# Patient Record
Sex: Female | Born: 1955 | Race: Black or African American | Hispanic: No | Marital: Single | Smoking: Never smoker
Health system: Southern US, Community
[De-identification: ages and names within clinical notes are randomized; demographics above are authoritative.]

## PROBLEM LIST (undated history)

## (undated) DIAGNOSIS — D219 Benign neoplasm of connective and other soft tissue, unspecified: Secondary | ICD-10-CM

## (undated) DIAGNOSIS — R7303 Prediabetes: Secondary | ICD-10-CM

## (undated) DIAGNOSIS — E785 Hyperlipidemia, unspecified: Secondary | ICD-10-CM

## (undated) DIAGNOSIS — B009 Herpesviral infection, unspecified: Secondary | ICD-10-CM

## (undated) DIAGNOSIS — R32 Unspecified urinary incontinence: Secondary | ICD-10-CM

## (undated) DIAGNOSIS — R42 Dizziness and giddiness: Secondary | ICD-10-CM

## (undated) DIAGNOSIS — N813 Complete uterovaginal prolapse: Secondary | ICD-10-CM

## (undated) DIAGNOSIS — R350 Frequency of micturition: Secondary | ICD-10-CM

## (undated) DIAGNOSIS — Z86010 Personal history of colonic polyps: Secondary | ICD-10-CM

## (undated) DIAGNOSIS — E119 Type 2 diabetes mellitus without complications: Secondary | ICD-10-CM

## (undated) DIAGNOSIS — N898 Other specified noninflammatory disorders of vagina: Secondary | ICD-10-CM

## (undated) HISTORY — DX: Benign neoplasm of connective and other soft tissue, unspecified: D21.9

## (undated) HISTORY — DX: Other specified noninflammatory disorders of vagina: N89.8

## (undated) HISTORY — PX: BREAST BIOPSY: SHX20

## (undated) HISTORY — DX: Prediabetes: R73.03

## (undated) HISTORY — DX: Hyperlipidemia, unspecified: E78.5

## (undated) HISTORY — PX: TUBAL LIGATION: SHX77

## (undated) HISTORY — DX: Dizziness and giddiness: R42

## (undated) HISTORY — DX: Complete uterovaginal prolapse: N81.3

## (undated) HISTORY — DX: Frequency of micturition: R35.0

## (undated) HISTORY — DX: Personal history of colonic polyps: Z86.010

## (undated) HISTORY — PX: WISDOM TOOTH EXTRACTION: SHX21

## (undated) HISTORY — DX: Herpesviral infection, unspecified: B00.9

## (undated) HISTORY — DX: Unspecified urinary incontinence: R32

---

## 2001-09-16 ENCOUNTER — Other Ambulatory Visit: Admission: RE | Admit: 2001-09-16 | Discharge: 2001-09-16 | Payer: Self-pay | Admitting: Obstetrics and Gynecology

## 2008-09-09 ENCOUNTER — Ambulatory Visit (HOSPITAL_COMMUNITY): Admission: RE | Admit: 2008-09-09 | Discharge: 2008-09-09 | Payer: Self-pay | Admitting: Family Medicine

## 2009-10-20 ENCOUNTER — Encounter: Payer: Self-pay | Admitting: Gastroenterology

## 2010-03-16 ENCOUNTER — Encounter: Payer: Self-pay | Admitting: Cardiology

## 2010-05-04 ENCOUNTER — Other Ambulatory Visit: Admission: RE | Admit: 2010-05-04 | Discharge: 2010-05-04 | Payer: Self-pay | Admitting: Obstetrics and Gynecology

## 2010-05-18 ENCOUNTER — Ambulatory Visit: Payer: Self-pay | Admitting: Cardiology

## 2010-05-18 DIAGNOSIS — R072 Precordial pain: Secondary | ICD-10-CM | POA: Insufficient documentation

## 2010-05-18 DIAGNOSIS — R9431 Abnormal electrocardiogram [ECG] [EKG]: Secondary | ICD-10-CM | POA: Insufficient documentation

## 2010-05-18 DIAGNOSIS — E785 Hyperlipidemia, unspecified: Secondary | ICD-10-CM | POA: Insufficient documentation

## 2010-05-30 ENCOUNTER — Telehealth (INDEPENDENT_AMBULATORY_CARE_PROVIDER_SITE_OTHER): Payer: Self-pay

## 2010-05-31 ENCOUNTER — Encounter (HOSPITAL_COMMUNITY)
Admission: RE | Admit: 2010-05-31 | Discharge: 2010-07-26 | Payer: Self-pay | Source: Home / Self Care | Attending: Cardiology | Admitting: Cardiology

## 2010-05-31 ENCOUNTER — Ambulatory Visit: Payer: Self-pay

## 2010-05-31 ENCOUNTER — Encounter: Payer: Self-pay | Admitting: Cardiology

## 2010-05-31 ENCOUNTER — Encounter: Payer: Self-pay | Admitting: *Deleted

## 2010-07-17 ENCOUNTER — Encounter: Payer: Self-pay | Admitting: Obstetrics and Gynecology

## 2010-07-26 NOTE — Assessment & Plan Note (Signed)
Summary: [facility]   Visit Type:  Initial Consult Primary Provider:  Dr. Tanya Nones  CC:  Abnormal ETT.  History of Present Illness: The patient presents for evaluation of an abnormal exercise treadmill test. She had this in late September to followup on dyspnea and chest discomfort. She has been getting this for about a month. She reports discomfort walking the 26 stairs up to the restroom at work.  She we'll get some mild 3/10 chest heaviness without radiation. She will be short of breath. She recovered totally. She says it has been a stable pattern for one year. She does not get nausea or vomiting or excessive diaphoresis. She does not have symptoms at rest. She denies any palpitations, presyncope or syncope.  Exercise test performed I did review. He has somewhat poor exercise tolerance but she did exercise for  6 minuteis 6 seconds.  She no ST-T during exercise but did develop diffuse T-wave inversions in recovery. There were no reported symptoms.  Current Medications (verified): 1)  Aspirin 81 Mg  Tabs (Aspirin) .Marland Kitchen.. 1 By Mouth Daily  Allergies (verified): No Known Drug Allergies  Past History:  Past Medical History: Hyperlipidemia.  Past Surgical History: Tubal ligation Breast biopsy  Family History: Her mother died at age 58 with complicaitons of HF Her father died of an aneurysm though she knows no details.  Social History: She is single. Works at [employer] Never smoked.  Review of Systems       As stated in the HPI and negative for all other systems.   Vital Signs:  Patient profile:   55 year old female Height:      65 inches Weight:      194 pounds BMI:     32.40 Pulse rate:   55 / minute Resp:     16 per minute BP sitting:   122 / 82  (right arm)  Vitals Entered By: Marrion Coy, CNA (May 18, 2010 9:29 AM)  Physical Exam  General:  Well developed, well nourished, in no acute distress. Head:  normocephalic and atraumatic Eyes:  PERRLA/EOM  intact; conjunctiva and lids normal. Mouth:  Teeth, gums and palate normal. Oral mucosa normal. Neck:  Neck supple, no JVD. No masses, thyromegaly or abnormal cervical nodes. Chest Fisher:  no deformities or breast masses noted Lungs:  Clear bilaterally to auscultation and percussion. Abdomen:  Bowel sounds positive; abdomen soft and non-tender without masses, organomegaly, or hernias noted. No hepatosplenomegaly. Msk:  Back normal, normal gait. Muscle strength and tone normal. Extremities:  No clubbing or cyanosis. Neurologic:  Alert and oriented x 3. Skin:  Intact without lesions or rashes. Cervical Nodes:  no significant adenopathy Axillary Nodes:  no significant adenopathy Inguinal Nodes:  no significant adenopathy Psych:  Normal affect.   Detailed Cardiovascular Exam  Neck    Carotids: Carotids full and equal bilaterally without bruits.      Neck Veins: Normal, no JVD.    Heart    Inspection: no deformities or lifts noted.      Palpation: normal PMI with no thrills palpable.      Auscultation: regular rate and rhythm, S1, S2 without murmurs, rubs, gallops, or clicks.    Vascular    Abdominal Aorta: no palpable masses, pulsations, or audible bruits.      Femoral Pulses: normal femoral pulses bilaterally.      Pedal Pulses: normal pedal pulses bilaterally.      Radial Pulses: normal radial pulses bilaterally.      Peripheral  Circulation: no clubbing, cyanosis, or edema noted with normal capillary refill.     EKG  Procedure date:  03/16/2010  Findings:      Sinus bradycardia, rate 56, axis within normal limits, intervals within normal limits, nonspecific biphasic T waves  Impression & Recommendations:  Problem # 1:  ABNORMAL STRESS ELECTROCARDIOGRAM (ICD-794.31) The patient had an abnormal stress test though I would interpret this as indeterminate with the baseline EKG abnormalities and absence of ST segment depression. It could not exclude obstructive coronary disease.  The pretest probability is moderately high. Therefore, stress perfusion imaging is indicated.  Problem # 2:  DYSLIPIDEMIA (ICD-272.4) I review recent lipid with an LDL of 171 and an HDL of 49. Triglycerides were 159. She has not been taking the prescribed statin though she was told to. I agree with this therapy and episodically and portions of this and diet.  Problem # 3:  OVERWEIGHT (ICD-278.02) We discussed the need for weight loss and I prescribed the [facility] Diet  Other Orders: Nuclear Stress Test (Nuc Stress Test)  Patient Instructions: 1)  Your physician recommends that you schedule a follow-up appointment as needed 2)  Your physician recommends that you continue on your current medications as directed. Please refer to the Current Medication list given to you today. 3)  Your physician has requested that you have an exercise stress myoview.  For further information please visit [email or web address]  Please follow instruction sheet, as given.  You will be called with these results and cardiac clearance will be based on the results.

## 2010-07-26 NOTE — Progress Notes (Signed)
Summary: Nuc. Pre-Procedure  Phone Note Outgoing Call Call back at [facility] Phone [phone]   Call placed by: Irean Hong, RN,  May 30, 2010 4:10 PM Summary of Call: Left message with information on Myoview Information Sheet (see scanned document for details).      Nuclear Med Background Indications for Stress Test: Evaluation for Ischemia   History: GXT  History Comments: 9/11 GXT: Abnormal with diffuse T-wave changes in recovery.  Symptoms: Chest Pain with Exertion, DOE, SOB    Nuclear Pre-Procedure Cardiac Risk Factors: Lipids Height (in): 65

## 2010-07-26 NOTE — Letter (Signed)
Summary: Pre Visit No Show Letter  [facility]  [address] [place], [place] [address]   Phone: [phone]  Fax: [phone]        October 20, 2009 MRN: [id number]    [facility] [address] [place], [place]  [address]    Dear Ms. Skillin,   We have been unable to reach you by phone concerning the pre-procedure visit that you missed on 10/20/09. For this reason,your procedure scheduled on 11/01/09 has been cancelled. Our scheduling staff will gladly assist you with rescheduling your appointments at a more convenient time. Please call our office at [phone] between the hours of 8:00am and 5:00pm, press option #2 to reach an appointment scheduler. Please consider updating your contact numbers at this time so that we can reach you by phone in the future with schedule changes or results.    Thank you,    Wyona Almas RN [facility]

## 2010-07-26 NOTE — Assessment & Plan Note (Signed)
Summary: Cardiology Nuclear Testing  Nuclear Med Background Indications for Stress Test: Evaluation for Ischemia   History: GXT  History Comments: 9/11 GXT: Abnormal with diffuse T-wave changes in recovery.  Symptoms: Chest Pain with Exertion, DOE, Palpitations, SOB    Nuclear Pre-Procedure Cardiac Risk Factors: Lipids Caffeine/Decaff Intake: None NPO After: 10:00 PM Lungs: clear IV 0.9% NS with Angio Cath: 22g     IV Site: R Hand IV Started by: Irean Hong, RN Chest Size (in) 40     Cup Size C     Height (in): 65 Weight (lb): 191 BMI: 31.90  Nuclear Med Study 1 or 2 day study:  1 day     Stress Test Type:  Stress Reading MD:  Cassell Clement, MD     Referring MD:  J.Hochrein Resting Radionuclide:  Technetium 43m Tetrofosmin     Resting Radionuclide Dose:  11 mCi  Stress Radionuclide:  Technetium 54m Tetrofosmin     Stress Radionuclide Dose:  33 mCi   Stress Protocol Exercise Time (min):  8:01 min     Max HR:  144 bpm     Predicted Max HR:  167 bpm  Max Systolic BP: 172 mm Hg     Percent Max HR:  86.23 %     METS: 10.10 Rate Pressure Product:  95284    Stress Test Technologist:  Milana Na, EMT-P     Nuclear Technologist:  Doyne Keel, CNMT  Rest Procedure  Myocardial perfusion imaging was performed at rest 45 minutes following the intravenous administration of Technetium 54m Tetrofosmin.  Stress Procedure  The patient exercised for 8:01. The patient stopped due to fatigue, sob, and denied any chest pain.  There were non specific ST-T wave changes and rare pacs/pat/pvc.  Technetium 61m Tetrofosmin was injected at peak exercise and myocardial perfusion imaging was performed after a brief delay.  QPS Raw Data Images:  Normal; no motion artifact; normal heart/lung ratio. Stress Images:  Normal homogeneous uptake in all areas of the myocardium. Rest Images:  Normal homogeneous uptake in all areas of the myocardium. Subtraction (SDS):  No evidence of ischemia.   Normal apical thinning on stress and rest. Transient Ischemic Dilatation:  1.09  (Normal <1.22)  Lung/Heart Ratio:  .36  (Normal <0.45)  Quantitative Gated Spect Images QGS EDV:  111 ml QGS ESV:  46 ml QGS EF:  58 % QGS cine images:  No Steers motion abnormalities.  Findings Normal nuclear study      Overall Impression  Exercise Capacity: Good exercise capacity. BP Response: Normal blood pressure response. Clinical Symptoms: No chest pain.  Dyspnea. ECG Impression: No significant ST segment change suggestive of ischemia.  One run of PAT  Overall Impression: Normal stress nuclear study.  Appended Document: Cardiology Nuclear Testing No further cardiac work up.  Appended Document: Cardiology Nuclear Testing left messge of normal results and to call back with any questions

## 2011-02-06 ENCOUNTER — Encounter: Payer: Self-pay | Admitting: Cardiology

## 2011-05-22 ENCOUNTER — Telehealth (INDEPENDENT_AMBULATORY_CARE_PROVIDER_SITE_OTHER): Payer: Self-pay | Admitting: *Deleted

## 2011-05-22 NOTE — Telephone Encounter (Signed)
Wrong patient

## 2011-10-19 ENCOUNTER — Other Ambulatory Visit: Payer: Self-pay | Admitting: Family Medicine

## 2011-10-19 DIAGNOSIS — R52 Pain, unspecified: Secondary | ICD-10-CM

## 2011-11-01 ENCOUNTER — Other Ambulatory Visit: Payer: Self-pay | Admitting: Family Medicine

## 2011-11-01 ENCOUNTER — Ambulatory Visit (HOSPITAL_COMMUNITY)
Admission: RE | Admit: 2011-11-01 | Discharge: 2011-11-01 | Disposition: A | Discharge status: Home / Self Care | Payer: BC Managed Care – PPO | Source: Ambulatory Visit | Attending: Family Medicine | Admitting: Family Medicine

## 2011-11-01 DIAGNOSIS — R52 Pain, unspecified: Secondary | ICD-10-CM

## 2011-11-01 DIAGNOSIS — Z1231 Encounter for screening mammogram for malignant neoplasm of breast: Secondary | ICD-10-CM | POA: Insufficient documentation

## 2014-06-22 ENCOUNTER — Ambulatory Visit: Payer: BC Managed Care – PPO | Admitting: Orthopedic Surgery

## 2014-10-20 ENCOUNTER — Other Ambulatory Visit: Payer: Self-pay | Admitting: Adult Health

## 2014-10-21 ENCOUNTER — Encounter: Payer: Self-pay | Admitting: Family

## 2014-10-28 ENCOUNTER — Other Ambulatory Visit (HOSPITAL_COMMUNITY)
Admission: RE | Admit: 2014-10-28 | Discharge: 2014-10-28 | Disposition: A | Discharge status: Home / Self Care | Payer: BLUE CROSS/BLUE SHIELD | Source: Ambulatory Visit | Attending: Adult Health | Admitting: Adult Health

## 2014-10-28 ENCOUNTER — Ambulatory Visit (INDEPENDENT_AMBULATORY_CARE_PROVIDER_SITE_OTHER): Payer: BLUE CROSS/BLUE SHIELD | Admitting: Adult Health

## 2014-10-28 ENCOUNTER — Encounter: Payer: Self-pay | Admitting: Adult Health

## 2014-10-28 VITALS — BP 120/80 | HR 56 | Ht 65.25 in | Wt 195.0 lb

## 2014-10-28 DIAGNOSIS — R350 Frequency of micturition: Secondary | ICD-10-CM

## 2014-10-28 DIAGNOSIS — Z1212 Encounter for screening for malignant neoplasm of rectum: Secondary | ICD-10-CM | POA: Diagnosis not present

## 2014-10-28 DIAGNOSIS — R32 Unspecified urinary incontinence: Secondary | ICD-10-CM

## 2014-10-28 DIAGNOSIS — Z01419 Encounter for gynecological examination (general) (routine) without abnormal findings: Secondary | ICD-10-CM

## 2014-10-28 DIAGNOSIS — L298 Other pruritus: Secondary | ICD-10-CM | POA: Diagnosis not present

## 2014-10-28 DIAGNOSIS — N813 Complete uterovaginal prolapse: Secondary | ICD-10-CM

## 2014-10-28 DIAGNOSIS — N898 Other specified noninflammatory disorders of vagina: Secondary | ICD-10-CM

## 2014-10-28 DIAGNOSIS — R319 Hematuria, unspecified: Secondary | ICD-10-CM

## 2014-10-28 DIAGNOSIS — N3946 Mixed incontinence: Secondary | ICD-10-CM | POA: Diagnosis not present

## 2014-10-28 DIAGNOSIS — Z1151 Encounter for screening for human papillomavirus (HPV): Secondary | ICD-10-CM | POA: Insufficient documentation

## 2014-10-28 HISTORY — DX: Unspecified urinary incontinence: R32

## 2014-10-28 HISTORY — DX: Other specified noninflammatory disorders of vagina: N89.8

## 2014-10-28 HISTORY — DX: Frequency of micturition: R35.0

## 2014-10-28 HISTORY — DX: Complete uterovaginal prolapse: N81.3

## 2014-10-28 LAB — POCT URINALYSIS DIPSTICK
GLUCOSE UA: NEGATIVE
LEUKOCYTES UA: NEGATIVE
NITRITE UA: NEGATIVE
Protein, UA: NEGATIVE

## 2014-10-28 LAB — HEMOCCULT GUIAC POC 1CARD (OFFICE): Fecal Occult Blood, POC: NEGATIVE

## 2014-10-28 MED ORDER — NYSTATIN-TRIAMCINOLONE 100000-0.1 UNIT/GM-% EX OINT: 1.0000 "application " | TOPICAL_OINTMENT | Freq: Two times a day (BID) | CUTANEOUS | Status: DC

## 2014-10-28 NOTE — Progress Notes (Signed)
Patient ID: Heather Leach, female   DOB: 11/26/1955, 59 y.o.   MRN: [id number] History of Present Illness: Heather Leach is a 59 year old black female in for well woman gyn exam with pap and complains of uterus coming out and urinary frequency and urinary incontinence, has pressure.Has vaginal itch near clitoris. PCP is Dr Laurance Flatten in [place].  Current Medications, Allergies, Past Medical History, Past Surgical History, Family History and Social History were reviewed in [employer] record.     Review of Systems: Patient denies any headaches, hearing loss, fatigue, blurred vision, shortness of breath, chest pain, abdominal pain, problems with bowel movements. No joint pain or mood swings.See HPI for positives, she is not had sex since 2008.She went to dentist last week needs root canal.    Physical Exam:BP 120/80 mmHg  Pulse 56  Ht 5' 5.25" (1.657 m)  Wt 195 lb (88.451 kg)  BMI 32.21 kg/m2urine dipstick trace blood  BP was 160/90 on arrival. General:  Well developed, well nourished, no acute distress Skin:  Warm and dry Neck:  Midline trachea, normal thyroid, good ROM, no lymphadenopathy Lungs; Clear to auscultation bilaterally Breast:  No dominant palpable mass, retraction, or nipple discharge Cardiovascular: Regular rate and rhythm Abdomen:  Soft, non tender, no hepatosplenomegaly Pelvic:  External genitalia is normal in appearance, no lesions.  The vagina has decreased color, moisture and rugae and uterus has completely prolapsed. Urethra has no lesions or masses. The cervix is smooth and dry, pap with HPV performed.  Uterus is felt to be normal size, shape, and contour.  No adnexal masses or tenderness noted.Bladder is non tender, no masses felt. Rectal: Good sphincter tone, no polyps, or hemorrhoids felt.  Hemoccult negative. Extremities/musculoskeletal:  No swelling or varicosities noted, no clubbing or cyanosis Psych:  No mood changes, alert and cooperative,seems happy I  dicussed with her I don't think pessary will help will probably need hysterectomy.  Impression: Well woman gyn exam with pap Complete uterine prolapse Urinary frequency Urinary incontinence, mixed Vaginal itch Hematuria    Plan: Rx mycolog ointment use bid prn to area that itches UA C&S sent Return in 1 week to talk surgery with Dr Elonda Husky Physical in 1 year Mammogram yearly Colonoscopy advised Labs with PCP

## 2014-10-28 NOTE — Patient Instructions (Signed)
Physical in 1 year Mammogram yearly Labs with PCP Colonoscopy advised

## 2014-10-29 LAB — URINALYSIS, ROUTINE W REFLEX MICROSCOPIC
Bilirubin, UA: NEGATIVE
Glucose, UA: NEGATIVE
Ketones, UA: NEGATIVE
LEUKOCYTES UA: NEGATIVE
NITRITE UA: NEGATIVE
PH UA: 6 (ref 5.0–7.5)
Protein, UA: NEGATIVE
RBC, UA: NEGATIVE
SPEC GRAV UA: 1.023 (ref 1.005–1.030)
Urobilinogen, Ur: 0.2 mg/dL (ref 0.2–1.0)

## 2014-10-29 LAB — CYTOLOGY - PAP

## 2014-10-30 LAB — URINE CULTURE

## 2014-11-06 ENCOUNTER — Ambulatory Visit: Payer: BLUE CROSS/BLUE SHIELD | Admitting: Obstetrics & Gynecology

## 2014-11-10 ENCOUNTER — Encounter: Payer: Self-pay | Admitting: Family

## 2014-11-12 ENCOUNTER — Encounter: Payer: Self-pay | Admitting: Physician Assistant

## 2014-11-12 ENCOUNTER — Ambulatory Visit (INDEPENDENT_AMBULATORY_CARE_PROVIDER_SITE_OTHER): Payer: BLUE CROSS/BLUE SHIELD | Admitting: Physician Assistant

## 2014-11-12 VITALS — BP 126/76 | HR 52 | Temp 97.6°F | Ht 65.25 in | Wt 196.6 lb

## 2014-11-12 DIAGNOSIS — Z0000 Encounter for general adult medical examination without abnormal findings: Secondary | ICD-10-CM

## 2014-11-12 NOTE — Progress Notes (Signed)
Subjective:     Patient ID: Heather Leach, female   DOB: [birth year], 59 y.o.   MRN: [id number]  HPI Pt here for PE She has regular pap and breast exam through her Gyn and this was done earlier this month Pt denies having any complaints at this time She has intermit problems with knee pain which has been told was arthritis Pt is on no regular meds OTC or RX  Review of Systems  Constitutional: Negative.   HENT: Negative.   Eyes: Negative.   Respiratory: Negative.   Cardiovascular: Negative.   Gastrointestinal: Negative.   Endocrine: Negative.   Genitourinary: Negative.   Neurological: Negative.   Hematological: Negative.   Psychiatric/Behavioral: Negative.        Objective:   Physical Exam  Constitutional: She is oriented to person, place, and time. She appears well-developed and well-nourished.  HENT:  Head: Normocephalic and atraumatic.  Right Ear: External ear normal.  Left Ear: External ear normal.  Mouth/Throat: Oropharynx is clear and moist. No oropharyngeal exudate.  Eyes: Conjunctivae and EOM are normal. Pupils are equal, round, and reactive to light.  Neck: Normal range of motion. Neck supple. No JVD present. No tracheal deviation present. No thyromegaly present.  No bruits  Cardiovascular: Normal rate, regular rhythm, normal heart sounds and intact distal pulses.   No murmur heard. Pulmonary/Chest: Effort normal and breath sounds normal.  Abdominal: Soft. Bowel sounds are normal. She exhibits no distension and no mass. There is no tenderness.  Musculoskeletal: Normal range of motion. She exhibits no edema or tenderness.  Lymphadenopathy:    She has no cervical adenopathy.  Neurological: She is alert and oriented to person, place, and time. She has normal reflexes.  Skin: Skin is warm and dry.  Psychiatric: She has a normal mood and affect. Her behavior is normal. Judgment and thought content normal.  Nursing note and vitals reviewed.      Assessment:      Physical exam - Plan: [id number], Lipid panel      Plan:     Will inform of lab results Discussed diet and increasing her exercise besides just at work Reviewed S/S of loss of BS control given strong FH She is overdue for colonoscopy but wants to wait until after her hysterectomy scheduled for this yr Pt needing eye exam Dental visits are up to date She has regular breast and gyn screening through her gyn

## 2014-11-12 NOTE — Patient Instructions (Signed)
Preventive Care for Adults A healthy lifestyle and preventive care can promote health and wellness. Preventive health guidelines for women include the following key practices.  A routine yearly physical is a good way to check with your health care provider about your health and preventive screening. It is a chance to share any concerns and updates on your health and to receive a thorough exam.  Visit your dentist for a routine exam and preventive care every 6 months. Brush your teeth twice a day and floss once a day. Good oral hygiene prevents tooth decay and gum disease.  The frequency of eye exams is based on your age, health, family medical history, use of contact lenses, and other factors. Follow your health care provider's recommendations for frequency of eye exams.  Eat a healthy diet. Foods like vegetables, fruits, whole grains, low-fat dairy products, and lean protein foods contain the nutrients you need without too many calories. Decrease your intake of foods high in solid fats, added sugars, and salt. Eat the right amount of calories for you.Get information about a proper diet from your health care provider, if necessary.  Regular physical exercise is one of the most important things you can do for your health. Most adults should get at least 150 minutes of moderate-intensity exercise (any activity that increases your heart rate and causes you to sweat) each week. In addition, most adults need muscle-strengthening exercises on 2 or more days a week.  Maintain a healthy weight. The body mass index (BMI) is a screening tool to identify possible weight problems. It provides an estimate of body fat based on height and weight. Your health care provider can find your BMI and can help you achieve or maintain a healthy weight.For adults 20 years and older:  A BMI below 18.5 is considered underweight.  A BMI of 18.5 to 24.9 is normal.  A BMI of 25 to 29.9 is considered overweight.  A BMI of  30 and above is considered obese.  Maintain normal blood lipids and cholesterol levels by exercising and minimizing your intake of saturated fat. Eat a balanced diet with plenty of fruit and vegetables. Blood tests for lipids and cholesterol should begin at age 76 and be repeated every 5 years. If your lipid or cholesterol levels are high, you are over 50, or you are at high risk for heart disease, you may need your cholesterol levels checked more frequently.Ongoing high lipid and cholesterol levels should be treated with medicines if diet and exercise are not working.  If you smoke, find out from your health care provider how to quit. If you do not use tobacco, do not start.  Lung cancer screening is recommended for adults aged 22-80 years who are at high risk for developing lung cancer because of a history of smoking. A yearly low-dose CT scan of the lungs is recommended for people who have at least a 30-pack-year history of smoking and are a current smoker or have quit within the past 15 years. A pack year of smoking is smoking an average of 1 pack of cigarettes a day for 1 year (for example: 1 pack a day for 30 years or 2 packs a day for 15 years). Yearly screening should continue until the smoker has stopped smoking for at least 15 years. Yearly screening should be stopped for people who develop a health problem that would prevent them from having lung cancer treatment.  If you are pregnant, do not drink alcohol. If you are breastfeeding,  be very cautious about drinking alcohol. If you are not pregnant and choose to drink alcohol, do not have more than 1 drink per day. One drink is considered to be 12 ounces (355 mL) of beer, 5 ounces (148 mL) of wine, or 1.5 ounces (44 mL) of liquor.  Avoid use of street drugs. Do not share needles with anyone. Ask for help if you need support or instructions about stopping the use of drugs.  High blood pressure causes heart disease and increases the risk of  stroke. Your blood pressure should be checked at least every 1 to 2 years. Ongoing high blood pressure should be treated with medicines if weight loss and exercise do not work.  If you are 75-52 years old, ask your health care provider if you should take aspirin to prevent strokes.  Diabetes screening involves taking a blood sample to check your fasting blood sugar level. This should be done once every 3 years, after age 15, if you are within normal weight and without risk factors for diabetes. Testing should be considered at a younger age or be carried out more frequently if you are overweight and have at least 1 risk factor for diabetes.  Breast cancer screening is essential preventive care for women. You should practice "breast self-awareness." This means understanding the normal appearance and feel of your breasts and may include breast self-examination. Any changes detected, no matter how small, should be reported to a health care provider. Women in their 58s and 30s should have a clinical breast exam (CBE) by a health care provider as part of a regular health exam every 1 to 3 years. After age 16, women should have a CBE every year. Starting at age 53, women should consider having a mammogram (breast X-ray test) every year. Women who have a family history of breast cancer should talk to their health care provider about genetic screening. Women at a high risk of breast cancer should talk to their health care providers about having an MRI and a mammogram every year.  Breast cancer gene (BRCA)-related cancer risk assessment is recommended for women who have family members with BRCA-related cancers. BRCA-related cancers include breast, ovarian, tubal, and peritoneal cancers. Having family members with these cancers may be associated with an increased risk for harmful changes (mutations) in the breast cancer genes BRCA1 and BRCA2. Results of the assessment will determine the need for genetic counseling and  BRCA1 and BRCA2 testing.  Routine pelvic exams to screen for cancer are no longer recommended for nonpregnant women who are considered low risk for cancer of the pelvic organs (ovaries, uterus, and vagina) and who do not have symptoms. Ask your health care provider if a screening pelvic exam is right for you.  If you have had past treatment for cervical cancer or a condition that could lead to cancer, you need Pap tests and screening for cancer for at least 20 years after your treatment. If Pap tests have been discontinued, your risk factors (such as having a new sexual partner) need to be reassessed to determine if screening should be resumed. Some women have medical problems that increase the chance of getting cervical cancer. In these cases, your health care provider may recommend more frequent screening and Pap tests.  The HPV test is an additional test that may be used for cervical cancer screening. The HPV test looks for the virus that can cause the cell changes on the cervix. The cells collected during the Pap test can be  tested for HPV. The HPV test could be used to screen women aged 30 years and older, and should be used in women of any age who have unclear Pap test results. After the age of 30, women should have HPV testing at the same frequency as a Pap test.  Colorectal cancer can be detected and often prevented. Most routine colorectal cancer screening begins at the age of 50 years and continues through age 75 years. However, your health care provider may recommend screening at an earlier age if you have risk factors for colon cancer. On a yearly basis, your health care provider may provide home test kits to check for hidden blood in the stool. Use of a small camera at the end of a tube, to directly examine the colon (sigmoidoscopy or colonoscopy), can detect the earliest forms of colorectal cancer. Talk to your health care provider about this at age 50, when routine screening begins. Direct  exam of the colon should be repeated every 5-10 years through age 75 years, unless early forms of pre-cancerous polyps or small growths are found.  People who are at an increased risk for hepatitis B should be screened for this virus. You are considered at high risk for hepatitis B if:  You were born in a country where hepatitis B occurs often. Talk with your health care provider about which countries are considered high risk.  Your parents were born in a high-risk country and you have not received a shot to protect against hepatitis B (hepatitis B vaccine).  You have HIV or AIDS.  You use needles to inject street drugs.  You live with, or have sex with, someone who has hepatitis B.  You get hemodialysis treatment.  You take certain medicines for conditions like cancer, organ transplantation, and autoimmune conditions.  Hepatitis C blood testing is recommended for all people born from 1945 through 1965 and any individual with known risks for hepatitis C.  Practice safe sex. Use condoms and avoid high-risk sexual practices to reduce the spread of sexually transmitted infections (STIs). STIs include gonorrhea, chlamydia, syphilis, trichomonas, herpes, HPV, and human immunodeficiency virus (HIV). Herpes, HIV, and HPV are viral illnesses that have no cure. They can result in disability, cancer, and death.  You should be screened for sexually transmitted illnesses (STIs) including gonorrhea and chlamydia if:  You are sexually active and are younger than 24 years.  You are older than 24 years and your health care provider tells you that you are at risk for this type of infection.  Your sexual activity has changed since you were last screened and you are at an increased risk for chlamydia or gonorrhea. Ask your health care provider if you are at risk.  If you are at risk of being infected with HIV, it is recommended that you take a prescription medicine daily to prevent HIV infection. This is  called preexposure prophylaxis (PrEP). You are considered at risk if:  You are a heterosexual woman, are sexually active, and are at increased risk for HIV infection.  You take drugs by injection.  You are sexually active with a partner who has HIV.  Talk with your health care provider about whether you are at high risk of being infected with HIV. If you choose to begin PrEP, you should first be tested for HIV. You should then be tested every 3 months for as long as you are taking PrEP.  Osteoporosis is a disease in which the bones lose minerals and strength   with aging. This can result in serious bone fractures or breaks. The risk of osteoporosis can be identified using a bone density scan. Women ages 65 years and over and women at risk for fractures or osteoporosis should discuss screening with their health care providers. Ask your health care provider whether you should take a calcium supplement or vitamin D to reduce the rate of osteoporosis.  Menopause can be associated with physical symptoms and risks. Hormone replacement therapy is available to decrease symptoms and risks. You should talk to your health care provider about whether hormone replacement therapy is right for you.  Use sunscreen. Apply sunscreen liberally and repeatedly throughout the day. You should seek shade when your shadow is shorter than you. Protect yourself by wearing long sleeves, pants, a wide-brimmed hat, and sunglasses year round, whenever you are outdoors.  Once a month, do a whole body skin exam, using a mirror to look at the skin on your back. Tell your health care provider of new moles, moles that have irregular borders, moles that are larger than a pencil eraser, or moles that have changed in shape or color.  Stay current with required vaccines (immunizations).  Influenza vaccine. All adults should be immunized every year.  Tetanus, diphtheria, and acellular pertussis (Td, Tdap) vaccine. Pregnant women should  receive 1 dose of Tdap vaccine during each pregnancy. The dose should be obtained regardless of the length of time since the last dose. Immunization is preferred during the 27th-36th week of gestation. An adult who has not previously received Tdap or who does not know her vaccine status should receive 1 dose of Tdap. This initial dose should be followed by tetanus and diphtheria toxoids (Td) booster doses every 10 years. Adults with an unknown or incomplete history of completing a 3-dose immunization series with Td-containing vaccines should begin or complete a primary immunization series including a Tdap dose. Adults should receive a Td booster every 10 years.  Varicella vaccine. An adult without evidence of immunity to varicella should receive 2 doses or a second dose if she has previously received 1 dose. Pregnant females who do not have evidence of immunity should receive the first dose after pregnancy. This first dose should be obtained before leaving the health care facility. The second dose should be obtained 4-8 weeks after the first dose.  Human papillomavirus (HPV) vaccine. Females aged 13-26 years who have not received the vaccine previously should obtain the 3-dose series. The vaccine is not recommended for use in pregnant females. However, pregnancy testing is not needed before receiving a dose. If a female is found to be pregnant after receiving a dose, no treatment is needed. In that case, the remaining doses should be delayed until after the pregnancy. Immunization is recommended for any person with an immunocompromised condition through the age of 26 years if she did not get any or all doses earlier. During the 3-dose series, the second dose should be obtained 4-8 weeks after the first dose. The third dose should be obtained 24 weeks after the first dose and 16 weeks after the second dose.  Zoster vaccine. One dose is recommended for adults aged 60 years or older unless certain conditions are  present.  Measles, mumps, and rubella (MMR) vaccine. Adults born before 1957 generally are considered immune to measles and mumps. Adults born in 1957 or later should have 1 or more doses of MMR vaccine unless there is a contraindication to the vaccine or there is laboratory evidence of immunity to   each of the three diseases. A routine second dose of MMR vaccine should be obtained at least 28 days after the first dose for students attending postsecondary schools, health care workers, or international travelers. People who received inactivated measles vaccine or an unknown type of measles vaccine during 1963-1967 should receive 2 doses of MMR vaccine. People who received inactivated mumps vaccine or an unknown type of mumps vaccine before 1979 and are at high risk for mumps infection should consider immunization with 2 doses of MMR vaccine. For females of childbearing age, rubella immunity should be determined. If there is no evidence of immunity, females who are not pregnant should be vaccinated. If there is no evidence of immunity, females who are pregnant should delay immunization until after pregnancy. Unvaccinated health care workers born before 1957 who lack laboratory evidence of measles, mumps, or rubella immunity or laboratory confirmation of disease should consider measles and mumps immunization with 2 doses of MMR vaccine or rubella immunization with 1 dose of MMR vaccine.  Pneumococcal 13-valent conjugate (PCV13) vaccine. When indicated, a person who is uncertain of her immunization history and has no record of immunization should receive the PCV13 vaccine. An adult aged 19 years or older who has certain medical conditions and has not been previously immunized should receive 1 dose of PCV13 vaccine. This PCV13 should be followed with a dose of pneumococcal polysaccharide (PPSV23) vaccine. The PPSV23 vaccine dose should be obtained at least 8 weeks after the dose of PCV13 vaccine. An adult aged 19  years or older who has certain medical conditions and previously received 1 or more doses of PPSV23 vaccine should receive 1 dose of PCV13. The PCV13 vaccine dose should be obtained 1 or more years after the last PPSV23 vaccine dose.  Pneumococcal polysaccharide (PPSV23) vaccine. When PCV13 is also indicated, PCV13 should be obtained first. All adults aged 65 years and older should be immunized. An adult younger than age 65 years who has certain medical conditions should be immunized. Any person who resides in a nursing home or long-term care facility should be immunized. An adult smoker should be immunized. People with an immunocompromised condition and certain other conditions should receive both PCV13 and PPSV23 vaccines. People with human immunodeficiency virus (HIV) infection should be immunized as soon as possible after diagnosis. Immunization during chemotherapy or radiation therapy should be avoided. Routine use of PPSV23 vaccine is not recommended for American Indians, [place] Natives, or people younger than 65 years unless there are medical conditions that require PPSV23 vaccine. When indicated, people who have unknown immunization and have no record of immunization should receive PPSV23 vaccine. One-time revaccination 5 years after the first dose of PPSV23 is recommended for people aged 19-64 years who have chronic kidney failure, nephrotic syndrome, asplenia, or immunocompromised conditions. People who received 1-2 doses of PPSV23 before age 65 years should receive another dose of PPSV23 vaccine at age 65 years or later if at least 5 years have passed since the previous dose. Doses of PPSV23 are not needed for people immunized with PPSV23 at or after age 65 years.  Meningococcal vaccine. Adults with asplenia or persistent complement component deficiencies should receive 2 doses of quadrivalent meningococcal conjugate (MenACWY-D) vaccine. The doses should be obtained at least 2 months apart.  Microbiologists working with certain meningococcal bacteria, military recruits, people at risk during an outbreak, and people who travel to or live in countries with a high rate of meningitis should be immunized. A first-year college student up through age   21 years who is living in a residence hall should receive a dose if she did not receive a dose on or after her 16th birthday. Adults who have certain high-risk conditions should receive one or more doses of vaccine.  Hepatitis A vaccine. Adults who wish to be protected from this disease, have certain high-risk conditions, work with hepatitis A-infected animals, work in hepatitis A research labs, or travel to or work in countries with a high rate of hepatitis A should be immunized. Adults who were previously unvaccinated and who anticipate close contact with an international adoptee during the first 60 days after arrival in the [place] from a country with a high rate of hepatitis A should be immunized.  Hepatitis B vaccine. Adults who wish to be protected from this disease, have certain high-risk conditions, may be exposed to blood or other infectious body fluids, are household contacts or sex partners of hepatitis B positive people, are clients or workers in certain care facilities, or travel to or work in countries with a high rate of hepatitis B should be immunized.  Haemophilus influenzae type b (Hib) vaccine. A previously unvaccinated person with asplenia or sickle cell disease or having a scheduled splenectomy should receive 1 dose of Hib vaccine. Regardless of previous immunization, a recipient of a hematopoietic stem cell transplant should receive a 3-dose series 6-12 months after her successful transplant. Hib vaccine is not recommended for adults with HIV infection. Preventive Services / Frequency Ages 64 to 68 years  Blood pressure check.** / Every 1 to 2 years.  Lipid and cholesterol check.** / Every 5 years beginning at age  22.  Clinical breast exam.** / Every 3 years for women in their 88s and 53s.  BRCA-related cancer risk assessment.** / For women who have family members with a BRCA-related cancer (breast, ovarian, tubal, or peritoneal cancers).  Pap test.** / Every 2 years from ages 90 through 51. Every 3 years starting at age 21 through age 56 or 3 with a history of 3 consecutive normal Pap tests.  HPV screening.** / Every 3 years from ages 24 through ages 1 to 46 with a history of 3 consecutive normal Pap tests.  Hepatitis C blood test.** / For any individual with known risks for hepatitis C.  Skin self-exam. / Monthly.  Influenza vaccine. / Every year.  Tetanus, diphtheria, and acellular pertussis (Tdap, Td) vaccine.** / Consult your health care provider. Pregnant women should receive 1 dose of Tdap vaccine during each pregnancy. 1 dose of Td every 10 years.  Varicella vaccine.** / Consult your health care provider. Pregnant females who do not have evidence of immunity should receive the first dose after pregnancy.  HPV vaccine. / 3 doses over 6 months, if 72 and younger. The vaccine is not recommended for use in pregnant females. However, pregnancy testing is not needed before receiving a dose.  Measles, mumps, rubella (MMR) vaccine.** / You need at least 1 dose of MMR if you were born in 1957 or later. You may also need a 2nd dose. For females of childbearing age, rubella immunity should be determined. If there is no evidence of immunity, females who are not pregnant should be vaccinated. If there is no evidence of immunity, females who are pregnant should delay immunization until after pregnancy.  Pneumococcal 13-valent conjugate (PCV13) vaccine.** / Consult your health care provider.  Pneumococcal polysaccharide (PPSV23) vaccine.** / 1 to 2 doses if you smoke cigarettes or if you have certain conditions.  Meningococcal vaccine.** /  1 dose if you are age 19 to 21 years and a first-year college  student living in a residence hall, or have one of several medical conditions, you need to get vaccinated against meningococcal disease. You may also need additional booster doses.  Hepatitis A vaccine.** / Consult your health care provider.  Hepatitis B vaccine.** / Consult your health care provider.  Haemophilus influenzae type b (Hib) vaccine.** / Consult your health care provider. Ages 40 to 64 years  Blood pressure check.** / Every 1 to 2 years.  Lipid and cholesterol check.** / Every 5 years beginning at age 20 years.  Lung cancer screening. / Every year if you are aged 55-80 years and have a 30-pack-year history of smoking and currently smoke or have quit within the past 15 years. Yearly screening is stopped once you have quit smoking for at least 15 years or develop a health problem that would prevent you from having lung cancer treatment.  Clinical breast exam.** / Every year after age 40 years.  BRCA-related cancer risk assessment.** / For women who have family members with a BRCA-related cancer (breast, ovarian, tubal, or peritoneal cancers).  Mammogram.** / Every year beginning at age 40 years and continuing for as long as you are in good health. Consult with your health care provider.  Pap test.** / Every 3 years starting at age 30 years through age 65 or 70 years with a history of 3 consecutive normal Pap tests.  HPV screening.** / Every 3 years from ages 30 years through ages 65 to 70 years with a history of 3 consecutive normal Pap tests.  Fecal occult blood test (FOBT) of stool. / Every year beginning at age 50 years and continuing until age 75 years. You may not need to do this test if you get a colonoscopy every 10 years.  Flexible sigmoidoscopy or colonoscopy.** / Every 5 years for a flexible sigmoidoscopy or every 10 years for a colonoscopy beginning at age 50 years and continuing until age 75 years.  Hepatitis C blood test.** / For all people born from 1945 through  1965 and any individual with known risks for hepatitis C.  Skin self-exam. / Monthly.  Influenza vaccine. / Every year.  Tetanus, diphtheria, and acellular pertussis (Tdap/Td) vaccine.** / Consult your health care provider. Pregnant women should receive 1 dose of Tdap vaccine during each pregnancy. 1 dose of Td every 10 years.  Varicella vaccine.** / Consult your health care provider. Pregnant females who do not have evidence of immunity should receive the first dose after pregnancy.  Zoster vaccine.** / 1 dose for adults aged 60 years or older.  Measles, mumps, rubella (MMR) vaccine.** / You need at least 1 dose of MMR if you were born in 1957 or later. You may also need a 2nd dose. For females of childbearing age, rubella immunity should be determined. If there is no evidence of immunity, females who are not pregnant should be vaccinated. If there is no evidence of immunity, females who are pregnant should delay immunization until after pregnancy.  Pneumococcal 13-valent conjugate (PCV13) vaccine.** / Consult your health care provider.  Pneumococcal polysaccharide (PPSV23) vaccine.** / 1 to 2 doses if you smoke cigarettes or if you have certain conditions.  Meningococcal vaccine.** / Consult your health care provider.  Hepatitis A vaccine.** / Consult your health care provider.  Hepatitis B vaccine.** / Consult your health care provider.  Haemophilus influenzae type b (Hib) vaccine.** / Consult your health care provider. Ages 65   years and over  Blood pressure check.** / Every 1 to 2 years.  Lipid and cholesterol check.** / Every 5 years beginning at age 22 years.  Lung cancer screening. / Every year if you are aged 73-80 years and have a 30-pack-year history of smoking and currently smoke or have quit within the past 15 years. Yearly screening is stopped once you have quit smoking for at least 15 years or develop a health problem that would prevent you from having lung cancer  treatment.  Clinical breast exam.** / Every year after age 4 years.  BRCA-related cancer risk assessment.** / For women who have family members with a BRCA-related cancer (breast, ovarian, tubal, or peritoneal cancers).  Mammogram.** / Every year beginning at age 40 years and continuing for as long as you are in good health. Consult with your health care provider.  Pap test.** / Every 3 years starting at age 9 years through age 34 or 91 years with 3 consecutive normal Pap tests. Testing can be stopped between 65 and 70 years with 3 consecutive normal Pap tests and no abnormal Pap or HPV tests in the past 10 years.  HPV screening.** / Every 3 years from ages 57 years through ages 64 or 45 years with a history of 3 consecutive normal Pap tests. Testing can be stopped between 65 and 70 years with 3 consecutive normal Pap tests and no abnormal Pap or HPV tests in the past 10 years.  Fecal occult blood test (FOBT) of stool. / Every year beginning at age 15 years and continuing until age 17 years. You may not need to do this test if you get a colonoscopy every 10 years.  Flexible sigmoidoscopy or colonoscopy.** / Every 5 years for a flexible sigmoidoscopy or every 10 years for a colonoscopy beginning at age 86 years and continuing until age 71 years.  Hepatitis C blood test.** / For all people born from [birth year] through 1964 and any individual with known risks for hepatitis C.  Osteoporosis screening.** / A one-time screening for women ages 83 years and over and women at risk for fractures or osteoporosis.  Skin self-exam. / Monthly.  Influenza vaccine. / Every year.  Tetanus, diphtheria, and acellular pertussis (Tdap/Td) vaccine.** / 1 dose of Td every 10 years.  Varicella vaccine.** / Consult your health care provider.  Zoster vaccine.** / 1 dose for adults aged 61 years or older.  Pneumococcal 13-valent conjugate (PCV13) vaccine.** / Consult your health care provider.  Pneumococcal  polysaccharide (PPSV23) vaccine.** / 1 dose for all adults aged 28 years and older.  Meningococcal vaccine.** / Consult your health care provider.  Hepatitis A vaccine.** / Consult your health care provider.  Hepatitis B vaccine.** / Consult your health care provider.  Haemophilus influenzae type b (Hib) vaccine.** / Consult your health care provider. ** Family history and personal history of risk and conditions may change your health care provider's recommendations. Document Released: 08/08/2001 Document Revised: 10/27/2013 Document Reviewed: 11/07/2010 [facility] Patient Information 2015 [place], [place]. This information is not intended to replace advice given to you by your health care provider. Make sure you discuss any questions you have with your health care provider.

## 2014-11-13 LAB — CMP14+EGFR
A/G RATIO: 1.4 (ref 1.1–2.5)
ALBUMIN: 4.2 g/dL (ref 3.5–5.5)
ALT: 8 IU/L (ref 0–32)
AST: 13 IU/L (ref 0–40)
Alkaline Phosphatase: 77 IU/L (ref 39–117)
BUN/Creatinine Ratio: 12 (ref 9–23)
BUN: 14 mg/dL (ref 6–24)
Bilirubin Total: 0.5 mg/dL (ref 0.0–1.2)
CALCIUM: 9.8 mg/dL (ref 8.7–10.2)
CO2: 23 mmol/L (ref 18–29)
CREATININE: 1.14 mg/dL — AB (ref 0.57–1.00)
Chloride: 102 mmol/L (ref 97–108)
GFR calc Af Amer: 62 mL/min/{1.73_m2} (ref >59)
GFR, EST NON AFRICAN AMERICAN: 54 mL/min/{1.73_m2} — AB (ref >59)
GLOBULIN, TOTAL: 2.9 g/dL (ref 1.5–4.5)
Glucose: 89 mg/dL (ref 65–99)
Potassium: 4.8 mmol/L (ref 3.5–5.2)
SODIUM: 141 mmol/L (ref 134–144)
Total Protein: 7.1 g/dL (ref 6.0–8.5)

## 2014-11-13 LAB — LIPID PANEL
CHOL/HDL RATIO: 5.7 ratio — AB (ref 0.0–4.4)
Cholesterol, Total: 241 mg/dL — ABNORMAL HIGH (ref 100–199)
HDL: 42 mg/dL (ref >39)
LDL Calculated: 160 mg/dL — ABNORMAL HIGH (ref 0–99)
Triglycerides: 194 mg/dL — ABNORMAL HIGH (ref 0–149)
VLDL CHOLESTEROL CAL: 39 mg/dL (ref 5–40)

## 2015-06-07 ENCOUNTER — Ambulatory Visit: Payer: BLUE CROSS/BLUE SHIELD | Admitting: Pediatrics

## 2015-06-11 ENCOUNTER — Ambulatory Visit (INDEPENDENT_AMBULATORY_CARE_PROVIDER_SITE_OTHER): Payer: BLUE CROSS/BLUE SHIELD | Admitting: Pediatrics

## 2015-06-11 ENCOUNTER — Encounter: Payer: Self-pay | Admitting: Pediatrics

## 2015-06-11 VITALS — BP 158/78 | HR 47 | Temp 97.0°F | Ht 65.25 in | Wt 190.4 lb

## 2015-06-11 DIAGNOSIS — Z1331 Encounter for screening for depression: Secondary | ICD-10-CM

## 2015-06-11 DIAGNOSIS — R631 Polydipsia: Secondary | ICD-10-CM

## 2015-06-11 DIAGNOSIS — B354 Tinea corporis: Secondary | ICD-10-CM

## 2015-06-11 DIAGNOSIS — R030 Elevated blood-pressure reading, without diagnosis of hypertension: Secondary | ICD-10-CM | POA: Diagnosis not present

## 2015-06-11 DIAGNOSIS — Z6831 Body mass index (BMI) 31.0-31.9, adult: Secondary | ICD-10-CM

## 2015-06-11 DIAGNOSIS — R350 Frequency of micturition: Secondary | ICD-10-CM

## 2015-06-11 DIAGNOSIS — Z1389 Encounter for screening for other disorder: Secondary | ICD-10-CM

## 2015-06-11 DIAGNOSIS — R7303 Prediabetes: Secondary | ICD-10-CM | POA: Diagnosis not present

## 2015-06-11 DIAGNOSIS — E785 Hyperlipidemia, unspecified: Secondary | ICD-10-CM

## 2015-06-11 DIAGNOSIS — IMO0001 Reserved for inherently not codable concepts without codable children: Secondary | ICD-10-CM

## 2015-06-11 LAB — POCT URINALYSIS DIPSTICK
Bilirubin, UA: NEGATIVE
GLUCOSE UA: NEGATIVE
Ketones, UA: NEGATIVE
Leukocytes, UA: NEGATIVE
NITRITE UA: NEGATIVE
PROTEIN UA: NEGATIVE
RBC UA: NEGATIVE
Spec Grav, UA: 1.01
UROBILINOGEN UA: NEGATIVE
pH, UA: 6.5

## 2015-06-11 LAB — POCT UA - MICROSCOPIC ONLY
BACTERIA, U MICROSCOPIC: NEGATIVE
Casts, Ur, LPF, POC: NEGATIVE
Crystals, Ur, HPF, POC: NEGATIVE
Mucus, UA: NEGATIVE
RBC, URINE, MICROSCOPIC: NEGATIVE
WBC, Ur, HPF, POC: NEGATIVE
Yeast, UA: NEGATIVE

## 2015-06-11 LAB — POCT GLYCOSYLATED HEMOGLOBIN (HGB A1C): Hemoglobin A1C: 6.1

## 2015-06-11 MED ORDER — CLOTRIMAZOLE 1 % EX OINT: 1.0000 "application " | TOPICAL_OINTMENT | Freq: Two times a day (BID) | CUTANEOUS | Status: DC

## 2015-06-11 NOTE — Progress Notes (Signed)
Subjective:    Patient ID: Heather Leach, female    DOB: 06/28/1955, 59 y.o.   MRN: [id number]  CC: Rash On neck; Wants Blood Work; Urinary Frequency; and Polydipsia   HPI: Heather Leach is a 59 y.o. female presenting for Rash On neck; Wants Blood Work; Urinary Frequency; and Polydipsia  Skin rash on neck and month has been bothering her, very itchy. Wears safety glasses every day Has had similar rash on her arms in the past Works with chemicals, wears safety goggles Feeling thirsty all the time, drinks 5 giant yeti cups in a day of mostly water, feels thisty all the time  Urinary urgency, polydipsia for about three weeks No fevers No dysuria Drinks  Many people in her family with diabetes  No chest pain or SOB. Was supposed to be working on lifestyle changes, increasing physical activity and eating better for her hyperlipidemia based on a prior visit with a provider. Pt has not been taking cholesterol medicine that was prescribed years ago by her cardiologist. No rashes   Depression screen [facility] 2/9 06/11/2015 11/12/2014  Decreased Interest 0 0  Down, Depressed, Hopeless 0 0  PHQ - 2 Score 0 0     Relevant past medical, surgical, family and social history reviewed and updated as indicated. Interim medical history since our last visit reviewed. Allergies and medications reviewed and updated.    ROS: Per HPI unless specifically indicated above  History  Smoking status  . Never Smoker   Smokeless tobacco  . Never Used    Past Medical History Patient Active Problem List   Diagnosis Date Noted  . BMI 31.0-31.9,adult 06/12/2015  . Elevated blood pressure 06/12/2015  . Tinea corporis 06/12/2015  . 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 Urinary frequency 10/28/2014  . Urinary incontinence 10/28/2014  . Vaginal itching 10/28/2014  . Hyperlipidemia 05/18/2010  . OVERWEIGHT 05/18/2010  . PRECORDIAL PAIN 05/18/2010  . ABNORMAL STRESS ELECTROCARDIOGRAM 05/18/2010     Medications: none currently    Objective:    BP 158/78 mmHg  Pulse 47  Temp(Src) 97 F (36.1 C) (Oral)  Ht 5' 5.25" (1.657 m)  Wt 190 lb 6.4 oz (86.365 kg)  BMI 31.46 kg/m2  Wt Readings from Last 3 Encounters:  06/11/15 190 lb 6.4 oz (86.365 kg)  11/12/14 196 lb 9.6 oz (89.177 kg)  10/28/14 195 lb (88.451 kg)     Gen: NAD, alert, cooperative with exam, NCAT EYES: EOMI, no scleral injection or icterus ENT:  TMs pearly gray b/l, OP without erythema LYMPH: no cervical LAD CV: NRRR, normal S1/S2, no murmur, distal pulses 2+ b/l Resp: CTABL, no wheezes, normal WOB Abd: +BS, soft, NTND. no guarding or organomegaly Ext: No edema, warm Neuro: Alert and oriented, strength equal b/l UE and LE, coordination grossly normal MSK: normal muscle bulk Skin: hyperpigmented plaque behind both ears b/l, small amount of scale, also less well defined plaques present on her neck     Assessment & Plan:    [name] was seen today for rash on neck, f/u multiple med problems, also with new urinary frequency and polydipsia.  Diagnoses and all orders for this visit:  Urinary frequency  Urinalysis normal, no glucose in urine. HgA1c 6.1. Pt drinking large amounts of water. Rec decreasing how much water intake she has, if still thristy will check urine electrolytes. -     BMP8+EGFR -     POCT glycosylated hemoglobin (Hb A1C) -     POCT UA - Microscopic  Only -     POCT urinalysis dipstick  Polydipsia  See above  Tinea corporis Confirmed with skin scraping showing hyphae under microscope. Let me know if not improving with below -     Clotrimazole 1 % OINT; Apply 1 application topically 2 (two) times daily.  Elevated blood pressure Recheck at home, bring numbers back to clinic.   BMI 31.0-31.9,adult Discussed lifestyle changes, decreasing soda intake, increase physical activity  Hyperlipidemia Will check lipid panel today. Pt had been trying to help cholesterol levels with dietary/activity  chnges but says she hasnt made much cahnge. Didn't do well on atorvastatin. Discussed if elevated can try alternate statin  Pre-diabetes Weight loss goal 5 lbs before next office visit with above changes.  Depression screen  negative  Follow up plan: Return in about 8 weeks (around 08/06/2015).  Heather Found, MD [facility] 06/11/2015, 10:12 AM

## 2015-06-11 NOTE — Patient Instructions (Signed)
Cream to rash twice a day to treat fungal infection, also called tinea corporis.  Try to regularly wash safety glasses, ear plugs

## 2015-06-12 DIAGNOSIS — E669 Obesity, unspecified: Secondary | ICD-10-CM | POA: Insufficient documentation

## 2015-06-12 DIAGNOSIS — B354 Tinea corporis: Secondary | ICD-10-CM | POA: Insufficient documentation

## 2015-06-12 LAB — LIPID PANEL
CHOLESTEROL TOTAL: 229 mg/dL — AB (ref 100–199)
Chol/HDL Ratio: 5.2 ratio units — ABNORMAL HIGH (ref 0.0–4.4)
HDL: 44 mg/dL (ref >39)
LDL Calculated: 156 mg/dL — ABNORMAL HIGH (ref 0–99)
Triglycerides: 145 mg/dL (ref 0–149)
VLDL Cholesterol Cal: 29 mg/dL (ref 5–40)

## 2015-06-12 LAB — BMP8+EGFR
BUN/Creatinine Ratio: 11 (ref 9–23)
BUN: 14 mg/dL (ref 6–24)
CALCIUM: 9.8 mg/dL (ref 8.7–10.2)
CO2: 25 mmol/L (ref 18–29)
CREATININE: 1.26 mg/dL — AB (ref 0.57–1.00)
Chloride: 108 mmol/L — ABNORMAL HIGH (ref 96–106)
GFR calc Af Amer: 54 mL/min/{1.73_m2} — ABNORMAL LOW (ref >59)
GFR, EST NON AFRICAN AMERICAN: 47 mL/min/{1.73_m2} — AB (ref >59)
GLUCOSE: 100 mg/dL — AB (ref 65–99)
Potassium: 4.2 mmol/L (ref 3.5–5.2)
Sodium: 148 mmol/L — ABNORMAL HIGH (ref 134–144)

## 2015-07-14 ENCOUNTER — Ambulatory Visit (INDEPENDENT_AMBULATORY_CARE_PROVIDER_SITE_OTHER): Payer: BLUE CROSS/BLUE SHIELD | Admitting: Family

## 2015-07-14 ENCOUNTER — Encounter: Payer: Self-pay | Admitting: Family

## 2015-07-14 VITALS — BP 132/76 | HR 81 | Temp 97.5°F | Ht 65.25 in | Wt 187.2 lb

## 2015-07-14 DIAGNOSIS — R7303 Prediabetes: Secondary | ICD-10-CM

## 2015-07-14 DIAGNOSIS — Z09 Encounter for follow-up examination after completed treatment for conditions other than malignant neoplasm: Secondary | ICD-10-CM | POA: Diagnosis not present

## 2015-07-14 DIAGNOSIS — R42 Dizziness and giddiness: Secondary | ICD-10-CM

## 2015-07-14 DIAGNOSIS — N3000 Acute cystitis without hematuria: Secondary | ICD-10-CM | POA: Diagnosis not present

## 2015-07-14 LAB — POCT UA - MICROSCOPIC ONLY
Bacteria, U Microscopic: NEGATIVE
CRYSTALS, UR, HPF, POC: NEGATIVE
Casts, Ur, LPF, POC: NEGATIVE
EPITHELIAL CELLS, URINE PER MICROSCOPY: NEGATIVE
Mucus, UA: NEGATIVE
YEAST UA: NEGATIVE

## 2015-07-14 LAB — POCT URINALYSIS DIPSTICK
Bilirubin, UA: NEGATIVE
GLUCOSE UA: NEGATIVE
Ketones, UA: NEGATIVE
Nitrite, UA: NEGATIVE
Protein, UA: NEGATIVE
SPEC GRAV UA: 1.01
UROBILINOGEN UA: NEGATIVE
pH, UA: 6.5

## 2015-07-14 MED ORDER — FLUTICASONE PROPIONATE 50 MCG/ACT NA SUSP: 2.0000 | Freq: Every day | NASAL | Status: DC

## 2015-07-14 NOTE — Progress Notes (Signed)
   Subjective:    Patient ID: Heather Leach, female    DOB: 1956/01/15, 60 y.o.   MRN: [id number]  HPI PT 6presents to the office today for hospital follow up for dizziness, UTI, and hyperglycemia on 07/10/15. CT scan was negative. PT was given macrobid 100mg  BID. Pt states she was going to the back every hour, but states she continues to have frequency but not as bad. Pt states she is having lower back pain of a 7 out 10. Pt states she has intermittent dizziness. Pt denies any triggers for the dizziness, but states she is having nausea.  Pt's Hgb was stable. Pt has one more day left of antibiotic. PT had a HgbA1C drawn on 06/11/15 of 6.1.  [facility] ED notes reviewed.   Review of Systems  Constitutional: Negative.   HENT: Negative.   Eyes: Negative.   Respiratory: Negative.  Negative for shortness of breath.   Cardiovascular: Negative.  Negative for palpitations.  Gastrointestinal: Negative.   Endocrine: Negative.   Genitourinary: Negative.   Musculoskeletal: Negative.   Neurological: Negative.  Negative for headaches.  Hematological: Negative.   Psychiatric/Behavioral: Negative.   All other systems reviewed and are negative.      Objective:   Physical Exam  Constitutional: She is oriented to person, place, and time. She appears well-developed and well-nourished. No distress.  HENT:  Head: Normocephalic and atraumatic.  Eyes: Pupils are equal, round, and reactive to light.  Neck: Normal range of motion. Neck supple. No thyromegaly present.  Cardiovascular: Normal rate, regular rhythm, normal heart sounds and intact distal pulses.   No murmur heard. Pulmonary/Chest: Effort normal and breath sounds normal. No respiratory distress. She has no wheezes.  Abdominal: Soft. Bowel sounds are normal. She exhibits no distension. There is no tenderness.  Musculoskeletal: Normal range of motion. She exhibits no edema or tenderness.  Slight left CVA tenderness   Neurological: She is alert and  oriented to person, place, and time. She has normal reflexes. No cranial nerve deficit.  Skin: Skin is warm and dry.  Psychiatric: She has a normal mood and affect. Her behavior is normal. Judgment and thought content normal.  Vitals reviewed.   BP 132/76 mmHg  Pulse 81  Temp(Src) 97.5 F (36.4 C) (Oral)  Ht 5' 5.25" (1.657 m)  Wt 187 lb 3.2 oz (84.913 kg)  BMI 30.93 kg/m2       Assessment & Plan:  1. Acute cystitis without hematuria -Has greatly improved since urine in ED- Pt to continue with macrobid -Urine culture pending -Force fluids - POCT urinalysis dipstick - POCT UA - Microscopic Only - Urine culture  2. Dizziness and giddiness -Falls precaution discussed -Continue antivert- PT to try half of 25 mg because it make her "sleepy" - fluticasone (FLONASE) 50 MCG/ACT nasal spray; Place 2 sprays into both nostrils daily.  Dispense: 16 g; Refill: 6  3. Hospital discharge follow-up  4. Pre-diabetes -Pt to make appt with Tammy for diet and DM educations -Discussed low carb diet and exercise -RTO in 3 months   Evelina Dun, FNP

## 2015-07-14 NOTE — Patient Instructions (Signed)

## 2015-07-15 ENCOUNTER — Ambulatory Visit: Payer: BLUE CROSS/BLUE SHIELD | Admitting: Pediatrics

## 2015-07-16 LAB — URINE CULTURE

## 2015-07-22 ENCOUNTER — Ambulatory Visit (INDEPENDENT_AMBULATORY_CARE_PROVIDER_SITE_OTHER): Payer: BLUE CROSS/BLUE SHIELD | Admitting: Pediatrics

## 2015-07-22 ENCOUNTER — Encounter: Payer: Self-pay | Admitting: Pediatrics

## 2015-07-22 ENCOUNTER — Encounter (INDEPENDENT_AMBULATORY_CARE_PROVIDER_SITE_OTHER): Payer: Self-pay

## 2015-07-22 VITALS — BP 122/77 | HR 70 | Temp 98.3°F | Ht 65.25 in | Wt 187.2 lb

## 2015-07-22 DIAGNOSIS — R42 Dizziness and giddiness: Secondary | ICD-10-CM | POA: Diagnosis not present

## 2015-07-22 MED ORDER — MECLIZINE HCL 12.5 MG PO TABS: 12.5000 mg | ORAL_TABLET | Freq: Three times a day (TID) | ORAL | Status: DC | PRN

## 2015-07-22 NOTE — Patient Instructions (Signed)
Epley Maneuver Self-Care  WHAT IS THE EPLEY MANEUVER?  The Epley maneuver is an exercise you can do to relieve symptoms of benign paroxysmal positional vertigo (BPPV). This condition is often just referred to as vertigo. BPPV is caused by the movement of tiny crystals (canaliths) inside your inner ear. The accumulation and movement of canaliths in your inner ear causes a sudden spinning sensation (vertigo) when you move your head to certain positions. Vertigo usually lasts about 30 seconds. BPPV usually occurs in just one ear. If you get vertigo when you lie on your left side, you probably have BPPV in your left ear. Your health care provider can tell you which ear is involved.   BPPV may be caused by a head injury. Many people older than 50 get BPPV for unknown reasons. If you have been diagnosed with BPPV, your health care provider may teach you how to do this maneuver. BPPV is not life threatening (benign) and usually goes away in time.   WHEN SHOULD I PERFORM THE EPLEY MANEUVER?  You can do this maneuver at home whenever you have symptoms of vertigo. You may do the Epley maneuver up to 3 times a day until your symptoms of vertigo go away.  HOW SHOULD I DO THE EPLEY MANEUVER?  1. Sit on the edge of a bed or table with your back straight. Your legs should be extended or hanging over the edge of the bed or table.    2. Turn your head halfway toward the affected ear.    3. Lie backward quickly with your head turned until you are lying flat on your back. You may want to position a pillow under your shoulders.    4. Hold this position for 30 seconds. You may experience an attack of vertigo. This is normal. Hold this position until the vertigo stops.  5. Then turn your head to the opposite direction until your unaffected ear is facing the floor.    6. Hold this position for 30 seconds. You may experience an attack of vertigo. This is normal. Hold this position until the vertigo stops.  7. Now turn your whole body to  the same side as your head. Hold for another 30 seconds.    8. You can then sit back up.  ARE THERE RISKS TO THIS MANEUVER?  In some cases, you may have other symptoms (such as changes in your vision, weakness, or numbness). If you have these symptoms, stop doing the maneuver and call your health care provider. Even if doing these maneuvers relieves your vertigo, you may still have dizziness. Dizziness is the sensation of light-headedness but without the sensation of movement. Even though the Epley maneuver may relieve your vertigo, it is possible that your symptoms will return within 5 years.  WHAT SHOULD I DO AFTER THIS MANEUVER?  After doing the Epley maneuver, you can return to your normal activities. Ask your doctor if there is anything you should do at home to prevent vertigo. This may include:  · Sleeping with two or more pillows to keep your head elevated.  · Not sleeping on the side of your affected ear.  · Getting up slowly from bed.  · Avoiding sudden movements during the day.  · Avoiding extreme head movement, like looking up or bending over.  · Wearing a cervical collar to prevent sudden head movements.  WHAT SHOULD I DO IF MY SYMPTOMS GET WORSE?  Call your health care provider if your vertigo gets worse. Call your provider right way if   you have other symptoms, including:   · Nausea.  · Vomiting.  · Headache.  · Weakness.  · Numbness.  · Vision changes.     This information is not intended to replace advice given to you by your health care provider. Make sure you discuss any questions you have with your health care provider.     Document Released: 06/17/2013 Document Reviewed: 06/17/2013  Elsevier Interactive Patient Education ©2016 [employer]

## 2015-07-22 NOTE — Progress Notes (Signed)
Subjective:    Patient ID: Heather Leach, female    DOB: 25-Nov-1955, 60 y.o.   MRN: [id number]  CC: Dizziness   HPI: Heather Leach is a 60 y.o. female presenting for Dizziness  Went to [facility] for dizziness/vertigo Blood sugar spiked, also had a UTI Finished antibiotics for UTI Vertigo is constant, present for past 4 weeks CT scan was normal in ED Feels off balance, not able to work at [employer] at work as she usually needs to [facility] pulled to the L side Has tried meclizine at home Nothing has made symptoms better or worse    Depression screen [facility] 2/9 07/22/2015 06/11/2015 11/12/2014  Decreased Interest 0 0 0  Down, Depressed, Hopeless 0 0 0  PHQ - 2 Score 0 0 0     Relevant past medical, surgical, family and social history reviewed and updated as indicated. Interim medical history since our last visit reviewed. Allergies and medications reviewed and updated.    ROS: Per HPI unless specifically indicated above  History  Smoking status  . Never Smoker   Smokeless tobacco  . Never Used    Past Medical History Patient Active Problem List   Diagnosis Date Noted  . BMI 31.0-31.9,adult 06/12/2015  . Elevated blood pressure 06/12/2015  . Tinea corporis 06/12/2015  . Pre-diabetes 06/12/2015  . 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 Urinary frequency 10/28/2014  . Urinary incontinence 10/28/2014  . Vaginal itching 10/28/2014  . Hyperlipidemia 05/18/2010  . OVERWEIGHT 05/18/2010  . PRECORDIAL PAIN 05/18/2010  . ABNORMAL STRESS ELECTROCARDIOGRAM 05/18/2010    Current Outpatient Prescriptions  Medication Sig Dispense Refill  . fluticasone (FLONASE) 50 MCG/ACT nasal spray Place 2 sprays into both nostrils daily. 16 g 6  . meclizine (ANTIVERT) 12.5 MG tablet Take 1 tablet (12.5 mg total) by mouth 3 (three) times daily as needed for dizziness. 60 tablet 0   No current facility-administered medications for this visit.       Objective:    BP 122/77 mmHg   Pulse 70  Temp(Src) 98.3 F (36.8 C) (Oral)  Ht 5' 5.25" (1.657 m)  Wt 187 lb 3.2 oz (84.913 kg)  BMI 30.93 kg/m2  Wt Readings from Last 3 Encounters:  07/22/15 187 lb 3.2 oz (84.913 kg)  07/14/15 187 lb 3.2 oz (84.913 kg)  06/11/15 190 lb 6.4 oz (86.365 kg)     Gen: NAD, alert, cooperative with exam, NCAT EYES: EOMI, no scleral injection or icterus ENT:  TMs pearly gray b/l, OP without erythema LYMPH: no cervical LAD CV: NRRR, normal S1/S2, no murmur, distal pulses 2+ b/l Resp: CTABL, no wheezes, normal WOB Abd: +BS, soft, NTND. no guarding or organomegaly Ext: No edema, warm Neuro: Alert and oriented, strength equal b/l UE and LE, coordination grossly normal, CN III-XII intact, no nystagmus, worsening symptoms with epley maneuver b/l MSK: normal muscle bulk     Assessment & Plan:    Heather Leach was seen today for vertigo that has been constant for the past 4 weeks. CT was negative. Will get MRI, labs as below. If MRI negative will reer for vestibular rehab/PT.  Diagnoses and all orders for this visit:  Vertigo -     meclizine (ANTIVERT) 12.5 MG tablet; Take 1 tablet (12.5 mg total) by mouth 3 (three) times daily as needed for dizziness. -     MR Brain W Wo Contrast; Future -     BMP8+EGFR -     CBC    Follow up plan: Return  in about 2 weeks (around 08/05/2015).  Assunta Found, MD [facility] 07/22/2015, 3:47 PM

## 2015-07-23 LAB — CBC
HEMATOCRIT: 36.8 % (ref 34.0–46.6)
HEMOGLOBIN: 12.8 g/dL (ref 11.1–15.9)
MCH: 27.3 pg (ref 26.6–33.0)
MCHC: 34.8 g/dL (ref 31.5–35.7)
MCV: 79 fL (ref 79–97)
Platelets: 375 10*3/uL (ref 150–379)
RBC: 4.69 x10E6/uL (ref 3.77–5.28)
RDW: 14.1 % (ref 12.3–15.4)
WBC: 11 10*3/uL — ABNORMAL HIGH (ref 3.4–10.8)

## 2015-07-23 LAB — BMP8+EGFR
BUN / CREAT RATIO: 11 (ref 9–23)
BUN: 14 mg/dL (ref 6–24)
CALCIUM: 9.2 mg/dL (ref 8.7–10.2)
CO2: 23 mmol/L (ref 18–29)
Chloride: 97 mmol/L (ref 96–106)
Creatinine, Ser: 1.29 mg/dL — ABNORMAL HIGH (ref 0.57–1.00)
GFR, EST AFRICAN AMERICAN: 53 mL/min/{1.73_m2} — AB (ref >59)
GFR, EST NON AFRICAN AMERICAN: 46 mL/min/{1.73_m2} — AB (ref >59)
Glucose: 91 mg/dL (ref 65–99)
Potassium: 3.8 mmol/L (ref 3.5–5.2)
Sodium: 139 mmol/L (ref 134–144)

## 2015-07-28 ENCOUNTER — Ambulatory Visit: Payer: BLUE CROSS/BLUE SHIELD | Admitting: Pharmacist

## 2015-08-03 DIAGNOSIS — Z0289 Encounter for other administrative examinations: Secondary | ICD-10-CM

## 2015-08-05 ENCOUNTER — Ambulatory Visit: Payer: BLUE CROSS/BLUE SHIELD | Admitting: Pediatrics

## 2015-08-05 ENCOUNTER — Encounter: Payer: Self-pay | Admitting: Family

## 2015-08-10 ENCOUNTER — Emergency Department (HOSPITAL_COMMUNITY): Payer: BLUE CROSS/BLUE SHIELD

## 2015-08-10 ENCOUNTER — Observation Stay (HOSPITAL_COMMUNITY)
Admission: EM | Admit: 2015-08-10 | Discharge: 2015-08-15 | Disposition: A | Discharge status: Home / Self Care | Payer: BLUE CROSS/BLUE SHIELD | Attending: Internal Medicine | Admitting: Internal Medicine

## 2015-08-10 ENCOUNTER — Encounter (HOSPITAL_COMMUNITY): Payer: Self-pay | Admitting: Family Medicine

## 2015-08-10 DIAGNOSIS — N390 Urinary tract infection, site not specified: Secondary | ICD-10-CM | POA: Insufficient documentation

## 2015-08-10 DIAGNOSIS — I951 Orthostatic hypotension: Secondary | ICD-10-CM | POA: Diagnosis not present

## 2015-08-10 DIAGNOSIS — E785 Hyperlipidemia, unspecified: Secondary | ICD-10-CM | POA: Insufficient documentation

## 2015-08-10 DIAGNOSIS — E876 Hypokalemia: Secondary | ICD-10-CM | POA: Diagnosis not present

## 2015-08-10 DIAGNOSIS — B9620 Unspecified Escherichia coli [E. coli] as the cause of diseases classified elsewhere: Secondary | ICD-10-CM | POA: Diagnosis not present

## 2015-08-10 DIAGNOSIS — R55 Syncope and collapse: Secondary | ICD-10-CM | POA: Diagnosis not present

## 2015-08-10 DIAGNOSIS — R2990 Unspecified symptoms and signs involving the nervous system: Secondary | ICD-10-CM

## 2015-08-10 DIAGNOSIS — N183 Chronic kidney disease, stage 3 (moderate): Secondary | ICD-10-CM | POA: Diagnosis not present

## 2015-08-10 DIAGNOSIS — R509 Fever, unspecified: Secondary | ICD-10-CM

## 2015-08-10 DIAGNOSIS — R42 Dizziness and giddiness: Secondary | ICD-10-CM

## 2015-08-10 LAB — COMPREHENSIVE METABOLIC PANEL
ALT: 12 U/L — ABNORMAL LOW (ref 14–54)
AST: 12 U/L — AB (ref 15–41)
Albumin: 3.4 g/dL — ABNORMAL LOW (ref 3.5–5.0)
Alkaline Phosphatase: 71 U/L (ref 38–126)
Anion gap: 13 (ref 5–15)
BUN: 14 mg/dL (ref 6–20)
CHLORIDE: 102 mmol/L (ref 101–111)
CO2: 24 mmol/L (ref 22–32)
Calcium: 9.1 mg/dL (ref 8.9–10.3)
Creatinine, Ser: 1.52 mg/dL — ABNORMAL HIGH (ref 0.44–1.00)
GFR calc Af Amer: 43 mL/min — ABNORMAL LOW (ref >60)
GFR, EST NON AFRICAN AMERICAN: 37 mL/min — AB (ref >60)
Glucose, Bld: 132 mg/dL — ABNORMAL HIGH (ref 65–99)
POTASSIUM: 3.2 mmol/L — AB (ref 3.5–5.1)
Sodium: 139 mmol/L (ref 135–145)
Total Bilirubin: 0.9 mg/dL (ref 0.3–1.2)
Total Protein: 8 g/dL (ref 6.5–8.1)

## 2015-08-10 LAB — I-STAT CHEM 8, ED
BUN: 16 mg/dL (ref 6–20)
Calcium, Ion: 1.1 mmol/L — ABNORMAL LOW (ref 1.12–1.23)
Chloride: 102 mmol/L (ref 101–111)
Creatinine, Ser: 1.4 mg/dL — ABNORMAL HIGH (ref 0.44–1.00)
Glucose, Bld: 131 mg/dL — ABNORMAL HIGH (ref 65–99)
HEMATOCRIT: 37 % (ref 36.0–46.0)
HEMOGLOBIN: 12.6 g/dL (ref 12.0–15.0)
Potassium: 3.4 mmol/L — ABNORMAL LOW (ref 3.5–5.1)
Sodium: 140 mmol/L (ref 135–145)
TCO2: 27 mmol/L (ref 0–100)

## 2015-08-10 LAB — DIFFERENTIAL
BASOS ABS: 0 10*3/uL (ref 0.0–0.1)
BASOS PCT: 0 %
EOS ABS: 0.1 10*3/uL (ref 0.0–0.7)
Eosinophils Relative: 1 %
Lymphocytes Relative: 30 %
Lymphs Abs: 2.8 10*3/uL (ref 0.7–4.0)
MONOS PCT: 7 %
Monocytes Absolute: 0.7 10*3/uL (ref 0.1–1.0)
NEUTROS PCT: 62 %
Neutro Abs: 5.6 10*3/uL (ref 1.7–7.7)

## 2015-08-10 LAB — CBC
HCT: 34.6 % — ABNORMAL LOW (ref 36.0–46.0)
Hemoglobin: 10.9 g/dL — ABNORMAL LOW (ref 12.0–15.0)
MCH: 25.8 pg — ABNORMAL LOW (ref 26.0–34.0)
MCHC: 31.5 g/dL (ref 30.0–36.0)
MCV: 82 fL (ref 78.0–100.0)
PLATELETS: 321 10*3/uL (ref 150–400)
RBC: 4.22 MIL/uL (ref 3.87–5.11)
RDW: 14 % (ref 11.5–15.5)
WBC: 9.2 10*3/uL (ref 4.0–10.5)

## 2015-08-10 LAB — I-STAT TROPONIN, ED: TROPONIN I, POC: 0 ng/mL (ref 0.00–0.08)

## 2015-08-10 LAB — PROTIME-INR
INR: 1.12 (ref 0.00–1.49)
PROTHROMBIN TIME: 14.6 s (ref 11.6–15.2)

## 2015-08-10 LAB — APTT: APTT: 37 s (ref 24–37)

## 2015-08-10 LAB — CBG MONITORING, ED: Glucose-Capillary: 125 mg/dL — ABNORMAL HIGH (ref 65–99)

## 2015-08-10 NOTE — ED Notes (Signed)
Pt reports she is having a headache and vertigo for the last two weeks but it has got worse since Sunday. Also, complains of left facial twitching earlier tonight. Lasted a few minutes. Pt reports she has been by PCP for the vertigo.

## 2015-08-10 NOTE — ED Notes (Signed)
Consulted with Dr. Rolan Lipa of patients condition. Orders placed.

## 2015-08-11 ENCOUNTER — Observation Stay (HOSPITAL_COMMUNITY)
Admit: 2015-08-11 | Discharge: 2015-08-11 | Disposition: A | Discharge status: Home / Self Care | Payer: BLUE CROSS/BLUE SHIELD | Attending: Emergency Medicine | Admitting: Emergency Medicine

## 2015-08-11 ENCOUNTER — Emergency Department (HOSPITAL_COMMUNITY): Payer: BLUE CROSS/BLUE SHIELD

## 2015-08-11 DIAGNOSIS — R55 Syncope and collapse: Secondary | ICD-10-CM | POA: Diagnosis not present

## 2015-08-11 DIAGNOSIS — I951 Orthostatic hypotension: Secondary | ICD-10-CM

## 2015-08-11 DIAGNOSIS — R42 Dizziness and giddiness: Secondary | ICD-10-CM

## 2015-08-11 DIAGNOSIS — B9620 Unspecified Escherichia coli [E. coli] as the cause of diseases classified elsewhere: Secondary | ICD-10-CM | POA: Diagnosis not present

## 2015-08-11 DIAGNOSIS — N390 Urinary tract infection, site not specified: Secondary | ICD-10-CM | POA: Diagnosis not present

## 2015-08-11 DIAGNOSIS — E876 Hypokalemia: Secondary | ICD-10-CM | POA: Diagnosis not present

## 2015-08-11 DIAGNOSIS — N183 Chronic kidney disease, stage 3 (moderate): Secondary | ICD-10-CM | POA: Diagnosis not present

## 2015-08-11 LAB — GLUCOSE, CAPILLARY: GLUCOSE-CAPILLARY: 89 mg/dL (ref 65–99)

## 2015-08-11 LAB — MAGNESIUM: Magnesium: 2 mg/dL (ref 1.7–2.4)

## 2015-08-11 LAB — URINALYSIS, ROUTINE W REFLEX MICROSCOPIC
BILIRUBIN URINE: NEGATIVE
Glucose, UA: NEGATIVE mg/dL
Ketones, ur: NEGATIVE mg/dL
Nitrite: NEGATIVE
PH: 6 (ref 5.0–8.0)
Protein, ur: 30 mg/dL — AB
SPECIFIC GRAVITY, URINE: 1.005 (ref 1.005–1.030)

## 2015-08-11 LAB — TROPONIN I

## 2015-08-11 LAB — URINE MICROSCOPIC-ADD ON

## 2015-08-11 LAB — RAPID URINE DRUG SCREEN, HOSP PERFORMED
Amphetamines: NOT DETECTED
BARBITURATES: NOT DETECTED
Benzodiazepines: NOT DETECTED
Cocaine: NOT DETECTED
Opiates: NOT DETECTED
TETRAHYDROCANNABINOL: NOT DETECTED

## 2015-08-11 LAB — CBG MONITORING, ED: Glucose-Capillary: 111 mg/dL — ABNORMAL HIGH (ref 65–99)

## 2015-08-11 LAB — CORTISOL: Cortisol, Plasma: 14.3 ug/dL

## 2015-08-11 LAB — TSH: TSH: 1.179 u[IU]/mL (ref 0.350–4.500)

## 2015-08-11 MED ORDER — GADOBENATE DIMEGLUMINE 529 MG/ML IV SOLN
20.0000 mL | Freq: Once | INTRAVENOUS | Status: AC | PRN
  Administered 2015-08-11: 17 mL via INTRAVENOUS

## 2015-08-11 MED ORDER — ACETAMINOPHEN 650 MG RE SUPP
650.0000 mg | Freq: Four times a day (QID) | RECTAL | Status: DC | PRN
  Filled 2015-08-11: qty 1

## 2015-08-11 MED ORDER — CETYLPYRIDINIUM CHLORIDE 0.05 % MT LIQD
7.0000 mL | Freq: Two times a day (BID) | OROMUCOSAL | Status: DC
  Administered 2015-08-11 – 2015-08-14 (×2): 7 mL via OROMUCOSAL

## 2015-08-11 MED ORDER — MECLIZINE HCL 12.5 MG PO TABS: 12.5000 mg | ORAL_TABLET | Freq: Three times a day (TID) | ORAL | Status: DC | PRN

## 2015-08-11 MED ORDER — POTASSIUM CHLORIDE IN NACL 40-0.9 MEQ/L-% IV SOLN
INTRAVENOUS | Status: DC
  Administered 2015-08-11 – 2015-08-15 (×9): 125 mL/h via INTRAVENOUS
  Filled 2015-08-11 (×15): qty 1000

## 2015-08-11 MED ORDER — MECLIZINE HCL 25 MG PO TABS
25.0000 mg | ORAL_TABLET | Freq: Three times a day (TID) | ORAL | Status: DC | PRN
  Filled 2015-08-11: qty 1

## 2015-08-11 MED ORDER — LEVALBUTEROL HCL 0.63 MG/3ML IN NEBU: 0.6300 mg | INHALATION_SOLUTION | Freq: Four times a day (QID) | RESPIRATORY_TRACT | Status: DC | PRN

## 2015-08-11 MED ORDER — ENOXAPARIN SODIUM 40 MG/0.4ML ~~LOC~~ SOLN
40.0000 mg | SUBCUTANEOUS | Status: DC
  Filled 2015-08-11 (×4): qty 0.4

## 2015-08-11 MED ORDER — DOCUSATE SODIUM 100 MG PO CAPS
100.0000 mg | ORAL_CAPSULE | Freq: Two times a day (BID) | ORAL | Status: DC
  Administered 2015-08-13 – 2015-08-14 (×2): 100 mg via ORAL
  Filled 2015-08-11 (×8): qty 1

## 2015-08-11 MED ORDER — ONDANSETRON HCL 4 MG/2ML IJ SOLN
4.0000 mg | Freq: Four times a day (QID) | INTRAMUSCULAR | Status: DC | PRN
  Administered 2015-08-12: 4 mg via INTRAVENOUS
  Filled 2015-08-11: qty 2

## 2015-08-11 MED ORDER — CHLORHEXIDINE GLUCONATE 0.12 % MT SOLN
15.0000 mL | Freq: Two times a day (BID) | OROMUCOSAL | Status: DC
  Administered 2015-08-11 – 2015-08-14 (×3): 15 mL via OROMUCOSAL
  Filled 2015-08-11 (×8): qty 15

## 2015-08-11 MED ORDER — DIAZEPAM 2 MG PO TABS: 2.0000 mg | ORAL_TABLET | Freq: Three times a day (TID) | ORAL | Status: DC | PRN

## 2015-08-11 MED ORDER — SODIUM CHLORIDE 0.9 % IV BOLUS (SEPSIS)
500.0000 mL | Freq: Once | INTRAVENOUS | Status: AC
  Administered 2015-08-11: 500 mL via INTRAVENOUS

## 2015-08-11 MED ORDER — DIAZEPAM 2 MG PO TABS: 2.0000 mg | ORAL_TABLET | Freq: Once | ORAL | Status: DC

## 2015-08-11 MED ORDER — FLUTICASONE PROPIONATE 50 MCG/ACT NA SUSP
2.0000 | Freq: Every day | NASAL | Status: DC
  Administered 2015-08-11 – 2015-08-14 (×3): 2 via NASAL
  Filled 2015-08-11: qty 16

## 2015-08-11 MED ORDER — ONDANSETRON HCL 4 MG PO TABS: 4.0000 mg | ORAL_TABLET | Freq: Four times a day (QID) | ORAL | Status: DC | PRN

## 2015-08-11 MED ORDER — SODIUM CHLORIDE 0.9% FLUSH
3.0000 mL | Freq: Two times a day (BID) | INTRAVENOUS | Status: DC
  Administered 2015-08-11 – 2015-08-13 (×2): 3 mL via INTRAVENOUS

## 2015-08-11 MED ORDER — ACETAMINOPHEN 325 MG PO TABS
650.0000 mg | ORAL_TABLET | Freq: Four times a day (QID) | ORAL | Status: DC | PRN
  Administered 2015-08-12 (×2): 650 mg via ORAL
  Filled 2015-08-11 (×2): qty 2

## 2015-08-11 NOTE — ED Provider Notes (Signed)
CSN: [id number]     Arrival date & time 08/10/15  1956 History   First MD Initiated Contact with Patient 08/11/15 [phone]     Chief Complaint  Patient presents with  . Headache  . Dizziness     (Consider location/radiation/quality/duration/timing/severity/associated sxs/prior Treatment) HPI   Heather Leach is a 60 y.o F with a pmhx of HLD, who presents to the ED today c/o dizziness and facial twitching. Patient states that over the last month she has been experiencing severe dizziness causing her to be off balance. She was seen by her PCP for this who thought it was vertigo and placed her on meclizine with no relief. Patient states that her dizziness has gotten progressively worse. 3 days ago patient was at her home and she stood up to go to the restroom when she gets severely dizzy and passed out and woke up in her urine. Fall was unwitnessed. Denies any trauma or injury. Last night around 8 PM patient states that the left side of her face began to twitch and droop. This episode lasted only a few minutes and then resolved. Patient has associated significant dizziness, blurred vision and nausea. No focal weakness. Pt also states that when she experiences her dizziness episodes her blood sugar spikes really high. No known history of DM. Denies, fever, chills. No hx of seizures.   Past Medical History  Diagnosis Date  . Hyperlipidemia   . 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 Urinary frequency 10/28/2014  . Urinary incontinence 10/28/2014  . Herpes simplex virus (HSV) infection   . Vaginal itching 10/28/2014   Past Surgical History  Procedure Laterality Date  . Tubal ligation    . Breast biopsy     Family History  Problem Relation Age of Onset  . Heart failure Mother   . Diabetes Mother   . Aneurysm Father   . Diabetes Maternal Grandmother   . Diabetes Other     mom's siblings, maternal cousins   Social History  Substance Use Topics  . Smoking status: Never Smoker   . Smokeless tobacco:  Never Used  . Alcohol Use: No   OB History    Gravida Para Term Preterm AB TAB SAB Ectopic Multiple Living   2 1   1 1    1      Review of Systems  All other systems reviewed and are negative.     Allergies  Review of patient's allergies indicates no known allergies.  Home Medications   Prior to Admission medications   Medication Sig Start Date End Date Taking? Authorizing Provider  meclizine (ANTIVERT) 25 MG tablet Take 25 mg by mouth 3 (three) times daily as needed for dizziness.   Yes Historical Provider, MD  oseltamivir (TAMIFLU) 75 MG capsule Take 75 mg by mouth 2 (two) times daily.   Yes Historical Provider, MD  fluticasone (FLONASE) 50 MCG/ACT nasal spray Place 2 sprays into both nostrils daily. Patient not taking: Reported on 08/10/2015 07/14/15   Sharion Balloon, FNP  meclizine (ANTIVERT) 12.5 MG tablet Take 1 tablet (12.5 mg total) by mouth 3 (three) times daily as needed for dizziness. Patient not taking: Reported on 08/10/2015 07/22/15   Eustaquio Maize, MD   BP 131/80 mmHg  Pulse 76  Temp(Src) 99 F (37.2 C) (Oral)  Resp 16  Ht 5\' 5"  (1.651 m)  Wt 82.555 kg  BMI 30.29 kg/m2  SpO2 99% Physical Exam  Constitutional: She is oriented to person, place, and time. She appears well-developed  and well-nourished. No distress.  HENT:  Head: Normocephalic and atraumatic.  Mouth/Throat: No oropharyngeal exudate.  Eyes: Conjunctivae and EOM are normal. Pupils are equal, round, and reactive to light. Right eye exhibits no discharge. Left eye exhibits no discharge. No scleral icterus.  Neck: Normal range of motion. Neck supple.  No carotid bruits heard  Cardiovascular: Normal rate, regular rhythm, normal heart sounds and intact distal pulses.  Exam reveals no gallop and no friction rub.   No murmur heard. Pulmonary/Chest: Effort normal and breath sounds normal. No respiratory distress. She has no wheezes. She has no rales. She exhibits no tenderness.  Abdominal: Soft. She  exhibits no distension. There is no tenderness. There is no guarding.  Musculoskeletal: Normal range of motion. She exhibits no edema.  Lymphadenopathy:    She has no cervical adenopathy.  Neurological: She is alert and oriented to person, place, and time. She has normal strength and normal reflexes. She displays no tremor. No cranial nerve deficit or sensory deficit. She exhibits normal muscle tone. She displays a negative Romberg sign. Coordination and gait normal. GCS eye subscore is 4. GCS verbal subscore is 5. GCS motor subscore is 6.  Skin: Skin is warm and dry. No rash noted. She is not diaphoretic. No erythema. No pallor.  Psychiatric: She has a normal mood and affect. Her behavior is normal.  Nursing note and vitals reviewed.   ED Course  Procedures (including critical care time) Labs Review Labs Reviewed  CBC - Abnormal; Notable for the following:    Hemoglobin 10.9 (*)    HCT 34.6 (*)    MCH 25.8 (*)    All other components within normal limits  COMPREHENSIVE METABOLIC PANEL - Abnormal; Notable for the following:    Potassium 3.2 (*)    Glucose, Bld 132 (*)    Creatinine, Ser 1.52 (*)    Albumin 3.4 (*)    AST 12 (*)    ALT 12 (*)    GFR calc non Af Amer 37 (*)    GFR calc Af Amer 43 (*)    All other components within normal limits  CBG MONITORING, ED - Abnormal; Notable for the following:    Glucose-Capillary 125 (*)    All other components within normal limits  I-STAT CHEM 8, ED - Abnormal; Notable for the following:    Potassium 3.4 (*)    Creatinine, Ser 1.40 (*)    Glucose, Bld 131 (*)    Calcium, Ion 1.10 (*)    All other components within normal limits  PROTIME-INR  APTT  DIFFERENTIAL  I-STAT TROPOININ, ED    Imaging Review Ct Head Wo Contrast  08/10/2015  CLINICAL DATA:  Headache and vertigo for 2 weeks, worse since Sunday. Facial twitching. Patient has been seen by primary care physician for vertigo. EXAM: CT HEAD WITHOUT CONTRAST TECHNIQUE:  Contiguous axial images were obtained from the base of the skull through the vertex without intravenous contrast. COMPARISON:  07/10/2015 FINDINGS: Ventricles and sulci appear symmetrical. No ventricular dilatation. No mass effect or midline shift. No abnormal extra-axial fluid collections. Gray-white matter junctions are distinct. Basal cisterns are not effaced. No evidence of acute intracranial hemorrhage. Calcifications along the falx and tentorium, likely dystrophic. No depressed skull fractures. Visualized paranasal sinuses and mastoid air cells are not opacified. IMPRESSION: No acute intracranial abnormalities. Electronically Signed   By: Lucienne Capers M.D.   On: 08/10/2015 22:07   I have personally reviewed and evaluated these images and lab results as  part of my medical decision-making.   EKG Interpretation   Date/Time:  Tuesday August 10 2015 20:59:11 EST Ventricular Rate:  81 PR Interval:  134 QRS Duration: 109 QT Interval:  377 QTC Calculation: 438 R Axis:   -27 Text Interpretation:  Sinus rhythm Borderline left axis deviation ED  PHYSICIAN INTERPRETATION AVAILABLE IN [facility] Confirmed by TEST,  Record ([id number]) on 08/11/2015 7:03:34 AM      MDM   Final diagnoses:  Stroke-like symptoms    60 y.o F presents to the ED c/o dizziness, syncopal episode and facial twitching. See HPI. Concern for stroke vs seizure like symptoms given syncopal episode 4 days ago with incontinence. No neurological deficits on exam. Left sided facial twitching/droop occurred >12 hours ago and lasted only a few minutes. Now resolved. CT head negative. Will order MRI brain, MRA head and neck to evaluate vertebral and carotid arteries.   Spoke with Dr. Silverio Decamp with neurology who recommends that pt have EEG. He states that another neurologist will be on call at [facility] [place] to consult that physician at that time.   Spoke with Dr. Nicole Kindred with neuro, he will consult pt in ED. MRI  reveals no acute intracranial abnormality. No hemorrhage or mass effect seen. Moderate vascular tortuosity seen at left seventh nerve root injuries sound. Dr. Nicole Kindred recommends admission for observation. EEG can be performed as an inpatient. We'll order a PT for vestibular training and start on low-dose diazepam he suspects this is intractable vertigo.  Spoke with hospitalist who will admit patient for observation.    Dondra Spry [place], PA-C 08/11/15 1106  Everlene Balls, MD 08/11/15 [phone]

## 2015-08-11 NOTE — H&P (Signed)
[facility] History and Physical  Heather Leach [id number] DOB: 04/22/1956 DOA: 08/10/2015  Referring physician:  PCP: Eustaquio Maize, MD   Chief Complaint: Dizziness   HPI:  60 year old female with a history of dyslipidemia, presents with a chief complaint of vertigo, symptoms started one month ago. Patient saw her primary care provider was prescribed meclizine, also diagnosed with a urinary tract infection, patient also had URI-like symptoms at the beginning of February, symptoms associated with some nausea. She denied any fever cough chest pain or shortness of breath. Patient has also noticed increased urinary frequency. Because of the URI the patient has been not eating and drinking as much. Patient had one episode of loss of consciousness 3 days ago during which she was thought to be incontinent of urine. Last night she had some twitching and tingling of the left side of her face. Workup in the ER included MRI of the brain which was negative. Patient's orthostatics were also negative but blood pressure was in the 0000000 systolic. MRA of the head and neck was also unremarkable. Patient is neurologically intact without any gross focal deficits. Patient seen by neurology in the ER, and observation recommended.      Review of Systems: negative for the following  Constitutional: Denies fever, chills, diaphoresis, appetite change and fatigue.  HEENT: Denies photophobia, eye pain, redness, hearing loss, ear pain, congestion, sore throat, rhinorrhea, sneezing, mouth sores, trouble swallowing, neck pain, neck stiffness and tinnitus.  Respiratory: Denies SOB, DOE, cough, chest tightness, and wheezing.  Cardiovascular: Denies chest pain, palpitations and leg swelling.  Gastrointestinal: Denies nausea, vomiting, abdominal pain, diarrhea, constipation, blood in stool and abdominal distention.  Genitourinary: Denies dysuria, urgency, frequency, hematuria, flank pain and difficulty urinating.   Musculoskeletal: Denies myalgias, back pain, joint swelling, arthralgias and gait problem.  Skin: Denies pallor, rash and wound.  Neurological: Positive for dizziness and vertigo , seizures, syncope, weakness, light-headedness, numbness and headaches.  Hematological: Denies adenopathy. Easy bruising, personal or family bleeding history  Psychiatric/Behavioral: Denies suicidal ideation, mood changes, confusion, nervousness, sleep disturbance and agitation       Past Medical History  Diagnosis Date  . Hyperlipidemia   . 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 Urinary frequency 10/28/2014  . Urinary incontinence 10/28/2014  . Herpes simplex virus (HSV) infection   . Vaginal itching 10/28/2014     Past Surgical History  Procedure Laterality Date  . Tubal ligation    . Breast biopsy        Social History:  reports that she has never smoked. She has never used smokeless tobacco. She reports that she does not drink alcohol or use illicit drugs.    No Known Allergies  Family History  Problem Relation Age of Onset  . Heart failure Mother   . Diabetes Mother   . Aneurysm Father   . Diabetes Maternal Grandmother   . Diabetes Other     mom's siblings, maternal cousins        Prior to Admission medications   Medication Sig Start Date End Date Taking? Authorizing Provider  meclizine (ANTIVERT) 25 MG tablet Take 25 mg by mouth 3 (three) times daily as needed for dizziness.   Yes Historical Provider, MD  oseltamivir (TAMIFLU) 75 MG capsule Take 75 mg by mouth 2 (two) times daily.   Yes Historical Provider, MD  fluticasone (FLONASE) 50 MCG/ACT nasal spray Place 2 sprays into both nostrils daily. Patient not taking: Reported on 08/10/2015 07/14/15   Sharion Balloon, FNP  meclizine (ANTIVERT) 12.5 MG tablet Take 1 tablet (12.5 mg total) by mouth 3 (three) times daily as needed for dizziness. Patient not taking: Reported on 08/10/2015 07/22/15   Eustaquio Maize, MD     Physical  Exam: Filed Vitals:   08/11/15 0634 08/11/15 1000 08/11/15 1004 08/11/15 1117  BP: 131/80 105/63 131/74 128/72  Pulse: 76 49 76 70  Temp:   98.7 F (37.1 C)   TempSrc:   Oral   Resp: 16  16 15   Height:      Weight:      SpO2: 99% 97% 100% 99%     Constitutional: Vital signs reviewed. Patient is a well-developed and well-nourished in no acute distress and cooperative with exam. Alert and oriented x3.  Head: Normocephalic and atraumatic  Ear: TM normal bilaterally  Mouth: no erythema or exudates, MMM  Eyes: PERRL, EOMI, conjunctivae normal, No scleral icterus.  Neck: Supple, Trachea midline normal ROM, No JVD, mass, thyromegaly, or carotid bruit present.  Cardiovascular: RRR, S1 normal, S2 normal, no MRG, pulses symmetric and intact bilaterally  Pulmonary/Chest: CTAB, no wheezes, rales, or rhonchi  Abdominal: Soft. Non-tender, non-distended, bowel sounds are normal, no masses, organomegaly, or guarding present.  GU: no CVA tenderness Musculoskeletal: No joint deformities, erythema, or stiffness, ROM full and no nontender Ext: no edema and no cyanosis, pulses palpable bilaterally (DP and PT)  Hematology: no cervical, inginal, or axillary adenopathy.  Neurological: A&O x3, Strenght is normal and symmetric bilaterally, cranial nerve II-XII are grossly intact, no focal motor deficit, sensory intact to light touch bilaterally.  Skin: Warm, dry and intact. No rash, cyanosis, or clubbing.  Psychiatric: Normal mood and affect. speech and behavior is normal. Judgment and thought content normal. Cognition and memory are normal.      Data Review   Micro Results No results found for this or any previous visit (from the past 240 hour(s)).  Radiology Reports Ct Head Wo Contrast  08/10/2015  CLINICAL DATA:  Headache and vertigo for 2 weeks, worse since Sunday. Facial twitching. Patient has been seen by primary care physician for vertigo. EXAM: CT HEAD WITHOUT CONTRAST TECHNIQUE: Contiguous  axial images were obtained from the base of the skull through the vertex without intravenous contrast. COMPARISON:  07/10/2015 FINDINGS: Ventricles and sulci appear symmetrical. No ventricular dilatation. No mass effect or midline shift. No abnormal extra-axial fluid collections. Gray-white matter junctions are distinct. Basal cisterns are not effaced. No evidence of acute intracranial hemorrhage. Calcifications along the falx and tentorium, likely dystrophic. No depressed skull fractures. Visualized paranasal sinuses and mastoid air cells are not opacified. IMPRESSION: No acute intracranial abnormalities. Electronically Signed   By: Lucienne Capers M.D.   On: 08/10/2015 22:07   Mr Virgel Paling Wo Contrast  08/11/2015  CLINICAL DATA:  60 year old female with 2 weeks of headache and vertigo, exacerbation for 3 days with new left facial twitching. Initial encounter. EXAM: MRI HEAD WITHOUT AND WITH CONTRAST MRA HEAD WITHOUT CONTRAST MRA NECK WITHOUT AND WITH CONTRAST TECHNIQUE: Multiplanar, multiecho pulse sequences of the brain and surrounding structures were obtained without and with intravenous contrast. Angiographic images of the Circle of Willis were obtained using MRA technique without intravenous contrast. Angiographic images of the neck were obtained using MRA technique without and with intravenous contrast. Carotid stenosis measurements (when applicable) are obtained utilizing NASCET criteria, using the distal internal carotid diameter as the denominator. CONTRAST:  15mL MULTIHANCE GADOBENATE DIMEGLUMINE 529 MG/ML IV SOLN COMPARISON:  Head CT without contrast 08/10/2015  and earlier. FINDINGS: MRI HEAD FINDINGS Mild scaphocephaly. Cerebral volume is within normal limits. No restricted diffusion to suggest acute infarction. No midline shift, mass effect, evidence of mass lesion, ventriculomegaly, extra-axial collection or acute intracranial hemorrhage. Cervicomedullary junction and pituitary are within normal  limits. Negative visualized cervical spine. Major intracranial vascular flow voids are within normal limits. Minimal to mild for age mostly superior frontal gyrus subcortical white matter T2 and FLAIR hyperintensity. Negative gray and white matter signal elsewhere. No cortical encephalomalacia or chronic cerebral blood products. No abnormal enhancement identified. Visible internal auditory structures appear normal. However, there might be in some mass effect related to distal vertebral artery tortuosity at the left cerebellopontine angle near the seventh nerve root entry zone, uncertain (series 4, image 13). Mastoids are clear. Paranasal sinuses are clear. Negative orbit and scalp soft tissues. Visualized bone marrow signal is within normal limits. MRA NECK FINDINGS Precontrast time-of-flight imaging reveals antegrade flow in both carotid and vertebral arteries throughout the neck. Post-contrast MRA images reveal a 3 vessel arch configuration with normal great vessel origins. Normal right CCA and right carotid bifurcation. Moderately tortuous but otherwise normal cervical right ICA. Mildly to moderately tortuous left CCA. Negative left carotid bifurcation. Mildly tortuous cervical left ICA. No proximal subclavian artery stenosis. Both vertebral artery origins are normal. Mildly tortuous V1 segments. Codominant vertebral arteries. V2, V3, and distal vertebral arteries appear within normal limits. MRA HEAD FINDINGS Antegrade flow in the posterior circulation with codominant distal vertebral arteries which appear normal. Normal PICA origins. Tortuous but otherwise normal vertebrobasilar junction and basilar artery. Posterior communicating arteries are diminutive or absent. Normal SCA and PCA origins. Bilateral PCA branches are within normal limits. Antegrade flow in both ICA siphons. No siphon stenosis. Normal left ophthalmic artery origin. Normal carotid termini. Normal MCA and ACA origins. The anterior communicating  artery might be fenestrated (series 6, image 102). Visualized bilateral ACA branches are within normal limits. MCA M1 segments, bifurcations, and visualized bilateral MCA branches are within normal limits. IMPRESSION: 1. No acute intracranial abnormality, see # 3. Minimal for age nonspecific frontal lobe white matter signal changes, most commonly due to chronic small vessel disease. 2. Negative MRA Head and Neck aside from vessel tortuosity. 3. There might be some vascular tortuosity related in mass effect at the left seventh nerve root entry zone. If the facial twitching is always unilaterally on the left then consider the possibility of a vascular compression related left hemifacial spasm. Electronically Signed   By: Genevie Ann M.D.   On: 08/11/2015 10:06   Mr Angiogram Neck W Wo Contrast  08/11/2015  CLINICAL DATA:  60 year old female with 2 weeks of headache and vertigo, exacerbation for 3 days with new left facial twitching. Initial encounter. EXAM: MRI HEAD WITHOUT AND WITH CONTRAST MRA HEAD WITHOUT CONTRAST MRA NECK WITHOUT AND WITH CONTRAST TECHNIQUE: Multiplanar, multiecho pulse sequences of the brain and surrounding structures were obtained without and with intravenous contrast. Angiographic images of the Circle of Willis were obtained using MRA technique without intravenous contrast. Angiographic images of the neck were obtained using MRA technique without and with intravenous contrast. Carotid stenosis measurements (when applicable) are obtained utilizing NASCET criteria, using the distal internal carotid diameter as the denominator. CONTRAST:  24mL MULTIHANCE GADOBENATE DIMEGLUMINE 529 MG/ML IV SOLN COMPARISON:  Head CT without contrast 08/10/2015 and earlier. FINDINGS: MRI HEAD FINDINGS Mild scaphocephaly. Cerebral volume is within normal limits. No restricted diffusion to suggest acute infarction. No midline shift, mass effect, evidence of  mass lesion, ventriculomegaly, extra-axial collection or acute  intracranial hemorrhage. Cervicomedullary junction and pituitary are within normal limits. Negative visualized cervical spine. Major intracranial vascular flow voids are within normal limits. Minimal to mild for age mostly superior frontal gyrus subcortical white matter T2 and FLAIR hyperintensity. Negative gray and white matter signal elsewhere. No cortical encephalomalacia or chronic cerebral blood products. No abnormal enhancement identified. Visible internal auditory structures appear normal. However, there might be in some mass effect related to distal vertebral artery tortuosity at the left cerebellopontine angle near the seventh nerve root entry zone, uncertain (series 4, image 13). Mastoids are clear. Paranasal sinuses are clear. Negative orbit and scalp soft tissues. Visualized bone marrow signal is within normal limits. MRA NECK FINDINGS Precontrast time-of-flight imaging reveals antegrade flow in both carotid and vertebral arteries throughout the neck. Post-contrast MRA images reveal a 3 vessel arch configuration with normal great vessel origins. Normal right CCA and right carotid bifurcation. Moderately tortuous but otherwise normal cervical right ICA. Mildly to moderately tortuous left CCA. Negative left carotid bifurcation. Mildly tortuous cervical left ICA. No proximal subclavian artery stenosis. Both vertebral artery origins are normal. Mildly tortuous V1 segments. Codominant vertebral arteries. V2, V3, and distal vertebral arteries appear within normal limits. MRA HEAD FINDINGS Antegrade flow in the posterior circulation with codominant distal vertebral arteries which appear normal. Normal PICA origins. Tortuous but otherwise normal vertebrobasilar junction and basilar artery. Posterior communicating arteries are diminutive or absent. Normal SCA and PCA origins. Bilateral PCA branches are within normal limits. Antegrade flow in both ICA siphons. No siphon stenosis. Normal left ophthalmic artery  origin. Normal carotid termini. Normal MCA and ACA origins. The anterior communicating artery might be fenestrated (series 6, image 102). Visualized bilateral ACA branches are within normal limits. MCA M1 segments, bifurcations, and visualized bilateral MCA branches are within normal limits. IMPRESSION: 1. No acute intracranial abnormality, see # 3. Minimal for age nonspecific frontal lobe white matter signal changes, most commonly due to chronic small vessel disease. 2. Negative MRA Head and Neck aside from vessel tortuosity. 3. There might be some vascular tortuosity related in mass effect at the left seventh nerve root entry zone. If the facial twitching is always unilaterally on the left then consider the possibility of a vascular compression related left hemifacial spasm. Electronically Signed   By: Genevie Ann M.D.   On: 08/11/2015 10:06   Mr [name] [id number] Contrast  08/11/2015  CLINICAL DATA:  60 year old female with 2 weeks of headache and vertigo, exacerbation for 3 days with new left facial twitching. Initial encounter. EXAM: MRI HEAD WITHOUT AND WITH CONTRAST MRA HEAD WITHOUT CONTRAST MRA NECK WITHOUT AND WITH CONTRAST TECHNIQUE: Multiplanar, multiecho pulse sequences of the brain and surrounding structures were obtained without and with intravenous contrast. Angiographic images of the Circle of Willis were obtained using MRA technique without intravenous contrast. Angiographic images of the neck were obtained using MRA technique without and with intravenous contrast. Carotid stenosis measurements (when applicable) are obtained utilizing NASCET criteria, using the distal internal carotid diameter as the denominator. CONTRAST:  2mL MULTIHANCE GADOBENATE DIMEGLUMINE 529 MG/ML IV SOLN COMPARISON:  Head CT without contrast 08/10/2015 and earlier. FINDINGS: MRI HEAD FINDINGS Mild scaphocephaly. Cerebral volume is within normal limits. No restricted diffusion to suggest acute infarction. No midline shift, mass  effect, evidence of mass lesion, ventriculomegaly, extra-axial collection or acute intracranial hemorrhage. Cervicomedullary junction and pituitary are within normal limits. Negative visualized cervical spine. Major intracranial vascular flow voids are within  normal limits. Minimal to mild for age mostly superior frontal gyrus subcortical white matter T2 and FLAIR hyperintensity. Negative gray and white matter signal elsewhere. No cortical encephalomalacia or chronic cerebral blood products. No abnormal enhancement identified. Visible internal auditory structures appear normal. However, there might be in some mass effect related to distal vertebral artery tortuosity at the left cerebellopontine angle near the seventh nerve root entry zone, uncertain (series 4, image 13). Mastoids are clear. Paranasal sinuses are clear. Negative orbit and scalp soft tissues. Visualized bone marrow signal is within normal limits. MRA NECK FINDINGS Precontrast time-of-flight imaging reveals antegrade flow in both carotid and vertebral arteries throughout the neck. Post-contrast MRA images reveal a 3 vessel arch configuration with normal great vessel origins. Normal right CCA and right carotid bifurcation. Moderately tortuous but otherwise normal cervical right ICA. Mildly to moderately tortuous left CCA. Negative left carotid bifurcation. Mildly tortuous cervical left ICA. No proximal subclavian artery stenosis. Both vertebral artery origins are normal. Mildly tortuous V1 segments. Codominant vertebral arteries. V2, V3, and distal vertebral arteries appear within normal limits. MRA HEAD FINDINGS Antegrade flow in the posterior circulation with codominant distal vertebral arteries which appear normal. Normal PICA origins. Tortuous but otherwise normal vertebrobasilar junction and basilar artery. Posterior communicating arteries are diminutive or absent. Normal SCA and PCA origins. Bilateral PCA branches are within normal limits.  Antegrade flow in both ICA siphons. No siphon stenosis. Normal left ophthalmic artery origin. Normal carotid termini. Normal MCA and ACA origins. The anterior communicating artery might be fenestrated (series 6, image 102). Visualized bilateral ACA branches are within normal limits. MCA M1 segments, bifurcations, and visualized bilateral MCA branches are within normal limits. IMPRESSION: 1. No acute intracranial abnormality, see # 3. Minimal for age nonspecific frontal lobe white matter signal changes, most commonly due to chronic small vessel disease. 2. Negative MRA Head and Neck aside from vessel tortuosity. 3. There might be some vascular tortuosity related in mass effect at the left seventh nerve root entry zone. If the facial twitching is always unilaterally on the left then consider the possibility of a vascular compression related left hemifacial spasm. Electronically Signed   By: Genevie Ann M.D.   On: 08/11/2015 10:06     CBC  Recent Labs Lab 08/10/15 2108 08/10/15 2119  WBC 9.2  --   HGB 10.9* 12.6  HCT 34.6* 37.0  PLT 321  --   MCV 82.0  --   MCH 25.8*  --   MCHC 31.5  --   RDW 14.0  --   LYMPHSABS 2.8  --   MONOABS 0.7  --   EOSABS 0.1  --   BASOSABS 0.0  --     Chemistries   Recent Labs Lab 08/10/15 2108 08/10/15 2119  NA 139 140  K 3.2* 3.4*  CL 102 102  CO2 24  --   GLUCOSE 132* 131*  BUN 14 16  CREATININE 1.52* 1.40*  CALCIUM 9.1  --   AST 12*  --   ALT 12*  --   ALKPHOS 71  --   BILITOT 0.9  --    ------------------------------------------------------------------------------------------------------------------ estimated creatinine clearance is 46.5 mL/min (by C-G formula based on Cr of 1.4). ------------------------------------------------------------------------------------------------------------------ No results for input(s): HGBA1C in the last 72  hours. ------------------------------------------------------------------------------------------------------------------ No results for input(s): CHOL, HDL, LDLCALC, TRIG, CHOLHDL, LDLDIRECT in the last 72 hours. ------------------------------------------------------------------------------------------------------------------ No results for input(s): TSH, T4TOTAL, T3FREE, THYROIDAB in the last 72 hours.  Invalid input(s): FREET3 ------------------------------------------------------------------------------------------------------------------ No results  for input(s): VITAMINB12, FOLATE, FERRITIN, TIBC, IRON, RETICCTPCT in the last 72 hours.  Coagulation profile  Recent Labs Lab 08/10/15 2108  INR 1.12    No results for input(s): DDIMER in the last 72 hours.  Cardiac Enzymes No results for input(s): CKMB, TROPONINI, MYOGLOBIN in the last 168 hours.  Invalid input(s): CK ------------------------------------------------------------------------------------------------------------------ Invalid input(s): POCBNP   CBG:  Recent Labs Lab 08/10/15 2048  GLUCAP 125*       EKG: Date/Time: Tuesday August 10 2015 20:59:11 EST Ventricular Rate: 81 PR Interval: 134 QRS Duration: 109 QT Interval: 377 QTC Calculation: 438 R Axis: -27 Text Interpretation: Sinus rhythm Borderline left axis deviation ED    Assessment/Plan Active Problems:   Syncope and collapse TIA vs seizure vs orthostatic syncope in the setting of UTI/ URI MRI/MRA within normal limits, status post neurology consultation in the ER Neurology recommends observation, continue meclizine Continue Valium for vertigo EEG PT evaluation for vestibular training  Recent UTI-repeat UA pending     Hypokalemia-replete, check magnesium level    Orthostatic hypotension-likely dehydration from undiagnosed untreated diabetes, continue IV fluids   Hyperglycemia-according to the patient at one point his CBG was  238, check hemoglobin A1c      Code Status Orders        Start     Ordered   08/11/15 1045  Full code   Continuous     08/11/15 1045    Code Status History    Date Active Date Inactive Code Status Order ID Comments User Context   This patient has a current code status but no historical code status.      Family Communication: bedside Disposition Plan: Observation   Total time spent 55 minutes.Greater than 50% of this time was spent in counseling, explanation of diagnosis, planning of further management, and coordination of care  [facility] Pager [phone]  If 7PM-7AM, please contact night-coverage [email or web address] Password [email or web address] 08/11/2015, 11:25 AM

## 2015-08-11 NOTE — ED Notes (Signed)
Pt. Is in MRI and unable to obtain orthostatic vital signs at this time.

## 2015-08-11 NOTE — Consult Note (Signed)
Admission H&P    Chief Complaint: Vertigo and syncope.  HPI: Heather Leach is an 60 y.o. female with a history of hyperlipidemia presenting with vertigo which started one month ago, but has been worse over the past 1 week. She describes a spinning sensation that occurs with sitting or standing. She had one episode of loss of consciousness 3 days ago, during which she was incontinent of urine. She had an episode last night of brief twitching a.m. tingling involving the left side of her face which resolved after a few seconds. Patient had vertigo at that time as well. MRI of the brain today showed no acute intracranial abnormality. Changes noted in frontal lobe white matter were noted and are likely chronic small vessel disease. MRA of head and neck was unremarkable except for tortuosity of vessels noted. Patient indicated she has a feeling of fullness involving the years. She has not had symptoms of sinusitis. She also has not experienced any speech changes or focal weakness involving face or extremities.  Past Medical History  Diagnosis Date  . Hyperlipidemia   . 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 Urinary frequency 10/28/2014  . Urinary incontinence 10/28/2014  . Herpes simplex virus (HSV) infection   . Vaginal itching 10/28/2014    Past Surgical History  Procedure Laterality Date  . Tubal ligation    . Breast biopsy      Family History  Problem Relation Age of Onset  . Heart failure Mother   . Diabetes Mother   . Aneurysm Father   . Diabetes Maternal Grandmother   . Diabetes Other     mom's siblings, maternal cousins   Social History:  reports that she has never smoked. She has never used smokeless tobacco. She reports that she does not drink alcohol or use illicit drugs.  Allergies: No Known Allergies  Medications: Patient's preadmission medications are reviewed by me.  ROS: History obtained from the patient  General ROS: negative for - chills, fatigue, fever, night sweats,  weight gain or weight loss Psychological ROS: negative for - behavioral disorder, hallucinations, memory difficulties, mood swings or suicidal ideation Ophthalmic ROS: negative for - blurry vision, double vision, eye pain or loss of vision ENT ROS: negative for - as noted in present illness Allergy and Immunology ROS: negative for - hives or itchy/watery eyes Hematological and Lymphatic ROS: negative for - bleeding problems, bruising or swollen lymph nodes Endocrine ROS: negative for - galactorrhea, hair pattern changes, polydipsia/polyuria or temperature intolerance Respiratory ROS: negative for - cough, hemoptysis, shortness of breath or wheezing Cardiovascular ROS: negative for - chest pain, dyspnea on exertion, edema or irregular heartbeat Gastrointestinal ROS: negative for - abdominal pain, diarrhea, hematemesis, nausea/vomiting or stool incontinence Genito-Urinary ROS: negative for - dysuria, hematuria, incontinence or urinary frequency/urgency Musculoskeletal ROS: negative for - joint swelling or muscular weakness Neurological ROS: as noted in HPI Dermatological ROS: negative for rash and skin lesion changes  Physical Examination: Blood pressure 131/74, pulse 76, temperature 98.7 F (37.1 C), temperature source Oral, resp. rate 16, height _0  (1.651 m), weight 82.555 kg (182 lb), SpO2 100 %.  HEENT-  Normocephalic, no lesions, without obvious abnormality.  Normal external eye and conjunctiva.  Normal TM's bilaterally.  Normal auditory canals and external ears. Normal external nose, mucus membranes and septum.  Normal pharynx. Neck supple with no masses, nodes, nodules or enlargement. Cardiovascular - regular rate and rhythm, S1, S2 normal, no murmur, click, rub or gallop Lungs - chest clear, no wheezing, rales,  normal symmetric air entry Abdomen - soft, non-tender; bowel sounds normal; no masses,  no organomegaly Extremities - no joint deformities, effusion, or inflammation and no  edema  Neurologic Examination: Mental Status: Alert, oriented, thought content appropriate.  Speech fluent without evidence of aphasia. Able to follow commands without difficulty. Cranial Nerves: II-Visual fields were normal. III/IV/VI-Pupils were equal and reacted normally to light. Extraocular movements were full and conjugate.    V/VII-no facial numbness and no facial weakness. VIII-normal. X-normal speech and symmetrical palatal movement. XI: trapezius strength/neck flexion strength normal bilaterally XII-midline tongue extension with normal strength. Motor: 5/5 bilaterally with normal tone and bulk Sensory: Normal throughout. Deep Tendon Reflexes: 1+ and symmetric. Plantars: Flexor bilaterally Cerebellar: Normal finger-to-nose testing. Carotid auscultation: Normal  Results for orders placed or performed during the hospital encounter of 08/10/15 (from the past 48 hour(s))  CBG monitoring, ED     Status: Abnormal   Collection Time: 08/10/15  8:48 PM  Result Value Ref Range   Glucose-Capillary 125 (H) 65 - 99 mg/dL  Protime-INR     Status: None   Collection Time: 08/10/15  9:08 PM  Result Value Ref Range   Prothrombin Time 14.6 11.6 - 15.2 seconds   INR 1.12 0.00 - 1.49  APTT     Status: None   Collection Time: 08/10/15  9:08 PM  Result Value Ref Range   aPTT 37 24 - 37 seconds    Comment:        IF BASELINE aPTT IS ELEVATED, SUGGEST PATIENT RISK ASSESSMENT BE USED TO DETERMINE APPROPRIATE ANTICOAGULANT THERAPY.   CBC     Status: Abnormal   Collection Time: 08/10/15  9:08 PM  Result Value Ref Range   WBC 9.2 4.0 - 10.5 K/uL   RBC 4.22 3.87 - 5.11 MIL/uL   Hemoglobin 10.9 (L) 12.0 - 15.0 g/dL   HCT 34.6 (L) 36.0 - 46.0 %   MCV 82.0 78.0 - 100.0 fL   MCH 25.8 (L) 26.0 - 34.0 pg   MCHC 31.5 30.0 - 36.0 g/dL   RDW 14.0 11.5 - 15.5 %   Platelets 321 150 - 400 K/uL  Differential     Status: None   Collection Time: 08/10/15  9:08 PM  Result Value Ref Range    Neutrophils Relative % 62 %   Neutro Abs 5.6 1.7 - 7.7 K/uL   Lymphocytes Relative 30 %   Lymphs Abs 2.8 0.7 - 4.0 K/uL   Monocytes Relative 7 %   Monocytes Absolute 0.7 0.1 - 1.0 K/uL   Eosinophils Relative 1 %   Eosinophils Absolute 0.1 0.0 - 0.7 K/uL   Basophils Relative 0 %   Basophils Absolute 0.0 0.0 - 0.1 K/uL  Comprehensive metabolic panel     Status: Abnormal   Collection Time: 08/10/15  9:08 PM  Result Value Ref Range   Sodium 139 135 - 145 mmol/L   Potassium 3.2 (L) 3.5 - 5.1 mmol/L   Chloride 102 101 - 111 mmol/L   CO2 24 22 - 32 mmol/L   Glucose, Bld 132 (H) 65 - 99 mg/dL   BUN 14 6 - 20 mg/dL   Creatinine, Ser 1.52 (H) 0.44 - 1.00 mg/dL   Calcium 9.1 8.9 - 10.3 mg/dL   Total Protein 8.0 6.5 - 8.1 g/dL   Albumin 3.4 (L) 3.5 - 5.0 g/dL   AST 12 (L) 15 - 41 U/L   ALT 12 (L) 14 - 54 U/L   Alkaline Phosphatase 71 38 -  126 U/L   Total Bilirubin 0.9 0.3 - 1.2 mg/dL   GFR calc non Af Amer 37 (L) >60 mL/min   GFR calc Af Amer 43 (L) >60 mL/min    Comment: (NOTE) The eGFR has been calculated using the CKD EPI equation. This calculation has not been validated in all clinical situations. eGFR's persistently <60 mL/min signify possible Chronic Kidney Disease.    Anion gap 13 5 - 15  I-stat troponin, ED (not at [facility], [facility])     Status: None   Collection Time: 08/10/15  9:17 PM  Result Value Ref Range   Troponin i, poc 0.00 0.00 - 0.08 ng/mL   Comment 3            Comment: Due to the release kinetics of cTnI, a negative result within the first hours of the onset of symptoms does not rule out myocardial infarction with certainty. If myocardial infarction is still suspected, repeat the test at appropriate intervals.   I-Stat Chem 8, ED  (not at [facility], [facility])     Status: Abnormal   Collection Time: 08/10/15  9:19 PM  Result Value Ref Range   Sodium 140 135 - 145 mmol/L   Potassium 3.4 (L) 3.5 - 5.1 mmol/L   Chloride 102 101 - 111 mmol/L   BUN 16 6 - 20 mg/dL    Creatinine, Ser 1.40 (H) 0.44 - 1.00 mg/dL   Glucose, Bld 131 (H) 65 - 99 mg/dL   Calcium, Ion 1.10 (L) 1.12 - 1.23 mmol/L   TCO2 27 0 - 100 mmol/L   Hemoglobin 12.6 12.0 - 15.0 g/dL   HCT 37.0 36.0 - 46.0 %   Ct Head Wo Contrast  08/10/2015  CLINICAL DATA:  Headache and vertigo for 2 weeks, worse since Sunday. Facial twitching. Patient has been seen by primary care physician for vertigo. EXAM: CT HEAD WITHOUT CONTRAST TECHNIQUE: Contiguous axial images were obtained from the base of the skull through the vertex without intravenous contrast. COMPARISON:  07/10/2015 FINDINGS: Ventricles and sulci appear symmetrical. No ventricular dilatation. No mass effect or midline shift. No abnormal extra-axial fluid collections. Gray-white matter junctions are distinct. Basal cisterns are not effaced. No evidence of acute intracranial hemorrhage. Calcifications along the falx and tentorium, likely dystrophic. No depressed skull fractures. Visualized paranasal sinuses and mastoid air cells are not opacified. IMPRESSION: No acute intracranial abnormalities. Electronically Signed   By: Lucienne Capers M.D.   On: 08/10/2015 22:07   Mr Virgel Paling Wo Contrast  08/11/2015  CLINICAL DATA:  60 year old female with 2 weeks of headache and vertigo, exacerbation for 3 days with new left facial twitching. Initial encounter. EXAM: MRI HEAD WITHOUT AND WITH CONTRAST MRA HEAD WITHOUT CONTRAST MRA NECK WITHOUT AND WITH CONTRAST TECHNIQUE: Multiplanar, multiecho pulse sequences of the brain and surrounding structures were obtained without and with intravenous contrast. Angiographic images of the Circle of Willis were obtained using MRA technique without intravenous contrast. Angiographic images of the neck were obtained using MRA technique without and with intravenous contrast. Carotid stenosis measurements (when applicable) are obtained utilizing NASCET criteria, using the distal internal carotid diameter as the denominator. CONTRAST:   52m MULTIHANCE GADOBENATE DIMEGLUMINE 529 MG/ML IV SOLN COMPARISON:  Head CT without contrast 08/10/2015 and earlier. FINDINGS: MRI HEAD FINDINGS Mild scaphocephaly. Cerebral volume is within normal limits. No restricted diffusion to suggest acute infarction. No midline shift, mass effect, evidence of mass lesion, ventriculomegaly, extra-axial collection or acute intracranial hemorrhage. Cervicomedullary junction and pituitary are within normal  limits. Negative visualized cervical spine. Major intracranial vascular flow voids are within normal limits. Minimal to mild for age mostly superior frontal gyrus subcortical white matter T2 and FLAIR hyperintensity. Negative gray and white matter signal elsewhere. No cortical encephalomalacia or chronic cerebral blood products. No abnormal enhancement identified. Visible internal auditory structures appear normal. However, there might be in some mass effect related to distal vertebral artery tortuosity at the left cerebellopontine angle near the seventh nerve root entry zone, uncertain (series 4, image 13). Mastoids are clear. Paranasal sinuses are clear. Negative orbit and scalp soft tissues. Visualized bone marrow signal is within normal limits. MRA NECK FINDINGS Precontrast time-of-flight imaging reveals antegrade flow in both carotid and vertebral arteries throughout the neck. Post-contrast MRA images reveal a 3 vessel arch configuration with normal great vessel origins. Normal right CCA and right carotid bifurcation. Moderately tortuous but otherwise normal cervical right ICA. Mildly to moderately tortuous left CCA. Negative left carotid bifurcation. Mildly tortuous cervical left ICA. No proximal subclavian artery stenosis. Both vertebral artery origins are normal. Mildly tortuous V1 segments. Codominant vertebral arteries. V2, V3, and distal vertebral arteries appear within normal limits. MRA HEAD FINDINGS Antegrade flow in the posterior circulation with codominant  distal vertebral arteries which appear normal. Normal PICA origins. Tortuous but otherwise normal vertebrobasilar junction and basilar artery. Posterior communicating arteries are diminutive or absent. Normal SCA and PCA origins. Bilateral PCA branches are within normal limits. Antegrade flow in both ICA siphons. No siphon stenosis. Normal left ophthalmic artery origin. Normal carotid termini. Normal MCA and ACA origins. The anterior communicating artery might be fenestrated (series 6, image 102). Visualized bilateral ACA branches are within normal limits. MCA M1 segments, bifurcations, and visualized bilateral MCA branches are within normal limits. IMPRESSION: 1. No acute intracranial abnormality, see # 3. Minimal for age nonspecific frontal lobe white matter signal changes, most commonly due to chronic small vessel disease. 2. Negative MRA Head and Neck aside from vessel tortuosity. 3. There might be some vascular tortuosity related in mass effect at the left seventh nerve root entry zone. If the facial twitching is always unilaterally on the left then consider the possibility of a vascular compression related left hemifacial spasm. Electronically Signed   By: Genevie Ann M.D.   On: 08/11/2015 10:06   Mr Angiogram Neck W Wo Contrast  08/11/2015  CLINICAL DATA:  60 year old female with 2 weeks of headache and vertigo, exacerbation for 3 days with new left facial twitching. Initial encounter. EXAM: MRI HEAD WITHOUT AND WITH CONTRAST MRA HEAD WITHOUT CONTRAST MRA NECK WITHOUT AND WITH CONTRAST TECHNIQUE: Multiplanar, multiecho pulse sequences of the brain and surrounding structures were obtained without and with intravenous contrast. Angiographic images of the Circle of Willis were obtained using MRA technique without intravenous contrast. Angiographic images of the neck were obtained using MRA technique without and with intravenous contrast. Carotid stenosis measurements (when applicable) are obtained utilizing  NASCET criteria, using the distal internal carotid diameter as the denominator. CONTRAST:  50m MULTIHANCE GADOBENATE DIMEGLUMINE 529 MG/ML IV SOLN COMPARISON:  Head CT without contrast 08/10/2015 and earlier. FINDINGS: MRI HEAD FINDINGS Mild scaphocephaly. Cerebral volume is within normal limits. No restricted diffusion to suggest acute infarction. No midline shift, mass effect, evidence of mass lesion, ventriculomegaly, extra-axial collection or acute intracranial hemorrhage. Cervicomedullary junction and pituitary are within normal limits. Negative visualized cervical spine. Major intracranial vascular flow voids are within normal limits. Minimal to mild for age mostly superior frontal gyrus subcortical white matter T2 and  FLAIR hyperintensity. Negative gray and white matter signal elsewhere. No cortical encephalomalacia or chronic cerebral blood products. No abnormal enhancement identified. Visible internal auditory structures appear normal. However, there might be in some mass effect related to distal vertebral artery tortuosity at the left cerebellopontine angle near the seventh nerve root entry zone, uncertain (series 4, image 13). Mastoids are clear. Paranasal sinuses are clear. Negative orbit and scalp soft tissues. Visualized bone marrow signal is within normal limits. MRA NECK FINDINGS Precontrast time-of-flight imaging reveals antegrade flow in both carotid and vertebral arteries throughout the neck. Post-contrast MRA images reveal a 3 vessel arch configuration with normal great vessel origins. Normal right CCA and right carotid bifurcation. Moderately tortuous but otherwise normal cervical right ICA. Mildly to moderately tortuous left CCA. Negative left carotid bifurcation. Mildly tortuous cervical left ICA. No proximal subclavian artery stenosis. Both vertebral artery origins are normal. Mildly tortuous V1 segments. Codominant vertebral arteries. V2, V3, and distal vertebral arteries appear within  normal limits. MRA HEAD FINDINGS Antegrade flow in the posterior circulation with codominant distal vertebral arteries which appear normal. Normal PICA origins. Tortuous but otherwise normal vertebrobasilar junction and basilar artery. Posterior communicating arteries are diminutive or absent. Normal SCA and PCA origins. Bilateral PCA branches are within normal limits. Antegrade flow in both ICA siphons. No siphon stenosis. Normal left ophthalmic artery origin. Normal carotid termini. Normal MCA and ACA origins. The anterior communicating artery might be fenestrated (series 6, image 102). Visualized bilateral ACA branches are within normal limits. MCA M1 segments, bifurcations, and visualized bilateral MCA branches are within normal limits. IMPRESSION: 1. No acute intracranial abnormality, see # 3. Minimal for age nonspecific frontal lobe white matter signal changes, most commonly due to chronic small vessel disease. 2. Negative MRA Head and Neck aside from vessel tortuosity. 3. There might be some vascular tortuosity related in mass effect at the left seventh nerve root entry zone. If the facial twitching is always unilaterally on the left then consider the possibility of a vascular compression related left hemifacial spasm. Electronically Signed   By: Genevie Ann M.D.   On: 08/11/2015 10:06   Mr [name] EU Contrast  08/11/2015  CLINICAL DATA:  60 year old female with 2 weeks of headache and vertigo, exacerbation for 3 days with new left facial twitching. Initial encounter. EXAM: MRI HEAD WITHOUT AND WITH CONTRAST MRA HEAD WITHOUT CONTRAST MRA NECK WITHOUT AND WITH CONTRAST TECHNIQUE: Multiplanar, multiecho pulse sequences of the brain and surrounding structures were obtained without and with intravenous contrast. Angiographic images of the Circle of Willis were obtained using MRA technique without intravenous contrast. Angiographic images of the neck were obtained using MRA technique without and with intravenous  contrast. Carotid stenosis measurements (when applicable) are obtained utilizing NASCET criteria, using the distal internal carotid diameter as the denominator. CONTRAST:  69m MULTIHANCE GADOBENATE DIMEGLUMINE 529 MG/ML IV SOLN COMPARISON:  Head CT without contrast 08/10/2015 and earlier. FINDINGS: MRI HEAD FINDINGS Mild scaphocephaly. Cerebral volume is within normal limits. No restricted diffusion to suggest acute infarction. No midline shift, mass effect, evidence of mass lesion, ventriculomegaly, extra-axial collection or acute intracranial hemorrhage. Cervicomedullary junction and pituitary are within normal limits. Negative visualized cervical spine. Major intracranial vascular flow voids are within normal limits. Minimal to mild for age mostly superior frontal gyrus subcortical white matter T2 and FLAIR hyperintensity. Negative gray and white matter signal elsewhere. No cortical encephalomalacia or chronic cerebral blood products. No abnormal enhancement identified. Visible internal auditory structures appear normal. However, there  might be in some mass effect related to distal vertebral artery tortuosity at the left cerebellopontine angle near the seventh nerve root entry zone, uncertain (series 4, image 13). Mastoids are clear. Paranasal sinuses are clear. Negative orbit and scalp soft tissues. Visualized bone marrow signal is within normal limits. MRA NECK FINDINGS Precontrast time-of-flight imaging reveals antegrade flow in both carotid and vertebral arteries throughout the neck. Post-contrast MRA images reveal a 3 vessel arch configuration with normal great vessel origins. Normal right CCA and right carotid bifurcation. Moderately tortuous but otherwise normal cervical right ICA. Mildly to moderately tortuous left CCA. Negative left carotid bifurcation. Mildly tortuous cervical left ICA. No proximal subclavian artery stenosis. Both vertebral artery origins are normal. Mildly tortuous V1 segments.  Codominant vertebral arteries. V2, V3, and distal vertebral arteries appear within normal limits. MRA HEAD FINDINGS Antegrade flow in the posterior circulation with codominant distal vertebral arteries which appear normal. Normal PICA origins. Tortuous but otherwise normal vertebrobasilar junction and basilar artery. Posterior communicating arteries are diminutive or absent. Normal SCA and PCA origins. Bilateral PCA branches are within normal limits. Antegrade flow in both ICA siphons. No siphon stenosis. Normal left ophthalmic artery origin. Normal carotid termini. Normal MCA and ACA origins. The anterior communicating artery might be fenestrated (series 6, image 102). Visualized bilateral ACA branches are within normal limits. MCA M1 segments, bifurcations, and visualized bilateral MCA branches are within normal limits. IMPRESSION: 1. No acute intracranial abnormality, see # 3. Minimal for age nonspecific frontal lobe white matter signal changes, most commonly due to chronic small vessel disease. 2. Negative MRA Head and Neck aside from vessel tortuosity. 3. There might be some vascular tortuosity related in mass effect at the left seventh nerve root entry zone. If the facial twitching is always unilaterally on the left then consider the possibility of a vascular compression related left hemifacial spasm. Electronically Signed   By: Genevie Ann M.D.   On: 08/11/2015 10:06    Assessment/Plan 60 year old lady presenting with recurrent episodes of vertigo over the past 1 month, etiology of which is likely peripheral (labyrinth) in origin. TIAs are unlikely with no associated focal neurologic deficits. Patient is also giving history of an episode of loss of consciousness, most likely syncopal in nature. The significance of left facial twitching and tingling is unclear. Seizures unlikely but cannot be completely ruled out.  Recommendations: 1. Admit for observation overnight 2. Continue meclizine for management of  vertigo 3. Diazepam for management of vertigo as well, 1-2 mg every 6 hours when necessary 4. EEG, routine about starting 5. Physical therapy consult for vestibular training  We will continue to follow this patient with you.   C.R. Nicole Kindred, MD [facility] [phone]  08/11/2015, 10:38 AM

## 2015-08-11 NOTE — ED Notes (Signed)
PA at bedside.

## 2015-08-11 NOTE — ED Notes (Signed)
Patient transported to MRI 

## 2015-08-11 NOTE — ED Notes (Signed)
Report given to Emily, RN.

## 2015-08-11 NOTE — Progress Notes (Signed)
Offsite EEG completed at [facility]. Results pending. 

## 2015-08-11 NOTE — Procedures (Signed)
ELECTROENCEPHALOGRAM REPORT  Patient: Heather Leach       Room #: [id number] EEG No. ID: [id number] Age: 60 y.o.        Sex: female Referring Physician: Derrek Gu Report Date:  08/11/2015        Interpreting Physician: Anthony Sar  History: Heather Leach is an 60 y.o. female who has been experiencing vertigo for one month, in addition to an episode of loss of consciousness with urinary incontinence, and one occurrence of brief twitching and tingling involving left side of her face.  Indications for study:  Rule out seizure disorder  Technique: This is an 18 channel routine scalp EEG performed at the bedside with bipolar and monopolar montages arranged in accordance to the international 10/20 system of electrode placement.   Description: This EEG recording was performed during wakefulness and during drowsiness. Predominant activity during wakefulness consisted of 10  Hz alpha rhythm with good attenuation with eye opening. Photic stimulation produced symmetrical occipital driving response.  Hyperventilation produced normal symmetrical generalized slowing response. During drowsiness there was mild slowing of cerebral activity diffusely as well as intermittent occurrences of moderate to slightly high amplitude mixed diffuse delta and theta activity. No epileptiform discharges were recorded. There was no abnormal slowing of cerebral activity.  Interpretation: This is a normal EEG recording during wakefulness and during drowsiness. No evidence of an epileptic disorder was demonstrated. However, a normal EEG recording in and of itself does not rule out seizure disorder.   Interpretation:    Rush Farmer M.D. [facility] [phone]

## 2015-08-11 NOTE — ED Notes (Signed)
Patient ambulated to bed without difficulty.

## 2015-08-12 ENCOUNTER — Observation Stay (HOSPITAL_COMMUNITY): Payer: BLUE CROSS/BLUE SHIELD

## 2015-08-12 DIAGNOSIS — N183 Chronic kidney disease, stage 3 (moderate): Secondary | ICD-10-CM | POA: Diagnosis not present

## 2015-08-12 DIAGNOSIS — R42 Dizziness and giddiness: Secondary | ICD-10-CM | POA: Diagnosis not present

## 2015-08-12 DIAGNOSIS — B9620 Unspecified Escherichia coli [E. coli] as the cause of diseases classified elsewhere: Secondary | ICD-10-CM | POA: Diagnosis not present

## 2015-08-12 DIAGNOSIS — E876 Hypokalemia: Secondary | ICD-10-CM | POA: Diagnosis not present

## 2015-08-12 DIAGNOSIS — N390 Urinary tract infection, site not specified: Secondary | ICD-10-CM | POA: Diagnosis not present

## 2015-08-12 DIAGNOSIS — R55 Syncope and collapse: Secondary | ICD-10-CM | POA: Diagnosis not present

## 2015-08-12 DIAGNOSIS — R509 Fever, unspecified: Secondary | ICD-10-CM | POA: Diagnosis not present

## 2015-08-12 LAB — COMPREHENSIVE METABOLIC PANEL
ALBUMIN: 2.8 g/dL — AB (ref 3.5–5.0)
ALT: 9 U/L — ABNORMAL LOW (ref 14–54)
ANION GAP: 9 (ref 5–15)
AST: 9 U/L — ABNORMAL LOW (ref 15–41)
Alkaline Phosphatase: 58 U/L (ref 38–126)
BILIRUBIN TOTAL: 0.5 mg/dL (ref 0.3–1.2)
BUN: 11 mg/dL (ref 6–20)
CO2: 21 mmol/L — ABNORMAL LOW (ref 22–32)
Calcium: 9.1 mg/dL (ref 8.9–10.3)
Chloride: 116 mmol/L — ABNORMAL HIGH (ref 101–111)
Creatinine, Ser: 1.18 mg/dL — ABNORMAL HIGH (ref 0.44–1.00)
GFR, EST AFRICAN AMERICAN: 58 mL/min — AB (ref >60)
GFR, EST NON AFRICAN AMERICAN: 50 mL/min — AB (ref >60)
Glucose, Bld: 160 mg/dL — ABNORMAL HIGH (ref 65–99)
POTASSIUM: 4.3 mmol/L (ref 3.5–5.1)
Sodium: 146 mmol/L — ABNORMAL HIGH (ref 135–145)
TOTAL PROTEIN: 7.1 g/dL (ref 6.5–8.1)

## 2015-08-12 LAB — GLUCOSE, CAPILLARY
GLUCOSE-CAPILLARY: 159 mg/dL — AB (ref 65–99)
GLUCOSE-CAPILLARY: 117 mg/dL — AB (ref 65–99)
GLUCOSE-CAPILLARY: 146 mg/dL — AB (ref 65–99)

## 2015-08-12 LAB — CBC
HEMATOCRIT: 33.5 % — AB (ref 36.0–46.0)
Hemoglobin: 10.5 g/dL — ABNORMAL LOW (ref 12.0–15.0)
MCH: 26.2 pg (ref 26.0–34.0)
MCHC: 31.3 g/dL (ref 30.0–36.0)
MCV: 83.5 fL (ref 78.0–100.0)
Platelets: 324 10*3/uL (ref 150–400)
RBC: 4.01 MIL/uL (ref 3.87–5.11)
RDW: 14.3 % (ref 11.5–15.5)
WBC: 7.3 10*3/uL (ref 4.0–10.5)

## 2015-08-12 LAB — HEMOGLOBIN A1C
HEMOGLOBIN A1C: 7.1 % — AB (ref 4.8–5.6)
MEAN PLASMA GLUCOSE: 157 mg/dL

## 2015-08-12 NOTE — Discharge Summary (Signed)
Physician Discharge Summary  Heather Leach [id number] DOB: 1955/10/02 DOA: 08/10/2015  PCP: Heather Maize, MD  Admit date: 08/10/2015 Discharge date: 08/12/2015  Recommendations for Outpatient Follow-up:  1. No changes in medications on discharge   Discharge Diagnoses:  Active Problems:   Syncope and collapse   Hypokalemia   Orthostatic hypotension   Vertigo    Discharge Condition: stable   Diet recommendation: as tolerated   History of present illness:  60 year old female with a history of vertigo who presented with loss of consciousness about few days prior to this admission. She also reported feeling lightheaded occasionally. On admission, she was hemodynamically stable. She was admitted for further evaluation of syncopal event. Troponin level was within normal limits. CT head and MRI did not show acute intracranial findings. She has been seen by neurology in consultation.  Hospital Course:    Assessment/Plan:    Active Problems:  Syncope and collapse - Unclear etiology - No seizure activity on EEG - MRI with no acute intracranial findings - Per neurology no further workup indicated - PT evaluation - outpt OT   Hypokalemia - Normalized subsequently   DVT Prophylaxis  - Lovenox subQ in hospital   Code Status: Full.  Family Communication: plan of care discussed with the patient   IV access:  Peripheral IV  Procedures and diagnostic studies:   Ct Head Wo Contrast 08/10/2015 No acute intracranial abnormalities. Electronically Signed By: Heather Leach M.D. On: 08/10/2015 22:07   Mr [name] Head Wo Contrast 08/11/2015 1. No acute intracranial abnormality, see # 3. Minimal for age nonspecific frontal lobe white matter signal changes, most commonly due to chronic small vessel disease. 2. Negative MRA Head and Neck aside from vessel tortuosity. 3. There might be some vascular tortuosity related in mass effect at the left seventh nerve root entry  zone. If the facial twitching is always unilaterally on the left then consider the possibility of a vascular compression related left hemifacial spasm. Electronically Signed By: Heather Leach M.D. On: 08/11/2015 10:06   Mr Angiogram Neck W Wo Contrast 08/11/2015 1. No acute intracranial abnormality, see # 3. Minimal for age nonspecific frontal lobe white matter signal changes, most commonly due to chronic small vessel disease. 2. Negative MRA Head and Neck aside from vessel tortuosity. 3. There might be some vascular tortuosity related in mass effect at the left seventh nerve root entry zone. If the facial twitching is always unilaterally on the left then consider the possibility of a vascular compression related left hemifacial spasm. Electronically Signed By: Heather Leach M.D. On: 08/11/2015 10:06   Mr [name] [id number] Contrast 08/11/2015 1. No acute intracranial abnormality, see # 3. Minimal for age nonspecific frontal lobe white matter signal changes, most commonly due to chronic small vessel disease. 2. Negative MRA Head and Neck aside from vessel tortuosity. 3. There might be some vascular tortuosity related in mass effect at the left seventh nerve root entry zone. If the facial twitching is always unilaterally on the left then consider the possibility of a vascular compression related left hemifacial spasm. Electronically Signed By: Heather Leach M.D. On: 08/11/2015 10:06    Medical Consultants:  Neurology  Other Consultants:  PT  IAnti-Infectives:   None  Signed:  Leisa Lenz, MD  [facility] 08/12/2015, 6:13 PM  Pager #: [phone]  Time spent in minutes: more than 30 minutes    Discharge Exam: Filed Vitals:   08/12/15 0631 08/12/15 1435  BP: 112/61 132/68  Pulse: 78 77  Temp:  98.9 F (37.2 C) 99.1 F (37.3 C)  Resp: 20 16   Filed Vitals:   08/11/15 1715 08/11/15 2158 08/12/15 0631 08/12/15 1435  BP: 119/62 112/59 112/61 132/68  Pulse:  79 78 77  Temp: 98.3 F  (36.8 C) 99.7 F (37.6 C) 98.9 F (37.2 C) 99.1 F (37.3 C)  TempSrc: Oral Oral Oral Oral  Resp: 20 20 20 16   Height: 5\' 5"  (1.651 m)     Weight: 82.555 kg (182 lb)     SpO2: 98% 99% 97% 100%    General: Pt is alert, follows commands appropriately, not in acute distress Cardiovascular: Regular rate and rhythm, S1/S2 +, no murmurs Respiratory: Clear to auscultation bilaterally, no wheezing, no crackles, no rhonchi Abdominal: Soft, non tender, non distended, bowel sounds +, no guarding Extremities: no edema, no cyanosis, pulses palpable bilaterally DP and PT Neuro: Grossly nonfocal  Discharge Instructions  Discharge Instructions    Call MD for:  difficulty breathing, headache or visual disturbances    Complete by:  As directed      Call MD for:  persistant dizziness or light-headedness    Complete by:  As directed      Call MD for:  persistant nausea and vomiting    Complete by:  As directed      Call MD for:  severe uncontrolled pain    Complete by:  As directed      Diet - low sodium heart healthy    Complete by:  As directed      Increase activity slowly    Complete by:  As directed             Medication List    STOP taking these medications        fluticasone 50 MCG/ACT nasal spray  Commonly known as:  FLONASE     oseltamivir 75 MG capsule  Commonly known as:  TAMIFLU      TAKE these medications        meclizine 25 MG tablet  Commonly known as:  ANTIVERT  Take 25 mg by mouth 3 (three) times daily as needed for dizziness.           Follow-up Information    Follow up with Heather Maize, MD. Schedule an appointment as soon as possible for a visit in 1 week.   Specialty:  Pediatrics   Why:  Follow up appt after recent hospitalization   Contact information:   [place] [id number] [phone]        The results of significant diagnostics from this hospitalization (including imaging, microbiology, ancillary and laboratory) are listed  below for reference.    Significant Diagnostic Studies: Ct Head Wo Contrast  08/10/2015  CLINICAL DATA:  Headache and vertigo for 2 weeks, worse since Sunday. Facial twitching. Patient has been seen by primary care physician for vertigo. EXAM: CT HEAD WITHOUT CONTRAST TECHNIQUE: Contiguous axial images were obtained from the base of the skull through the vertex without intravenous contrast. COMPARISON:  07/10/2015 FINDINGS: Ventricles and sulci appear symmetrical. No ventricular dilatation. No mass effect or midline shift. No abnormal extra-axial fluid collections. Gray-white matter junctions are distinct. Basal cisterns are not effaced. No evidence of acute intracranial hemorrhage. Calcifications along the falx and tentorium, likely dystrophic. No depressed skull fractures. Visualized paranasal sinuses and mastoid air cells are not opacified. IMPRESSION: No acute intracranial abnormalities. Electronically Signed   By: Heather Leach M.D.   On: 08/10/2015 22:07  Mr Virgel Paling Wo Contrast  08/11/2015  CLINICAL DATA:  60 year old female with 2 weeks of headache and vertigo, exacerbation for 3 days with new left facial twitching. Initial encounter. EXAM: MRI HEAD WITHOUT AND WITH CONTRAST MRA HEAD WITHOUT CONTRAST MRA NECK WITHOUT AND WITH CONTRAST TECHNIQUE: Multiplanar, multiecho pulse sequences of the brain and surrounding structures were obtained without and with intravenous contrast. Angiographic images of the Circle of Willis were obtained using MRA technique without intravenous contrast. Angiographic images of the neck were obtained using MRA technique without and with intravenous contrast. Carotid stenosis measurements (when applicable) are obtained utilizing NASCET criteria, using the distal internal carotid diameter as the denominator. CONTRAST:  6mL MULTIHANCE GADOBENATE DIMEGLUMINE 529 MG/ML IV SOLN COMPARISON:  Head CT without contrast 08/10/2015 and earlier. FINDINGS: MRI HEAD FINDINGS Mild  scaphocephaly. Cerebral volume is within normal limits. No restricted diffusion to suggest acute infarction. No midline shift, mass effect, evidence of mass lesion, ventriculomegaly, extra-axial collection or acute intracranial hemorrhage. Cervicomedullary junction and pituitary are within normal limits. Negative visualized cervical spine. Major intracranial vascular flow voids are within normal limits. Minimal to mild for age mostly superior frontal gyrus subcortical white matter T2 and FLAIR hyperintensity. Negative gray and white matter signal elsewhere. No cortical encephalomalacia or chronic cerebral blood products. No abnormal enhancement identified. Visible internal auditory structures appear normal. However, there might be in some mass effect related to distal vertebral artery tortuosity at the left cerebellopontine angle near the seventh nerve root entry zone, uncertain (series 4, image 13). Mastoids are clear. Paranasal sinuses are clear. Negative orbit and scalp soft tissues. Visualized bone marrow signal is within normal limits. MRA NECK FINDINGS Precontrast time-of-flight imaging reveals antegrade flow in both carotid and vertebral arteries throughout the neck. Post-contrast MRA images reveal a 3 vessel arch configuration with normal great vessel origins. Normal right CCA and right carotid bifurcation. Moderately tortuous but otherwise normal cervical right ICA. Mildly to moderately tortuous left CCA. Negative left carotid bifurcation. Mildly tortuous cervical left ICA. No proximal subclavian artery stenosis. Both vertebral artery origins are normal. Mildly tortuous V1 segments. Codominant vertebral arteries. V2, V3, and distal vertebral arteries appear within normal limits. MRA HEAD FINDINGS Antegrade flow in the posterior circulation with codominant distal vertebral arteries which appear normal. Normal PICA origins. Tortuous but otherwise normal vertebrobasilar junction and basilar artery. Posterior  communicating arteries are diminutive or absent. Normal SCA and PCA origins. Bilateral PCA branches are within normal limits. Antegrade flow in both ICA siphons. No siphon stenosis. Normal left ophthalmic artery origin. Normal carotid termini. Normal MCA and ACA origins. The anterior communicating artery might be fenestrated (series 6, image 102). Visualized bilateral ACA branches are within normal limits. MCA M1 segments, bifurcations, and visualized bilateral MCA branches are within normal limits. IMPRESSION: 1. No acute intracranial abnormality, see # 3. Minimal for age nonspecific frontal lobe white matter signal changes, most commonly due to chronic small vessel disease. 2. Negative MRA Head and Neck aside from vessel tortuosity. 3. There might be some vascular tortuosity related in mass effect at the left seventh nerve root entry zone. If the facial twitching is always unilaterally on the left then consider the possibility of a vascular compression related left hemifacial spasm. Electronically Signed   By: Heather Leach M.D.   On: 08/11/2015 10:06   Mr Angiogram Neck W Wo Contrast  08/11/2015  CLINICAL DATA:  60 year old female with 2 weeks of headache and vertigo, exacerbation for 3 days with new left facial  twitching. Initial encounter. EXAM: MRI HEAD WITHOUT AND WITH CONTRAST MRA HEAD WITHOUT CONTRAST MRA NECK WITHOUT AND WITH CONTRAST TECHNIQUE: Multiplanar, multiecho pulse sequences of the brain and surrounding structures were obtained without and with intravenous contrast. Angiographic images of the Circle of Willis were obtained using MRA technique without intravenous contrast. Angiographic images of the neck were obtained using MRA technique without and with intravenous contrast. Carotid stenosis measurements (when applicable) are obtained utilizing NASCET criteria, using the distal internal carotid diameter as the denominator. CONTRAST:  70mL MULTIHANCE GADOBENATE DIMEGLUMINE 529 MG/ML IV SOLN  COMPARISON:  Head CT without contrast 08/10/2015 and earlier. FINDINGS: MRI HEAD FINDINGS Mild scaphocephaly. Cerebral volume is within normal limits. No restricted diffusion to suggest acute infarction. No midline shift, mass effect, evidence of mass lesion, ventriculomegaly, extra-axial collection or acute intracranial hemorrhage. Cervicomedullary junction and pituitary are within normal limits. Negative visualized cervical spine. Major intracranial vascular flow voids are within normal limits. Minimal to mild for age mostly superior frontal gyrus subcortical white matter T2 and FLAIR hyperintensity. Negative gray and white matter signal elsewhere. No cortical encephalomalacia or chronic cerebral blood products. No abnormal enhancement identified. Visible internal auditory structures appear normal. However, there might be in some mass effect related to distal vertebral artery tortuosity at the left cerebellopontine angle near the seventh nerve root entry zone, uncertain (series 4, image 13). Mastoids are clear. Paranasal sinuses are clear. Negative orbit and scalp soft tissues. Visualized bone marrow signal is within normal limits. MRA NECK FINDINGS Precontrast time-of-flight imaging reveals antegrade flow in both carotid and vertebral arteries throughout the neck. Post-contrast MRA images reveal a 3 vessel arch configuration with normal great vessel origins. Normal right CCA and right carotid bifurcation. Moderately tortuous but otherwise normal cervical right ICA. Mildly to moderately tortuous left CCA. Negative left carotid bifurcation. Mildly tortuous cervical left ICA. No proximal subclavian artery stenosis. Both vertebral artery origins are normal. Mildly tortuous V1 segments. Codominant vertebral arteries. V2, V3, and distal vertebral arteries appear within normal limits. MRA HEAD FINDINGS Antegrade flow in the posterior circulation with codominant distal vertebral arteries which appear normal. Normal PICA  origins. Tortuous but otherwise normal vertebrobasilar junction and basilar artery. Posterior communicating arteries are diminutive or absent. Normal SCA and PCA origins. Bilateral PCA branches are within normal limits. Antegrade flow in both ICA siphons. No siphon stenosis. Normal left ophthalmic artery origin. Normal carotid termini. Normal MCA and ACA origins. The anterior communicating artery might be fenestrated (series 6, image 102). Visualized bilateral ACA branches are within normal limits. MCA M1 segments, bifurcations, and visualized bilateral MCA branches are within normal limits. IMPRESSION: 1. No acute intracranial abnormality, see # 3. Minimal for age nonspecific frontal lobe white matter signal changes, most commonly due to chronic small vessel disease. 2. Negative MRA Head and Neck aside from vessel tortuosity. 3. There might be some vascular tortuosity related in mass effect at the left seventh nerve root entry zone. If the facial twitching is always unilaterally on the left then consider the possibility of a vascular compression related left hemifacial spasm. Electronically Signed   By: Heather Leach M.D.   On: 08/11/2015 10:06   Mr [name] [id number] Contrast  08/11/2015  CLINICAL DATA:  60 year old female with 2 weeks of headache and vertigo, exacerbation for 3 days with new left facial twitching. Initial encounter. EXAM: MRI HEAD WITHOUT AND WITH CONTRAST MRA HEAD WITHOUT CONTRAST MRA NECK WITHOUT AND WITH CONTRAST TECHNIQUE: Multiplanar, multiecho pulse sequences of the brain and  surrounding structures were obtained without and with intravenous contrast. Angiographic images of the Circle of Willis were obtained using MRA technique without intravenous contrast. Angiographic images of the neck were obtained using MRA technique without and with intravenous contrast. Carotid stenosis measurements (when applicable) are obtained utilizing NASCET criteria, using the distal internal carotid diameter as the  denominator. CONTRAST:  46mL MULTIHANCE GADOBENATE DIMEGLUMINE 529 MG/ML IV SOLN COMPARISON:  Head CT without contrast 08/10/2015 and earlier. FINDINGS: MRI HEAD FINDINGS Mild scaphocephaly. Cerebral volume is within normal limits. No restricted diffusion to suggest acute infarction. No midline shift, mass effect, evidence of mass lesion, ventriculomegaly, extra-axial collection or acute intracranial hemorrhage. Cervicomedullary junction and pituitary are within normal limits. Negative visualized cervical spine. Major intracranial vascular flow voids are within normal limits. Minimal to mild for age mostly superior frontal gyrus subcortical white matter T2 and FLAIR hyperintensity. Negative gray and white matter signal elsewhere. No cortical encephalomalacia or chronic cerebral blood products. No abnormal enhancement identified. Visible internal auditory structures appear normal. However, there might be in some mass effect related to distal vertebral artery tortuosity at the left cerebellopontine angle near the seventh nerve root entry zone, uncertain (series 4, image 13). Mastoids are clear. Paranasal sinuses are clear. Negative orbit and scalp soft tissues. Visualized bone marrow signal is within normal limits. MRA NECK FINDINGS Precontrast time-of-flight imaging reveals antegrade flow in both carotid and vertebral arteries throughout the neck. Post-contrast MRA images reveal a 3 vessel arch configuration with normal great vessel origins. Normal right CCA and right carotid bifurcation. Moderately tortuous but otherwise normal cervical right ICA. Mildly to moderately tortuous left CCA. Negative left carotid bifurcation. Mildly tortuous cervical left ICA. No proximal subclavian artery stenosis. Both vertebral artery origins are normal. Mildly tortuous V1 segments. Codominant vertebral arteries. V2, V3, and distal vertebral arteries appear within normal limits. MRA HEAD FINDINGS Antegrade flow in the posterior  circulation with codominant distal vertebral arteries which appear normal. Normal PICA origins. Tortuous but otherwise normal vertebrobasilar junction and basilar artery. Posterior communicating arteries are diminutive or absent. Normal SCA and PCA origins. Bilateral PCA branches are within normal limits. Antegrade flow in both ICA siphons. No siphon stenosis. Normal left ophthalmic artery origin. Normal carotid termini. Normal MCA and ACA origins. The anterior communicating artery might be fenestrated (series 6, image 102). Visualized bilateral ACA branches are within normal limits. MCA M1 segments, bifurcations, and visualized bilateral MCA branches are within normal limits. IMPRESSION: 1. No acute intracranial abnormality, see # 3. Minimal for age nonspecific frontal lobe white matter signal changes, most commonly due to chronic small vessel disease. 2. Negative MRA Head and Neck aside from vessel tortuosity. 3. There might be some vascular tortuosity related in mass effect at the left seventh nerve root entry zone. If the facial twitching is always unilaterally on the left then consider the possibility of a vascular compression related left hemifacial spasm. Electronically Signed   By: Heather Leach M.D.   On: 08/11/2015 10:06    Microbiology: No results found for this or any previous visit (from the past 240 hour(s)).   Labs: Basic Metabolic Panel:  Recent Labs Lab 08/10/15 2108 08/10/15 2119 08/11/15 1058 08/12/15 0507  NA 139 140  --  146*  K 3.2* 3.4*  --  4.3  CL 102 102  --  116*  CO2 24  --   --  21*  GLUCOSE 132* 131*  --  160*  BUN 14 16  --  11  CREATININE  1.52* 1.40*  --  1.18*  CALCIUM 9.1  --   --  9.1  MG  --   --  2.0  --    Liver Function Tests:  Recent Labs Lab 08/10/15 2108 08/12/15 0507  AST 12* 9*  ALT 12* 9*  ALKPHOS 71 58  BILITOT 0.9 0.5  PROT 8.0 7.1  ALBUMIN 3.4* 2.8*   No results for input(s): LIPASE, AMYLASE in the last 168 hours. No results for  input(s): AMMONIA in the last 168 hours. CBC:  Recent Labs Lab 08/10/15 2108 08/10/15 2119 08/12/15 0507  WBC 9.2  --  7.3  NEUTROABS 5.6  --   --   HGB 10.9* 12.6 10.5*  HCT 34.6* 37.0 33.5*  MCV 82.0  --  83.5  PLT 321  --  324   Cardiac Enzymes:  Recent Labs Lab 08/11/15 1058 08/11/15 1615 08/11/15 2222  TROPONINI <0.03 <0.03 <0.03   BNP: BNP (last 3 results) No results for input(s): BNP in the last 8760 hours.  ProBNP (last 3 results) No results for input(s): PROBNP in the last 8760 hours.  CBG:  Recent Labs Lab 08/11/15 1141 08/11/15 1740 08/12/15 0749 08/12/15 1154 08/12/15 1703  GLUCAP 111* 89 159* 117* 146*

## 2015-08-12 NOTE — Progress Notes (Addendum)
Patient ID: Heather Leach, female   DOB: 1956/04/06, 60 y.o.   MRN: [id number] [facility] PROGRESS NOTE  Heather Leach [id number] DOB: [birth year] DOA: 08/10/2015 PCP: Eustaquio Maize, MD  Brief narrative:    60 year old female with a history of vertigo who presented with loss of consciousness about few days prior to this admission. She also reported feeling lightheaded occasionally. On admission, she was hemodynamically stable. She was admitted for further evaluation of syncopal event. Troponin level was within normal limits. CT head and MRI did not show acute intracranial findings. She has been seen by neurology in consultation.  Assessment/Plan:    Active Problems:   Syncope and collapse - Unclear etiology - No seizure activity on EEG - MRI with no acute intracranial findings - Per neurology no further workup indicated - PT evaluation and progress    Hypokalemia - Normalized subsequently   DVT Prophylaxis  - Lovenox subQ   Code Status: Full.  Family Communication:  plan of care discussed with the patient Disposition Plan: Home 2/17   IV access:  Peripheral IV  Procedures and diagnostic studies:    Ct Head Wo Contrast 08/10/2015  No acute intracranial abnormalities. Electronically Signed   By: Lucienne Capers M.D.   On: 08/10/2015 22:07   Mr [name] Head Wo Contrast 08/11/2015  1. No acute intracranial abnormality, see # 3. Minimal for age nonspecific frontal lobe white matter signal changes, most commonly due to chronic small vessel disease. 2. Negative MRA Head and Neck aside from vessel tortuosity. 3. There might be some vascular tortuosity related in mass effect at the left seventh nerve root entry zone. If the facial twitching is always unilaterally on the left then consider the possibility of a vascular compression related left hemifacial spasm. Electronically Signed   By: Genevie Ann M.D.   On: 08/11/2015 10:06   Mr Angiogram Neck W Wo Contrast 08/11/2015 1. No acute  intracranial abnormality, see # 3. Minimal for age nonspecific frontal lobe white matter signal changes, most commonly due to chronic small vessel disease. 2. Negative MRA Head and Neck aside from vessel tortuosity. 3. There might be some vascular tortuosity related in mass effect at the left seventh nerve root entry zone. If the facial twitching is always unilaterally on the left then consider the possibility of a vascular compression related left hemifacial spasm. Electronically Signed   By: Genevie Ann M.D.   On: 08/11/2015 10:06   Mr [name] [id number] Contrast 08/11/2015  1. No acute intracranial abnormality, see # 3. Minimal for age nonspecific frontal lobe white matter signal changes, most commonly due to chronic small vessel disease. 2. Negative MRA Head and Neck aside from vessel tortuosity. 3. There might be some vascular tortuosity related in mass effect at the left seventh nerve root entry zone. If the facial twitching is always unilaterally on the left then consider the possibility of a vascular compression related left hemifacial spasm. Electronically Signed   By: Genevie Ann M.D.   On: 08/11/2015 10:06    Medical Consultants:  Neurology  Other Consultants:  PT  IAnti-Infectives:   None    Leisa Lenz, MD  [facility] Pager [phone]  Time spent in minutes: 15 minutes  If 7PM-7AM, please contact night-coverage [email or web address] Password [facility] 08/12/2015, 12:07 PM      HPI/Subjective: No acute overnight events. Patient reports feeling lightheaded.   Objective: Filed Vitals:   08/11/15 1454 08/11/15 1715 08/11/15 2158 08/12/15 0631  BP: 114/70 119/62 112/59 112/61  Pulse: 78  79 78  Temp:  98.3 F (36.8 C) 99.7 F (37.6 C) 98.9 F (37.2 C)  TempSrc:  Oral Oral Oral  Resp: 21 20 20 20   Height:  5\' 5"  (1.651 m)    Weight:  82.555 kg (182 lb)    SpO2: 98% 98% 99% 97%    Intake/Output Summary (Last 24 hours) at 08/12/15 1207 Last data filed at 08/12/15 1130  Gross per 24  hour  Intake 1818.75 ml  Output   4700 ml  Net -2881.25 ml    Exam:   General:  Pt is alert, follows commands appropriately, not in acute distress  Cardiovascular: Regular rate and rhythm, S1/S2, no murmurs  Respiratory: Clear to auscultation bilaterally, no wheezing, no crackles, no rhonchi  Abdomen: Soft, non tender, non distended, bowel sounds present  Extremities: No edema, pulses DP and PT palpable bilaterally  Neuro: Grossly nonfocal  Data Reviewed: Basic Metabolic Panel:  Recent Labs Lab 08/10/15 2108 08/10/15 2119 08/11/15 1058 08/12/15 0507  NA 139 140  --  146*  K 3.2* 3.4*  --  4.3  CL 102 102  --  116*  CO2 24  --   --  21*  GLUCOSE 132* 131*  --  160*  BUN 14 16  --  11  CREATININE 1.52* 1.40*  --  1.18*  CALCIUM 9.1  --   --  9.1  MG  --   --  2.0  --    Liver Function Tests:  Recent Labs Lab 08/10/15 2108 08/12/15 0507  AST 12* 9*  ALT 12* 9*  ALKPHOS 71 58  BILITOT 0.9 0.5  PROT 8.0 7.1  ALBUMIN 3.4* 2.8*   No results for input(s): LIPASE, AMYLASE in the last 168 hours. No results for input(s): AMMONIA in the last 168 hours. CBC:  Recent Labs Lab 08/10/15 2108 08/10/15 2119 08/12/15 0507  WBC 9.2  --  7.3  NEUTROABS 5.6  --   --   HGB 10.9* 12.6 10.5*  HCT 34.6* 37.0 33.5*  MCV 82.0  --  83.5  PLT 321  --  324   Cardiac Enzymes:  Recent Labs Lab 08/11/15 1058 08/11/15 1615 08/11/15 2222  TROPONINI <0.03 <0.03 <0.03   BNP: Invalid input(s): POCBNP CBG:  Recent Labs Lab 08/10/15 2048 08/11/15 1141 08/11/15 1740 08/12/15 0749  GLUCAP 125* 111* 89 159*    No results found for this or any previous visit (from the past 240 hour(s)).   Scheduled Meds: . antiseptic oral rinse  7 mL Mouth Rinse q12n4p  . chlorhexidine  15 mL Mouth Rinse BID  . docusate sodium  100 mg Oral BID  . enoxaparin (LOVENOX) injection  40 mg Subcutaneous Q24H  . fluticasone  2 spray Each Nare Daily  . sodium chloride flush  3 mL  Intravenous Q12H   Continuous Infusions: . 0.9 % NaCl with KCl 40 mEq / L 125 mL/hr (08/12/15 [id number])

## 2015-08-12 NOTE — Progress Notes (Signed)
   08/12/15 1301  PT Time Calculation  PT Start Time (ACUTE ONLY) 1020  PT Stop Time (ACUTE ONLY) 1108  PT Time Calculation (min) (ACUTE ONLY) 48 min  PT G-Codes **NOT FOR INPATIENT CLASS**  Functional Assessment Tool Used clinical judgement  Functional Limitation Mobility: Walking and moving around  Mobility: Walking and Moving Around Current Status [id number]) CI  Mobility: Walking and Moving Around Goal Status [id number]) CI  PT General Charges  $$ ACUTE PT VISIT 1 Procedure  PT Evaluation  $PT Eval Low Complexity 1 Procedure  PT Treatments  $Gait Training 8-22 mins  $Therapeutic Activity 38-52 mins

## 2015-08-12 NOTE — Progress Notes (Signed)
Subjective: Patient had no new complaints. She is not experiencing vertigo at this point. She has had no recurrence of focal motor or sensory symptoms involving her face.  Objective: Current vital signs: BP 112/61 mmHg  Pulse 78  Temp(Src) 98.9 F (37.2 C) (Oral)  Resp 20  Ht 5\' 5"  (1.651 m)  Wt 82.555 kg (182 lb)  BMI 30.29 kg/m2  SpO2 97%  Neurologic Exam: Patient was alert and in no acute distress. Mental status was normal. Face was symmetrical. Speech was normal. Patient moved extremities fully with symmetrical strength throughout.  EEG study on 08/10/2014 was normal.  Medications: I have reviewed the patient's current medications.  Assessment/Plan: 60 year old lady presenting with recurrent episodes of vertigo over the past 1 month, etiology of which is likely peripheral (labyrinth) in origin. TIAs are unlikely with no associated focal neurologic deficits. Patient is also giving history of an episode of loss of consciousness, most likely syncopal in nature. MRI of the brain was unremarkable. The significance of left facial twitching and tingling is unclear. EEG was normal.  Recommend no changes in current management. No further neurological intervention is indicated acute basis. I will plan to see this patient in follow-up on an as-needed basis following this visit.  C.R. Nicole Kindred, MD [facility] [phone]  08/12/2015  10:57 AM

## 2015-08-12 NOTE — Evaluation (Signed)
Physical Therapy Evaluation Patient Details Name: Heather Leach MRN: [id number] DOB: 20-Jan-1956 Today's Date: 08/12/2015   History of Present Illness  60 yo female admitted with syncope, collapse. Hx of urinary incontinence.   Clinical Impression  Vestibular eval completed (see misc exercise section below)-testing negative. Initiated x 1 adaptation exercises for pt to perform. Pt walked ~200 feet without external support with Min guard assist. Pt reports she still feels a little "off balance" but not as bad as before. Recommend OP PT follow up if symptoms persist.     Follow Up Recommendations Outpatient PT (vestibular and balance training/tx if symptoms persist)    Equipment Recommendations  None recommended by PT    Recommendations for Other Services       Precautions / Restrictions Precautions Precautions: Fall Restrictions Weight Bearing Restrictions: No      Mobility  Bed Mobility Overal bed mobility: Modified Independent                Transfers Overall transfer level: Modified independent                  Ambulation/Gait Ambulation/Gait assistance: Min guard Ambulation Distance (Feet): 200 Feet Assistive device: None Gait Pattern/deviations: Decreased stride length;Step-through pattern     General Gait Details: decreased arm swing R (possibly due to IV in that arm. cued pt to walk normally). close guard for safety. Pt stated she still felt a little "off balance"  Stairs            Wheelchair Mobility    Modified Rankin (Stroke Patients Only)       Balance Overall balance assessment: History of Falls;Needs assistance         Standing balance support: During functional activity                                 Pertinent Vitals/Pain Pain Assessment: 0-10 Pain Score: 5  Pain Location: headache Pain Descriptors / Indicators: Headache Pain Intervention(s): Monitored during session    Home Living Family/patient expects  to be discharged to:: Private residence Living Arrangements: Alone   Type of Home: House Home Access: Stairs to enter Entrance Stairs-Rails: Right Entrance Stairs-Number of Steps: 3 Home Layout: One level        Prior Function Level of Independence: Independent         Comments: working full time. driving     Hand Dominance        Extremity/Trunk Assessment   Upper Extremity Assessment: Overall WFL for tasks assessed           Lower Extremity Assessment: Overall WFL for tasks assessed      Cervical / Trunk Assessment: Normal  Communication   Communication: No difficulties  Cognition Arousal/Alertness: Awake/alert Behavior During Therapy: WFL for tasks assessed/performed Overall Cognitive Status: Within Functional Limits for tasks assessed                      General Comments      Exercises Other Exercises Other Exercises: Vestibular Evaluation: smooth pursuit, saccades, gaze hold: all normal; VOR testing (-); supine head roll (-); modified hallpike dix (-), mild eyelid fluttering noted. No nystagmus or dizziness. Pt c/o some intermittent blurriness. Initiated x1 adaptation exercises.-pt tolerated those well .       Assessment/Plan    PT Assessment Patient needs continued PT services  PT Diagnosis Difficulty walking   PT Problem List Decreased  balance;Other (comment) (possible vestibular training/tx)  PT Treatment Interventions Gait training;Functional mobility training;Therapeutic activities;Patient/family education;Balance training;Therapeutic exercise   PT Goals (Current goals can be found in the Care Plan section) Acute Rehab PT Goals Patient Stated Goal: regain PLOF PT Goal Formulation: With patient Time For Goal Achievement: 08/26/15 Potential to Achieve Goals: Good    Frequency Min 3X/week   Barriers to discharge        Co-evaluation               End of Session Equipment Utilized During Treatment: Gait belt Activity  Tolerance: Patient tolerated treatment well Patient left: in chair;with call bell/phone within reach;with chair alarm set      Functional Assessment Tool Used: clinical judgement    Time: 1020-1108 PT Time Calculation (min) (ACUTE ONLY): 48 min   Charges:   PT Evaluation $PT Eval Low Complexity: 1 Procedure PT Treatments $Gait Training: 8-22 mins $Therapeutic Activity: 38-52 mins   PT G Codes:   PT G-Codes **NOT FOR INPATIENT CLASS** Functional Assessment Tool Used: clinical judgement    Heather Leach, MPT Pager: [phone]

## 2015-08-12 NOTE — Discharge Instructions (Addendum)
Syncope °Syncope is a medical term for fainting or passing out. This means you lose consciousness and drop to the ground. People are generally unconscious for less than 5 minutes. You may have some muscle twitches for up to 15 seconds before waking up and returning to normal. Syncope occurs more often in older adults, but it can happen to anyone. While most causes of syncope are not dangerous, syncope can be a sign of a serious medical problem. It is important to seek medical care.  °CAUSES  °Syncope is caused by a sudden drop in blood flow to the brain. The specific cause is often not determined. Factors that can bring on syncope include: °· Taking medicines that lower blood pressure. °· Sudden changes in posture, such as standing up quickly. °· Taking more medicine than prescribed. °· Standing in one place for too long. °· Seizure disorders. °· Dehydration and excessive exposure to heat. °· Low blood sugar (hypoglycemia). °· Straining to have a bowel movement. °· Heart disease, irregular heartbeat, or other circulatory problems. °· Fear, emotional distress, seeing blood, or severe pain. °SYMPTOMS  °Right before fainting, you may: °· Feel dizzy or light-headed. °· Feel nauseous. °· See all white or all black in your field of vision. °· Have cold, clammy skin. °DIAGNOSIS  °Your health care provider will ask about your symptoms, perform a physical exam, and perform an electrocardiogram (ECG) to record the electrical activity of your heart. Your health care provider may also perform other heart or blood tests to determine the cause of your syncope which may include: °· Transthoracic echocardiogram (TTE). During echocardiography, sound waves are used to evaluate how blood flows through your heart. °· Transesophageal echocardiogram (TEE). °· Cardiac monitoring. This allows your health care provider to monitor your heart rate and rhythm in real time. °· Holter monitor. This is a portable device that records your  heartbeat and can help diagnose heart arrhythmias. It allows your health care provider to track your heart activity for several days, if needed. °· Stress tests by exercise or by giving medicine that makes the heart beat faster. °TREATMENT  °In most cases, no treatment is needed. Depending on the cause of your syncope, your health care provider may recommend changing or stopping some of your medicines. °HOME CARE INSTRUCTIONS °· Have someone stay with you until you feel stable. °· Do not drive, use machinery, or play sports until your health care provider says it is okay. °· Keep all follow-up appointments as directed by your health care provider. °· Lie down right away if you start feeling like you might faint. Breathe deeply and steadily. Wait until all the symptoms have passed. °· Drink enough fluids to keep your urine clear or pale yellow. °· If you are taking blood pressure or heart medicine, get up slowly and take several minutes to sit and then stand. This can reduce dizziness. °SEEK IMMEDIATE MEDICAL CARE IF:  °· You have a severe headache. °· You have unusual pain in the chest, abdomen, or back. °· You are bleeding from your mouth or rectum, or you have black or tarry stool. °· You have an irregular or very fast heartbeat. °· You have pain with breathing. °· You have repeated fainting or seizure-like jerking during an episode. °· You faint when sitting or lying down. °· You have confusion. °· You have trouble walking. °· You have severe weakness. °· You have vision problems. °If you fainted, call your local emergency services (911 in [place] Do not drive   yourself to the hospital.    This information is not intended to replace advice given to you by your health care provider. Make sure you discuss any questions you have with your health care provider.   Document Released: 06/12/2005 Document Revised: 10/27/2014 Document Reviewed: 08/11/2011 Elsevier Interactive Patient Education 2016 Elsevier  Inc.  Urinary Tract Infection Urinary tract infections (UTIs) can develop anywhere along your urinary tract. Your urinary tract is your body's drainage system for removing wastes and extra water. Your urinary tract includes two kidneys, two ureters, a bladder, and a urethra. Your kidneys are a pair of bean-shaped organs. Each kidney is about the size of your fist. They are located below your ribs, one on each side of your spine. CAUSES Infections are caused by microbes, which are microscopic organisms, including fungi, viruses, and bacteria. These organisms are so small that they can only be seen through a microscope. Bacteria are the microbes that most commonly cause UTIs. SYMPTOMS  Symptoms of UTIs may vary by age and gender of the patient and by the location of the infection. Symptoms in young women typically include a frequent and intense urge to urinate and a painful, burning feeling in the bladder or urethra during urination. Older women and men are more likely to be tired, shaky, and weak and have muscle aches and abdominal pain. A fever may mean the infection is in your kidneys. Other symptoms of a kidney infection include pain in your back or sides below the ribs, nausea, and vomiting. DIAGNOSIS To diagnose a UTI, your caregiver will ask you about your symptoms. Your caregiver will also ask you to provide a urine sample. The urine sample will be tested for bacteria and white blood cells. White blood cells are made by your body to help fight infection. TREATMENT  Typically, UTIs can be treated with medication. Because most UTIs are caused by a bacterial infection, they usually can be treated with the use of antibiotics. The choice of antibiotic and length of treatment depend on your symptoms and the type of bacteria causing your infection. HOME CARE INSTRUCTIONS  If you were prescribed antibiotics, take them exactly as your caregiver instructs you. Finish the medication even if you feel better  after you have only taken some of the medication.  Drink enough water and fluids to keep your urine clear or pale yellow.  Avoid caffeine, tea, and carbonated beverages. They tend to irritate your bladder.  Empty your bladder often. Avoid holding urine for long periods of time.  Empty your bladder before and after sexual intercourse.  After a bowel movement, women should cleanse from front to back. Use each tissue only once. SEEK MEDICAL CARE IF:   You have back pain.  You develop a fever.  Your symptoms do not begin to resolve within 3 days. SEEK IMMEDIATE MEDICAL CARE IF:   You have severe back pain or lower abdominal pain.  You develop chills.  You have nausea or vomiting.  You have continued burning or discomfort with urination. MAKE SURE YOU:   Understand these instructions.  Will watch your condition.  Will get help right away if you are not doing well or get worse.   This information is not intended to replace advice given to you by your health care provider. Make sure you discuss any questions you have with your health care provider.   Document Released: 03/22/2005 Document Revised: 03/03/2015 Document Reviewed: 07/21/2011 Elsevier Interactive Patient Education 2016 [place] Escherichia coli  Infection Enterohemorrhagic Escherichia coli (EHEC) infection is a common cause of bloody diarrhea. Strains of the bacterium Escherichia coli (E. coli), such as the strain type O157:H7, cause this disease. The bacterium produces a toxin that severely damages the cells lining the inside [place] of the intestines. This damage causes bloody diarrhea. Diarrhea typically starts 2 to 4 days after ingesting the EHEC bacteria and usually lasts for 1 to 8 days. Two serious complications may also follow EHEC infection in up to 10% of infected people. Hemolytic uremic syndrome (HUS) is a complication that is most likely to occur in young children. Thrombotic  thrombocytopenic purpura (TTP) is a rarer complication that follows EHEC infection in adults. Both of these complications can be life-threatening.  CAUSES  The most common source for EHEC bacteria is the intestinal tract of healthy cattle. Beef gets contaminated if the intestinal contents come in contact with the meat during slaughtering or processing. People then get the infection from eating undercooked beef, especially ground beef. People can also get infected from drinking unpasteurized milk or eating foods that were contaminated by feces from cattle and deer in the field, such as bean sprouts, green onions, lettuce, and apples used to make apple juice. People can also get this infection from contact with animals at petting [place] or from swimming in lakes contaminated with the bacteria. The infection can also be passed from person to person. SYMPTOMS   Abdominal cramps and tenderness.  Watery diarrhea, followed by bloody diarrhea.  Very mild fever. In some cases, there may be no fever at all. People who develop HUS may have other symptoms including:  Weakness.  Excessive tiredness.  Lightheadedness.  Seizures.  Nausea.  Vomiting. People who develop TTP may have:  Seizures.  Stroke.  Coma.  Problems with movement. DIAGNOSIS  Your caregiver may suspect you have EHEC infection based on your history and your symptoms. A stool sample will be taken and tested for toxin-producing E. coli. A colonoscopy may be done if your caregiver thinks other diseases may be causing your symptoms. This exam involves placing a thin, flexible tube into your rectum. The tube has a light source and a tiny video camera in it. Your caregiver uses the tube to look at the entire colon. TREATMENT  The most important part of treatment is drinking enough fluids. You may be given fluids through an intravenous (IV) tube if you cannot drink enough fluids on your own. Antibiotics are not given  because they do not shorten the length of the illness and they increase the risk of developing HUS. People who develop HUS or TTP usually need care in the hospital. Kidney dialysis treatment may also be needed. HOME CARE INSTRUCTIONS  Ask your caregiver about specific rehydration instructions.  Drink enough fluids to keep your urine clear or pale yellow.  Drink small amounts of clear liquids frequently. Clear liquids include water, broth, weak tea, and lemon-lime sodas.  Avoid:  Foods or drinks high in sugar.  Carbonated drinks.  Juice.  Extremely hot or cold fluids.  Drinks with caffeine.  Fatty, greasy foods.  Alcohol.  Tobacco.  Overeating.  Gelatin desserts.  You may consume probiotics. Probiotics are active cultures of beneficial bacteria. They may lessen the amount and number of diarrheal stools in adults. Probiotics can be found in yogurt with active cultures and in supplements.  Wash your hands well to avoid spreading the bacteria to others.  Only take over-the-counter or prescription medicines for pain, discomfort, or  fever as directed by your caregiver. Do not give aspirin to children.  If the patient is a child, weigh your child 2 to 3 times per day and record his or her weight. Ask your caregiver how much weight loss should concern you. SEEK IMMEDIATE MEDICAL CARE IF:   You are unable to keep fluids down.  Your vomiting or diarrhea becomes worse or persistent.  You have abdominal pain and it increases or stays in one area (localizes).  You develop a fever.  Your diarrhea contains more blood over time.  You feel very weak, dizzy, thirsty, or you faint. MAKE SURE YOU:  Understand these instructions.  Will watch your condition.  Will get help right away if you are not doing well or get worse.   This information is not intended to replace advice given to you by your health care provider. Make sure you discuss any questions you have with your health  care provider.   Document Released: 03/08/2005 Document Revised: 09/04/2011 Document Reviewed: 12/14/2014 Elsevier Interactive Patient Education 2016 Elsevier Inc. Ciprofloxacin tablets What is this medicine? CIPROFLOXACIN (sip roe FLOX a sin) is a quinolone antibiotic. It is used to treat certain kinds of bacterial infections. It will not work for colds, flu, or other viral infections. This medicine may be used for other purposes; ask your health care provider or pharmacist if you have questions. What should I tell my health care provider before I take this medicine? They need to know if you have any of these conditions: -bone problems -cerebral disease -history of low levels of potassium in the blood -joint problems -irregular heartbeat -kidney disease -myasthenia gravis -seizures -tendon problems -tingling of the fingers or toes, or other nerve disorder -an unusual or allergic reaction to ciprofloxacin, other antibiotics or medicines, foods, dyes, or preservatives -pregnant or trying to get pregnant -breast-feeding How should I use this medicine? Take this medicine by mouth with a glass of water. Follow the directions on the prescription label. Take your medicine at regular intervals. Do not take your medicine more often than directed. Take all of your medicine as directed even if you think your are better. Do not skip doses or stop your medicine early. You can take this medicine with food or on an empty stomach. It can be taken with a meal that contains dairy or calcium, but do not take it alone with a dairy product, like milk or yogurt or calcium-fortified juice. A special MedGuide will be given to you by the pharmacist with each prescription and refill. Be sure to read this information carefully each time. Talk to your pediatrician regarding the use of this medicine in children. Special care may be needed. Overdosage: If you think you have taken too much of this medicine contact a  poison control center or emergency room at once. NOTE: This medicine is only for you. Do not share this medicine with others. What if I miss a dose? If you miss a dose, take it as soon as you can. If it is almost time for your next dose, take only that dose. Do not take double or extra doses. What may interact with this medicine? Do not take this medicine with any of the following medications: -cisapride -droperidol -terfenadine -tizanidine This medicine may also interact with the following medications: -antacids -birth control pills -caffeine -cyclosporin -didanosine (ddI) buffered tablets or powder -medicines for diabetes -medicines for inflammation like ibuprofen, naproxen -methotrexate -multivitamins -omeprazole -phenytoin -probenecid -sucralfate -theophylline -warfarin This list may not describe  all possible interactions. Give your health care provider a list of all the medicines, herbs, non-prescription drugs, or dietary supplements you use. Also tell them if you smoke, drink alcohol, or use illegal drugs. Some items may interact with your medicine. What should I watch for while using this medicine? Tell your doctor or health care professional if your symptoms do not improve. Do not treat diarrhea with over the counter products. Contact your doctor if you have diarrhea that lasts more than 2 days or if it is severe and watery. You may get drowsy or dizzy. Do not drive, use machinery, or do anything that needs mental alertness until you know how this medicine affects you. Do not stand or sit up quickly, especially if you are an older patient. This reduces the risk of dizzy or fainting spells. This medicine can make you more sensitive to the sun. Keep out of the sun. If you cannot avoid being in the sun, wear protective clothing and use sunscreen. Do not use sun lamps or tanning beds/booths. Avoid antacids, aluminum, calcium, iron, magnesium, and zinc products for 6 hours before  and 2 hours after taking a dose of this medicine. What side effects may I notice from receiving this medicine? Side effects that you should report to your doctor or health care professional as soon as possible: -allergic reactions like skin rash or hives, swelling of the face, lips, or tongue -anxious -confusion -depressed mood -diarrhea -fast, irregular heartbeat -hallucination, loss of contact with reality -joint, muscle, or tendon pain or swelling -pain, tingling, numbness in the hands or feet -suicidal thoughts or other mood changes -sunburn -unusually weak or tired Side effects that usually do not require medical attention (report to your doctor or health care professional if they continue or are bothersome): -dry mouth -headache -nausea -trouble sleeping This list may not describe all possible side effects. Call your doctor for medical advice about side effects. You may report side effects to FDA at 1-800-FDA-1088. Where should I keep my medicine? Keep out of the reach of children. Store at room temperature below 30 degrees C (86 degrees F). Keep container tightly closed. Throw away any unused medicine after the expiration date. NOTE: This sheet is a summary. It may not cover all possible information. If you have questions about this medicine, talk to your doctor, pharmacist, or health care provider.    2016, Elsevier/[employer] Standard. (2015-01-21 12:57:02)

## 2015-08-13 DIAGNOSIS — N183 Chronic kidney disease, stage 3 (moderate): Secondary | ICD-10-CM

## 2015-08-13 DIAGNOSIS — E876 Hypokalemia: Secondary | ICD-10-CM | POA: Diagnosis not present

## 2015-08-13 DIAGNOSIS — N390 Urinary tract infection, site not specified: Secondary | ICD-10-CM

## 2015-08-13 LAB — CBC
HEMATOCRIT: 33.4 % — AB (ref 36.0–46.0)
HEMOGLOBIN: 10.7 g/dL — AB (ref 12.0–15.0)
MCH: 27 pg (ref 26.0–34.0)
MCHC: 32 g/dL (ref 30.0–36.0)
MCV: 84.1 fL (ref 78.0–100.0)
Platelets: 299 10*3/uL (ref 150–400)
RBC: 3.97 MIL/uL (ref 3.87–5.11)
RDW: 14.4 % (ref 11.5–15.5)
WBC: 8.7 10*3/uL (ref 4.0–10.5)

## 2015-08-13 LAB — GLUCOSE, CAPILLARY
GLUCOSE-CAPILLARY: 138 mg/dL — AB (ref 65–99)
Glucose-Capillary: 114 mg/dL — ABNORMAL HIGH (ref 65–99)
Glucose-Capillary: 104 mg/dL — ABNORMAL HIGH (ref 65–99)

## 2015-08-13 LAB — URINALYSIS, ROUTINE W REFLEX MICROSCOPIC
Bilirubin Urine: NEGATIVE
GLUCOSE, UA: NEGATIVE mg/dL
KETONES UR: NEGATIVE mg/dL
NITRITE: POSITIVE — AB
PH: 6 (ref 5.0–8.0)
Protein, ur: NEGATIVE mg/dL
SPECIFIC GRAVITY, URINE: 1.006 (ref 1.005–1.030)

## 2015-08-13 LAB — URINE MICROSCOPIC-ADD ON

## 2015-08-13 LAB — BASIC METABOLIC PANEL
ANION GAP: 9 (ref 5–15)
BUN: 7 mg/dL (ref 6–20)
CALCIUM: 8.9 mg/dL (ref 8.9–10.3)
CO2: 21 mmol/L — AB (ref 22–32)
Chloride: 113 mmol/L — ABNORMAL HIGH (ref 101–111)
Creatinine, Ser: 1.38 mg/dL — ABNORMAL HIGH (ref 0.44–1.00)
GFR calc non Af Amer: 41 mL/min — ABNORMAL LOW (ref >60)
GFR, EST AFRICAN AMERICAN: 48 mL/min — AB (ref >60)
GLUCOSE: 151 mg/dL — AB (ref 65–99)
POTASSIUM: 4.5 mmol/L (ref 3.5–5.1)
Sodium: 143 mmol/L (ref 135–145)

## 2015-08-13 LAB — LACTIC ACID, PLASMA
LACTIC ACID, VENOUS: 0.8 mmol/L (ref 0.5–2.0)
Lactic Acid, Venous: 0.8 mmol/L (ref 0.5–2.0)

## 2015-08-13 MED ORDER — DEXTROSE 5 % IV SOLN
1.0000 g | Freq: Every day | INTRAVENOUS | Status: DC
  Administered 2015-08-13 – 2015-08-15 (×3): 1 g via INTRAVENOUS
  Filled 2015-08-13 (×3): qty 10

## 2015-08-13 NOTE — Progress Notes (Signed)
Patient ID: Cloee Honegger, female   DOB: 03-Nov-1955, 60 y.o.   MRN: [id number] [facility] PROGRESS NOTE  Masey Kowalick [id number] DOB: 1956-04-06 DOA: 08/10/2015 PCP: Eustaquio Maize, MD  Brief narrative:    60 year old female with a history of vertigo who presented with loss of consciousness about few days prior to this admission. She also reported feeling lightheaded occasionally. On admission, she was hemodynamically stable. She was admitted for further evaluation of syncopal event. Troponin level was within normal limits. CT head and MRI did not show acute intracranial findings. She has been seen by neurology in consultation.  Assessment/Plan:    Active Problems:   Syncope and collapse - Unclear etiology - No seizure activity on EEG - MRI with no acute intracranial findings - Per neurology no further workup indicated - PT evaluation - Outpatient therapy     Fever / UTI - Overnight fever and UA positive for leukocytes - Started rocephin 2/17 - Awaiting blood and urine culture results     Hypokalemia - Normalized subsequently    Chronic renal failure, stage 3 - Baseline Cr 1.2 about 2 months ago - Cr 1.38 this am   DVT Prophylaxis  - Lovenox subQ in hospital   Code Status: Full.  Family Communication:  plan of care discussed with the patient Disposition Plan: Home 2/18   IV access:  Peripheral IV  Procedures and diagnostic studies:    Ct Head Wo Contrast 08/10/2015  No acute intracranial abnormalities. Electronically Signed   By: Lucienne Capers M.D.   On: 08/10/2015 22:07   Mr [name] Head Wo Contrast 08/11/2015  1. No acute intracranial abnormality, see # 3. Minimal for age nonspecific frontal lobe white matter signal changes, most commonly due to chronic small vessel disease. 2. Negative MRA Head and Neck aside from vessel tortuosity. 3. There might be some vascular tortuosity related in mass effect at the left seventh nerve root entry zone. If the facial twitching is  always unilaterally on the left then consider the possibility of a vascular compression related left hemifacial spasm. Electronically Signed   By: Genevie Ann M.D.   On: 08/11/2015 10:06   Mr Angiogram Neck W Wo Contrast 08/11/2015 1. No acute intracranial abnormality, see # 3. Minimal for age nonspecific frontal lobe white matter signal changes, most commonly due to chronic small vessel disease. 2. Negative MRA Head and Neck aside from vessel tortuosity. 3. There might be some vascular tortuosity related in mass effect at the left seventh nerve root entry zone. If the facial twitching is always unilaterally on the left then consider the possibility of a vascular compression related left hemifacial spasm. Electronically Signed   By: Genevie Ann M.D.   On: 08/11/2015 10:06   Mr [name] [id number] Contrast 08/11/2015  1. No acute intracranial abnormality, see # 3. Minimal for age nonspecific frontal lobe white matter signal changes, most commonly due to chronic small vessel disease. 2. Negative MRA Head and Neck aside from vessel tortuosity. 3. There might be some vascular tortuosity related in mass effect at the left seventh nerve root entry zone. If the facial twitching is always unilaterally on the left then consider the possibility of a vascular compression related left hemifacial spasm. Electronically Signed   By: Genevie Ann M.D.   On: 08/11/2015 10:06    Medical Consultants:  Neurology  Other Consultants:  PT  IAnti-Infectives:   Rocephin 08/13/2015 -->    Leisa Lenz, MD  [facility] Pager [phone]  Time spent in minutes: 15 minutes  If 7PM-7AM, please contact night-coverage [email or web address] Password [facility] 08/13/2015, 10:56 AM      HPI/Subjective: No acute overnight events. Fever overnight.   Objective: Filed Vitals:   08/12/15 1435 08/12/15 2100 08/12/15 2310 08/13/15 0604  BP: 132/68 115/52  112/56  Pulse: 77 86  82  Temp: 99.1 F (37.3 C) 102.5 F (39.2 C) 101.2 F (38.4 C) 98.9 F  (37.2 C)  TempSrc: Oral Oral Oral Oral  Resp: 16 18  18   Height:      Weight:      SpO2: 100% 100%  97%    Intake/Output Summary (Last 24 hours) at 08/13/15 1056 Last data filed at 08/13/15 0900  Gross per 24 hour  Intake   1740 ml  Output   4450 ml  Net  -2710 ml    Exam:   General:  Pt is alert, follows commands appropriately, not in acute distress  Cardiovascular: Regular rate and rhythm, S1/S2, no murmurs  Respiratory: Clear to auscultation bilaterally, no wheezing, no crackles, no rhonchi  Abdomen: Soft, non tender, non distended, bowel sounds present  Extremities: No edema, pulses DP and PT palpable bilaterally  Neuro: Grossly nonfocal  Data Reviewed: Basic Metabolic Panel:  Recent Labs Lab 08/10/15 2108 08/10/15 2119 08/11/15 1058 08/12/15 0507 08/13/15 0002  NA 139 140  --  146* 143  K 3.2* 3.4*  --  4.3 4.5  CL 102 102  --  116* 113*  CO2 24  --   --  21* 21*  GLUCOSE 132* 131*  --  160* 151*  BUN 14 16  --  11 7  CREATININE 1.52* 1.40*  --  1.18* 1.38*  CALCIUM 9.1  --   --  9.1 8.9  MG  --   --  2.0  --   --    Liver Function Tests:  Recent Labs Lab 08/10/15 2108 08/12/15 0507  AST 12* 9*  ALT 12* 9*  ALKPHOS 71 58  BILITOT 0.9 0.5  PROT 8.0 7.1  ALBUMIN 3.4* 2.8*   No results for input(s): LIPASE, AMYLASE in the last 168 hours. No results for input(s): AMMONIA in the last 168 hours. CBC:  Recent Labs Lab 08/10/15 2108 08/10/15 2119 08/12/15 0507 08/13/15 0002  WBC 9.2  --  7.3 8.7  NEUTROABS 5.6  --   --   --   HGB 10.9* 12.6 10.5* 10.7*  HCT 34.6* 37.0 33.5* 33.4*  MCV 82.0  --  83.5 84.1  PLT 321  --  324 299   Cardiac Enzymes:  Recent Labs Lab 08/11/15 1058 08/11/15 1615 08/11/15 2222  TROPONINI <0.03 <0.03 <0.03   BNP: Invalid input(s): POCBNP CBG:  Recent Labs Lab 08/11/15 1740 08/12/15 0749 08/12/15 1154 08/12/15 1703 08/13/15 0756  GLUCAP 89 159* 117* 146* 138*    No results found for this or  any previous visit (from the past 240 hour(s)).   Scheduled Meds: . antiseptic oral rinse  7 mL Mouth Rinse q12n4p  . cefTRIAXone (ROCEPHIN)  IV  1 g Intravenous Daily  . chlorhexidine  15 mL Mouth Rinse BID  . docusate sodium  100 mg Oral BID  . enoxaparin (LOVENOX) injection  40 mg Subcutaneous Q24H  . fluticasone  2 spray Each Nare Daily  . sodium chloride flush  3 mL Intravenous Q12H   Continuous Infusions: . 0.9 % NaCl with KCl 40 mEq / L 125 mL/hr (08/13/15 0153)

## 2015-08-13 NOTE — Care Management Note (Signed)
Case Management Note  Patient Details  Name: Heather Leach MRN: [id number] Date of Birth: 1956/05/15  Subjective/Objective:Provided w/otpt PT rehab resource list.Patient voiced understanding.                    Action/Plan:d/c plan home.   Expected Discharge Date:   (UNKNOWN)               Expected Discharge Plan:  Home/Self Care  In-House Referral:     Discharge planning Services  CM Consult  Post Acute Care Choice:    Choice offered to:     DME Arranged:    DME Agency:     HH Arranged:    HH Agency:     Status of Service:  In process, will continue to follow  Medicare Important Message Given:    Date Medicare IM Given:    Medicare IM give by:    Date Additional Medicare IM Given:    Additional Medicare Important Message give by:     If discussed at [place] of Stay Meetings, dates discussed:    Additional Comments:  Dessa Phi, RN 08/13/2015, 3:06 PM

## 2015-08-14 DIAGNOSIS — N390 Urinary tract infection, site not specified: Secondary | ICD-10-CM | POA: Diagnosis not present

## 2015-08-14 DIAGNOSIS — N183 Chronic kidney disease, stage 3 (moderate): Secondary | ICD-10-CM | POA: Diagnosis not present

## 2015-08-14 DIAGNOSIS — B9620 Unspecified Escherichia coli [E. coli] as the cause of diseases classified elsewhere: Secondary | ICD-10-CM

## 2015-08-14 DIAGNOSIS — E876 Hypokalemia: Secondary | ICD-10-CM | POA: Diagnosis not present

## 2015-08-14 LAB — GLUCOSE, CAPILLARY
GLUCOSE-CAPILLARY: 94 mg/dL (ref 65–99)
Glucose-Capillary: 120 mg/dL — ABNORMAL HIGH (ref 65–99)
Glucose-Capillary: 85 mg/dL (ref 65–99)

## 2015-08-14 NOTE — Progress Notes (Signed)
Patient ID: Heather Leach, female   DOB: 10-Feb-1956, 60 y.o.   MRN: [id number] [facility] PROGRESS NOTE  Heather Leach [id number] DOB: [birth year] DOA: 08/10/2015 PCP: Eustaquio Maize, MD  Brief narrative:    60 year old female with a history of vertigo who presented with loss of consciousness about few days prior to this admission. She also reported feeling lightheaded occasionally. On admission, she was hemodynamically stable. She was admitted for further evaluation of syncopal event. Troponin level was within normal limits. CT head and MRI did not show acute intracranial findings. She has been seen by neurology in consultation.  Assessment/Plan:    Active Problems:   Syncope and collapse - Unclear etiology - No seizure activity on EEG - MRI with no acute intracranial findings - Per neurology no further workup indicated - PT evaluation - Outpatient therapy     Fever / UTI due to E.Coli  - Urine culture positive for E.Coli - Blood cultures pending - Continue rocephin     Hypokalemia - Normalized subsequently    Chronic renal failure, stage 3 - Baseline Cr 1.2 about 2 months ago - Check BMP in am   DVT Prophylaxis  - Lovenox subQ   Code Status: Full.  Family Communication:  plan of care discussed with the patient Disposition Plan: Home 2/19 if blood cultures back    IV access:  Peripheral IV  Procedures and diagnostic studies:    Ct Head Wo Contrast 08/10/2015  No acute intracranial abnormalities. Electronically Signed   By: Lucienne Capers M.D.   On: 08/10/2015 22:07   Mr [name] Head Wo Contrast 08/11/2015  1. No acute intracranial abnormality, see # 3. Minimal for age nonspecific frontal lobe white matter signal changes, most commonly due to chronic small vessel disease. 2. Negative MRA Head and Neck aside from vessel tortuosity. 3. There might be some vascular tortuosity related in mass effect at the left seventh nerve root entry zone. If the facial twitching is always  unilaterally on the left then consider the possibility of a vascular compression related left hemifacial spasm. Electronically Signed   By: Genevie Ann M.D.   On: 08/11/2015 10:06   Mr Angiogram Neck W Wo Contrast 08/11/2015 1. No acute intracranial abnormality, see # 3. Minimal for age nonspecific frontal lobe white matter signal changes, most commonly due to chronic small vessel disease. 2. Negative MRA Head and Neck aside from vessel tortuosity. 3. There might be some vascular tortuosity related in mass effect at the left seventh nerve root entry zone. If the facial twitching is always unilaterally on the left then consider the possibility of a vascular compression related left hemifacial spasm. Electronically Signed   By: Genevie Ann M.D.   On: 08/11/2015 10:06   Mr [name] [id number] Contrast 08/11/2015  1. No acute intracranial abnormality, see # 3. Minimal for age nonspecific frontal lobe white matter signal changes, most commonly due to chronic small vessel disease. 2. Negative MRA Head and Neck aside from vessel tortuosity. 3. There might be some vascular tortuosity related in mass effect at the left seventh nerve root entry zone. If the facial twitching is always unilaterally on the left then consider the possibility of a vascular compression related left hemifacial spasm. Electronically Signed   By: Genevie Ann M.D.   On: 08/11/2015 10:06    Medical Consultants:  Neurology  Other Consultants:  PT  IAnti-Infectives:   Rocephin 08/13/2015 -->    Leisa Lenz, MD  [facility] Pager [phone]  Time spent in minutes: 15 minutes  If 7PM-7AM, please contact night-coverage [email or web address] Password [email or web address] 08/14/2015, 2:00 PM      HPI/Subjective: No acute overnight events. Feels better.   Objective: Filed Vitals:   08/13/15 1430 08/13/15 2157 08/14/15 0622 08/14/15 1327  BP: 106/61 115/54 105/53 138/70  Pulse: 73 78 71 69  Temp: 98.2 F (36.8 C) 99.5 F (37.5 C) 99.1 F (37.3 C) 97.4 F (36.3  C)  TempSrc: Oral Oral Oral Oral  Resp: 17 20 16 16   Height:      Weight:      SpO2: 100% 98% 97% 100%    Intake/Output Summary (Last 24 hours) at 08/14/15 1400 Last data filed at 08/14/15 1329  Gross per 24 hour  Intake   3120 ml  Output   7450 ml  Net  -4330 ml    Exam:   General:  Pt is alert, not in acute distress  Cardiovascular: Regular rate and rhythm, S1/S2 (+)  Respiratory: No wheezing, no crackles, no rhonchi  Abdomen: (+) BS, non tender  Extremities: No swelling, palpable pulses   Neuro: Nonfocal  Data Reviewed: Basic Metabolic Panel:  Recent Labs Lab 08/10/15 2108 08/10/15 2119 08/11/15 1058 08/12/15 0507 08/13/15 0002  NA 139 140  --  146* 143  K 3.2* 3.4*  --  4.3 4.5  CL 102 102  --  116* 113*  CO2 24  --   --  21* 21*  GLUCOSE 132* 131*  --  160* 151*  BUN 14 16  --  11 7  CREATININE 1.52* 1.40*  --  1.18* 1.38*  CALCIUM 9.1  --   --  9.1 8.9  MG  --   --  2.0  --   --    Liver Function Tests:  Recent Labs Lab 08/10/15 2108 08/12/15 0507  AST 12* 9*  ALT 12* 9*  ALKPHOS 71 58  BILITOT 0.9 0.5  PROT 8.0 7.1  ALBUMIN 3.4* 2.8*   No results for input(s): LIPASE, AMYLASE in the last 168 hours. No results for input(s): AMMONIA in the last 168 hours. CBC:  Recent Labs Lab 08/10/15 2108 08/10/15 2119 08/12/15 0507 08/13/15 0002  WBC 9.2  --  7.3 8.7  NEUTROABS 5.6  --   --   --   HGB 10.9* 12.6 10.5* 10.7*  HCT 34.6* 37.0 33.5* 33.4*  MCV 82.0  --  83.5 84.1  PLT 321  --  324 299   Cardiac Enzymes:  Recent Labs Lab 08/11/15 1058 08/11/15 1615 08/11/15 2222  TROPONINI <0.03 <0.03 <0.03   BNP: Invalid input(s): POCBNP CBG:  Recent Labs Lab 08/13/15 0756 08/13/15 1151 08/13/15 1653 08/14/15 0739 08/14/15 1214  GLUCAP 138* 114* 104* 120* 85    Recent Results (from the past 240 hour(s))  Culture, Urine     Status: None (Preliminary result)   Collection Time: 08/13/15  1:53 AM  Result Value Ref Range Status    Specimen Description URINE, RANDOM  Final   Special Requests NONE  Final   Culture   Final    >=100,000 COLONIES/mL ESCHERICHIA COLI Performed at [facility]    Report Status PENDING  Incomplete     Scheduled Meds: . antiseptic oral rinse  7 mL Mouth Rinse q12n4p  . cefTRIAXone (ROCEPHIN)  IV  1 g Intravenous Daily  . chlorhexidine  15 mL Mouth Rinse BID  . docusate sodium  100 mg Oral BID  . enoxaparin (LOVENOX) injection  40  mg Subcutaneous Q24H  . fluticasone  2 spray Each Nare Daily  . sodium chloride flush  3 mL Intravenous Q12H   Continuous Infusions: . 0.9 % NaCl with KCl 40 mEq / L 125 mL/hr (08/14/15 0947)

## 2015-08-15 DIAGNOSIS — R509 Fever, unspecified: Secondary | ICD-10-CM

## 2015-08-15 DIAGNOSIS — E876 Hypokalemia: Secondary | ICD-10-CM | POA: Diagnosis not present

## 2015-08-15 DIAGNOSIS — B9620 Unspecified Escherichia coli [E. coli] as the cause of diseases classified elsewhere: Secondary | ICD-10-CM | POA: Diagnosis not present

## 2015-08-15 DIAGNOSIS — N390 Urinary tract infection, site not specified: Secondary | ICD-10-CM | POA: Diagnosis not present

## 2015-08-15 LAB — URINE CULTURE: Culture: >=100000

## 2015-08-15 LAB — GLUCOSE, CAPILLARY
GLUCOSE-CAPILLARY: 128 mg/dL — AB (ref 65–99)
Glucose-Capillary: 94 mg/dL (ref 65–99)

## 2015-08-15 MED ORDER — CIPROFLOXACIN HCL 500 MG PO TABS: 500.0000 mg | ORAL_TABLET | Freq: Two times a day (BID) | ORAL | Status: DC

## 2015-08-15 NOTE — Discharge Summary (Signed)
Physician Discharge Summary  Heather Leach [id number] DOB: [birth year] DOA: 08/10/2015  PCP: Eustaquio Maize, MD  Admit date: 08/10/2015 Discharge date: 08/15/2015  Recommendations for Outpatient Follow-up:  Take cipro for 4 days on discharge for UTI  Discharge Diagnoses:  Active Problems:   Syncope and collapse   Hypokalemia   Orthostatic hypotension   Vertigo    Discharge Condition: stable   Diet recommendation: as tolerated   History of present illness:  60 year old female with a history of vertigo who presented with loss of consciousness about few days prior to this admission. She also reported feeling lightheaded occasionally. On admission, she was hemodynamically stable. She was admitted for further evaluation of syncopal event. Troponin level was within normal limits. CT head and MRI did not show acute intracranial findings. She has been seen by neurology in consultation.  Hospital Course:   Assessment/Plan:    Active Problems:  Syncope and collapse - Unclear etiology - No seizure activity on EEG - MRI with no acute intracranial findings - Per neurology no further workup indicated - PT evaluation - Outpatient therapy    Fever / UTI due to E.Coli  - Urine culture positive for E.Coli - Blood cultures negative - Received rocephin in hospital - Cipro for 4 days on discharge    Hypokalemia - Normalized subsequently   Chronic renal failure, stage 3 - Baseline Cr 1.2 about 2 months ago - Cr 1.3 on 2/17 - She have follow up with PCP and recheck Cr then    DVT Prophylaxis  - Lovenox subQ in hospital   Code Status: Full.  Family Communication: plan of care discussed with the patient   IV access:  Peripheral IV  Procedures and diagnostic studies:   Ct Head Wo Contrast 08/10/2015 No acute intracranial abnormalities. Electronically Signed By: Lucienne Capers M.D. On: 08/10/2015 22:07   Mr [name] Head Wo Contrast 08/11/2015 1. No acute  intracranial abnormality, see # 3. Minimal for age nonspecific frontal lobe white matter signal changes, most commonly due to chronic small vessel disease. 2. Negative MRA Head and Neck aside from vessel tortuosity. 3. There might be some vascular tortuosity related in mass effect at the left seventh nerve root entry zone. If the facial twitching is always unilaterally on the left then consider the possibility of a vascular compression related left hemifacial spasm. Electronically Signed By: Genevie Ann M.D. On: 08/11/2015 10:06   Mr Angiogram Neck W Wo Contrast 08/11/2015 1. No acute intracranial abnormality, see # 3. Minimal for age nonspecific frontal lobe white matter signal changes, most commonly due to chronic small vessel disease. 2. Negative MRA Head and Neck aside from vessel tortuosity. 3. There might be some vascular tortuosity related in mass effect at the left seventh nerve root entry zone. If the facial twitching is always unilaterally on the left then consider the possibility of a vascular compression related left hemifacial spasm. Electronically Signed By: Genevie Ann M.D. On: 08/11/2015 10:06   Mr [name] [id number] Contrast 08/11/2015 1. No acute intracranial abnormality, see # 3. Minimal for age nonspecific frontal lobe white matter signal changes, most commonly due to chronic small vessel disease. 2. Negative MRA Head and Neck aside from vessel tortuosity. 3. There might be some vascular tortuosity related in mass effect at the left seventh nerve root entry zone. If the facial twitching is always unilaterally on the left then consider the possibility of a vascular compression related left hemifacial spasm. Electronically Signed By: Genevie Ann M.D. On: 08/11/2015 10:06  Medical Consultants:  Neurology  Other Consultants:  PT  IAnti-Infectives:   Rocephin 08/13/2015 --> 08/15/2015  Signed:  Leisa Lenz, MD  [facility] 08/15/2015, 10:54 AM  Pager #:  [phone]  Time spent in minutes: more than 30 minutes   Discharge Exam: Filed Vitals:   08/14/15 2033 08/15/15 0556  BP: 111/56 118/75  Pulse: 72 79  Temp: 98.5 F (36.9 C) 98.1 F (36.7 C)  Resp: 16 18   Filed Vitals:   08/14/15 1327 08/14/15 1447 08/14/15 2033 08/15/15 0556  BP: 138/70 115/65 111/56 118/75  Pulse: 69  72 79  Temp: 97.4 F (36.3 C)  98.5 F (36.9 C) 98.1 F (36.7 C)  TempSrc: Oral  Oral Oral  Resp: 16  16 18   Height:      Weight:      SpO2: 100%  100% 98%    General: Pt is alert, follows commands appropriately, not in acute distress Cardiovascular: Regular rate and rhythm, S1/S2 +, no murmurs Respiratory: Clear to auscultation bilaterally, no wheezing, no crackles, no rhonchi Abdominal: Soft, non tender, non distended, bowel sounds +, no guarding Extremities: no edema, no cyanosis, pulses palpable bilaterally DP and PT Neuro: Grossly nonfocal  Discharge Instructions  Discharge Instructions    Call MD for:  difficulty breathing, headache or visual disturbances    Complete by:  As directed      Call MD for:  difficulty breathing, headache or visual disturbances    Complete by:  As directed      Call MD for:  persistant dizziness or light-headedness    Complete by:  As directed      Call MD for:  persistant dizziness or light-headedness    Complete by:  As directed      Call MD for:  persistant nausea and vomiting    Complete by:  As directed      Call MD for:  persistant nausea and vomiting    Complete by:  As directed      Call MD for:  severe uncontrolled pain    Complete by:  As directed      Call MD for:  severe uncontrolled pain    Complete by:  As directed      Diet - low sodium heart healthy    Complete by:  As directed      Diet - low sodium heart healthy    Complete by:  As directed      Discharge instructions    Complete by:  As directed   Take cipro 500 mg twice a day for 4 days on discharge for E.Coli UTI.     Increase  activity slowly    Complete by:  As directed      Increase activity slowly    Complete by:  As directed             Medication List    STOP taking these medications        fluticasone 50 MCG/ACT nasal spray  Commonly known as:  FLONASE     oseltamivir 75 MG capsule  Commonly known as:  TAMIFLU      TAKE these medications        ciprofloxacin 500 MG tablet  Commonly known as:  CIPRO  Take 1 tablet (500 mg total) by mouth 2 (two) times daily.     meclizine 25 MG tablet  Commonly known as:  ANTIVERT  Take 25 mg by mouth 3 (three) times daily as needed  for dizziness.           Follow-up Information    Follow up with Eustaquio Maize, MD. Schedule an appointment as soon as possible for a visit in 1 week.   Specialty:  Pediatrics   Why:  Follow up appt after recent hospitalization   Contact information:   [place] [address] [phone]       Follow up with Leisa Lenz, MD.   Specialty:  Internal Medicine   Why:  [phone]; call me if any questions about coumadin or augmentin    Contact information:   [place] [address] [phone]       Follow up with Eustaquio Maize, MD. Schedule an appointment as soon as possible for a visit in 2 weeks.   Specialty:  Pediatrics   Why:  Follow up appt after recent hospitalization   Contact information:   [employer] [place] [address] [phone]        The results of significant diagnostics from this hospitalization (including imaging, microbiology, ancillary and laboratory) are listed below for reference.    Significant Diagnostic Studies: Ct Head Wo Contrast  08/10/2015  CLINICAL DATA:  Headache and vertigo for 2 weeks, worse since Sunday. Facial twitching. Patient has been seen by primary care physician for vertigo. EXAM: CT HEAD WITHOUT CONTRAST TECHNIQUE: Contiguous axial images were obtained from the base of the skull through the vertex without intravenous contrast.  COMPARISON:  07/10/2015 FINDINGS: Ventricles and sulci appear symmetrical. No ventricular dilatation. No mass effect or midline shift. No abnormal extra-axial fluid collections. Gray-white matter junctions are distinct. Basal cisterns are not effaced. No evidence of acute intracranial hemorrhage. Calcifications along the falx and tentorium, likely dystrophic. No depressed skull fractures. Visualized paranasal sinuses and mastoid air cells are not opacified. IMPRESSION: No acute intracranial abnormalities. Electronically Signed   By: Lucienne Capers M.D.   On: 08/10/2015 22:07   Mr Virgel Paling Wo Contrast  08/11/2015  CLINICAL DATA:  60 year old female with 2 weeks of headache and vertigo, exacerbation for 3 days with new left facial twitching. Initial encounter. EXAM: MRI HEAD WITHOUT AND WITH CONTRAST MRA HEAD WITHOUT CONTRAST MRA NECK WITHOUT AND WITH CONTRAST TECHNIQUE: Multiplanar, multiecho pulse sequences of the brain and surrounding structures were obtained without and with intravenous contrast. Angiographic images of the Circle of Willis were obtained using MRA technique without intravenous contrast. Angiographic images of the neck were obtained using MRA technique without and with intravenous contrast. Carotid stenosis measurements (when applicable) are obtained utilizing NASCET criteria, using the distal internal carotid diameter as the denominator. CONTRAST:  1mL MULTIHANCE GADOBENATE DIMEGLUMINE 529 MG/ML IV SOLN COMPARISON:  Head CT without contrast 08/10/2015 and earlier. FINDINGS: MRI HEAD FINDINGS Mild scaphocephaly. Cerebral volume is within normal limits. No restricted diffusion to suggest acute infarction. No midline shift, mass effect, evidence of mass lesion, ventriculomegaly, extra-axial collection or acute intracranial hemorrhage. Cervicomedullary junction and pituitary are within normal limits. Negative visualized cervical spine. Major intracranial vascular flow voids are within normal  limits. Minimal to mild for age mostly superior frontal gyrus subcortical white matter T2 and FLAIR hyperintensity. Negative gray and white matter signal elsewhere. No cortical encephalomalacia or chronic cerebral blood products. No abnormal enhancement identified. Visible internal auditory structures appear normal. However, there might be in some mass effect related to distal vertebral artery tortuosity at the left cerebellopontine angle near the seventh nerve root entry zone, uncertain (series 4,  image 13). Mastoids are clear. Paranasal sinuses are clear. Negative orbit and scalp soft tissues. Visualized bone marrow signal is within normal limits. MRA NECK FINDINGS Precontrast time-of-flight imaging reveals antegrade flow in both carotid and vertebral arteries throughout the neck. Post-contrast MRA images reveal a 3 vessel arch configuration with normal great vessel origins. Normal right CCA and right carotid bifurcation. Moderately tortuous but otherwise normal cervical right ICA. Mildly to moderately tortuous left CCA. Negative left carotid bifurcation. Mildly tortuous cervical left ICA. No proximal subclavian artery stenosis. Both vertebral artery origins are normal. Mildly tortuous V1 segments. Codominant vertebral arteries. V2, V3, and distal vertebral arteries appear within normal limits. MRA HEAD FINDINGS Antegrade flow in the posterior circulation with codominant distal vertebral arteries which appear normal. Normal PICA origins. Tortuous but otherwise normal vertebrobasilar junction and basilar artery. Posterior communicating arteries are diminutive or absent. Normal SCA and PCA origins. Bilateral PCA branches are within normal limits. Antegrade flow in both ICA siphons. No siphon stenosis. Normal left ophthalmic artery origin. Normal carotid termini. Normal MCA and ACA origins. The anterior communicating artery might be fenestrated (series 6, image 102). Visualized bilateral ACA branches are within  normal limits. MCA M1 segments, bifurcations, and visualized bilateral MCA branches are within normal limits. IMPRESSION: 1. No acute intracranial abnormality, see # 3. Minimal for age nonspecific frontal lobe white matter signal changes, most commonly due to chronic small vessel disease. 2. Negative MRA Head and Neck aside from vessel tortuosity. 3. There might be some vascular tortuosity related in mass effect at the left seventh nerve root entry zone. If the facial twitching is always unilaterally on the left then consider the possibility of a vascular compression related left hemifacial spasm. Electronically Signed   By: Genevie Ann M.D.   On: 08/11/2015 10:06   Mr Angiogram Neck W Wo Contrast  08/11/2015  CLINICAL DATA:  60 year old female with 2 weeks of headache and vertigo, exacerbation for 3 days with new left facial twitching. Initial encounter. EXAM: MRI HEAD WITHOUT AND WITH CONTRAST MRA HEAD WITHOUT CONTRAST MRA NECK WITHOUT AND WITH CONTRAST TECHNIQUE: Multiplanar, multiecho pulse sequences of the brain and surrounding structures were obtained without and with intravenous contrast. Angiographic images of the Circle of Willis were obtained using MRA technique without intravenous contrast. Angiographic images of the neck were obtained using MRA technique without and with intravenous contrast. Carotid stenosis measurements (when applicable) are obtained utilizing NASCET criteria, using the distal internal carotid diameter as the denominator. CONTRAST:  5mL MULTIHANCE GADOBENATE DIMEGLUMINE 529 MG/ML IV SOLN COMPARISON:  Head CT without contrast 08/10/2015 and earlier. FINDINGS: MRI HEAD FINDINGS Mild scaphocephaly. Cerebral volume is within normal limits. No restricted diffusion to suggest acute infarction. No midline shift, mass effect, evidence of mass lesion, ventriculomegaly, extra-axial collection or acute intracranial hemorrhage. Cervicomedullary junction and pituitary are within normal limits.  Negative visualized cervical spine. Major intracranial vascular flow voids are within normal limits. Minimal to mild for age mostly superior frontal gyrus subcortical white matter T2 and FLAIR hyperintensity. Negative gray and white matter signal elsewhere. No cortical encephalomalacia or chronic cerebral blood products. No abnormal enhancement identified. Visible internal auditory structures appear normal. However, there might be in some mass effect related to distal vertebral artery tortuosity at the left cerebellopontine angle near the seventh nerve root entry zone, uncertain (series 4, image 13). Mastoids are clear. Paranasal sinuses are clear. Negative orbit and scalp soft tissues. Visualized bone marrow signal is within normal limits. MRA NECK FINDINGS Precontrast time-of-flight  imaging reveals antegrade flow in both carotid and vertebral arteries throughout the neck. Post-contrast MRA images reveal a 3 vessel arch configuration with normal great vessel origins. Normal right CCA and right carotid bifurcation. Moderately tortuous but otherwise normal cervical right ICA. Mildly to moderately tortuous left CCA. Negative left carotid bifurcation. Mildly tortuous cervical left ICA. No proximal subclavian artery stenosis. Both vertebral artery origins are normal. Mildly tortuous V1 segments. Codominant vertebral arteries. V2, V3, and distal vertebral arteries appear within normal limits. MRA HEAD FINDINGS Antegrade flow in the posterior circulation with codominant distal vertebral arteries which appear normal. Normal PICA origins. Tortuous but otherwise normal vertebrobasilar junction and basilar artery. Posterior communicating arteries are diminutive or absent. Normal SCA and PCA origins. Bilateral PCA branches are within normal limits. Antegrade flow in both ICA siphons. No siphon stenosis. Normal left ophthalmic artery origin. Normal carotid termini. Normal MCA and ACA origins. The anterior communicating artery  might be fenestrated (series 6, image 102). Visualized bilateral ACA branches are within normal limits. MCA M1 segments, bifurcations, and visualized bilateral MCA branches are within normal limits. IMPRESSION: 1. No acute intracranial abnormality, see # 3. Minimal for age nonspecific frontal lobe white matter signal changes, most commonly due to chronic small vessel disease. 2. Negative MRA Head and Neck aside from vessel tortuosity. 3. There might be some vascular tortuosity related in mass effect at the left seventh nerve root entry zone. If the facial twitching is always unilaterally on the left then consider the possibility of a vascular compression related left hemifacial spasm. Electronically Signed   By: Genevie Ann M.D.   On: 08/11/2015 10:06   Mr [name] [id number] Contrast  08/11/2015  CLINICAL DATA:  60 year old female with 2 weeks of headache and vertigo, exacerbation for 3 days with new left facial twitching. Initial encounter. EXAM: MRI HEAD WITHOUT AND WITH CONTRAST MRA HEAD WITHOUT CONTRAST MRA NECK WITHOUT AND WITH CONTRAST TECHNIQUE: Multiplanar, multiecho pulse sequences of the brain and surrounding structures were obtained without and with intravenous contrast. Angiographic images of the Circle of Willis were obtained using MRA technique without intravenous contrast. Angiographic images of the neck were obtained using MRA technique without and with intravenous contrast. Carotid stenosis measurements (when applicable) are obtained utilizing NASCET criteria, using the distal internal carotid diameter as the denominator. CONTRAST:  55mL MULTIHANCE GADOBENATE DIMEGLUMINE 529 MG/ML IV SOLN COMPARISON:  Head CT without contrast 08/10/2015 and earlier. FINDINGS: MRI HEAD FINDINGS Mild scaphocephaly. Cerebral volume is within normal limits. No restricted diffusion to suggest acute infarction. No midline shift, mass effect, evidence of mass lesion, ventriculomegaly, extra-axial collection or acute intracranial  hemorrhage. Cervicomedullary junction and pituitary are within normal limits. Negative visualized cervical spine. Major intracranial vascular flow voids are within normal limits. Minimal to mild for age mostly superior frontal gyrus subcortical white matter T2 and FLAIR hyperintensity. Negative gray and white matter signal elsewhere. No cortical encephalomalacia or chronic cerebral blood products. No abnormal enhancement identified. Visible internal auditory structures appear normal. However, there might be in some mass effect related to distal vertebral artery tortuosity at the left cerebellopontine angle near the seventh nerve root entry zone, uncertain (series 4, image 13). Mastoids are clear. Paranasal sinuses are clear. Negative orbit and scalp soft tissues. Visualized bone marrow signal is within normal limits. MRA NECK FINDINGS Precontrast time-of-flight imaging reveals antegrade flow in both carotid and vertebral arteries throughout the neck. Post-contrast MRA images reveal a 3 vessel arch configuration with normal great vessel origins. Normal right  CCA and right carotid bifurcation. Moderately tortuous but otherwise normal cervical right ICA. Mildly to moderately tortuous left CCA. Negative left carotid bifurcation. Mildly tortuous cervical left ICA. No proximal subclavian artery stenosis. Both vertebral artery origins are normal. Mildly tortuous V1 segments. Codominant vertebral arteries. V2, V3, and distal vertebral arteries appear within normal limits. MRA HEAD FINDINGS Antegrade flow in the posterior circulation with codominant distal vertebral arteries which appear normal. Normal PICA origins. Tortuous but otherwise normal vertebrobasilar junction and basilar artery. Posterior communicating arteries are diminutive or absent. Normal SCA and PCA origins. Bilateral PCA branches are within normal limits. Antegrade flow in both ICA siphons. No siphon stenosis. Normal left ophthalmic artery origin. Normal  carotid termini. Normal MCA and ACA origins. The anterior communicating artery might be fenestrated (series 6, image 102). Visualized bilateral ACA branches are within normal limits. MCA M1 segments, bifurcations, and visualized bilateral MCA branches are within normal limits. IMPRESSION: 1. No acute intracranial abnormality, see # 3. Minimal for age nonspecific frontal lobe white matter signal changes, most commonly due to chronic small vessel disease. 2. Negative MRA Head and Neck aside from vessel tortuosity. 3. There might be some vascular tortuosity related in mass effect at the left seventh nerve root entry zone. If the facial twitching is always unilaterally on the left then consider the possibility of a vascular compression related left hemifacial spasm. Electronically Signed   By: Genevie Ann M.D.   On: 08/11/2015 10:06   Dg Chest Port 1 View  08/12/2015  CLINICAL DATA:  Sudden onset fever. EXAM: PORTABLE CHEST 1 VIEW COMPARISON:  None. FINDINGS: Shallow inspiration. Probable mild atelectasis in the lung bases. Calcified aorta. Normal heart size and pulmonary vascularity. No focal consolidation in the lungs. No blunting of costophrenic angles. No pneumothorax. IMPRESSION: Shallow inspiration with mild atelectasis in the lung bases. Electronically Signed   By: Lucienne Capers M.D.   On: 08/12/2015 23:57    Microbiology: Recent Results (from the past 240 hour(s))  Culture, blood (routine x 2)     Status: None (Preliminary result)   Collection Time: 08/13/15 12:02 AM  Result Value Ref Range Status   Specimen Description BLOOD LEFT ANTECUBITAL  Final   Special Requests BOTTLES DRAWN AEROBIC ONLY 5CC  Final   Culture   Final    NO GROWTH 1 DAY Performed at [facility]    Report Status PENDING  Incomplete  Culture, blood (routine x 2)     Status: None (Preliminary result)   Collection Time: 08/13/15 12:02 AM  Result Value Ref Range Status   Specimen Description BLOOD LEFT HAND  Final    Special Requests BOTTLES DRAWN AEROBIC ONLY 5CC  Final   Culture   Final    NO GROWTH 1 DAY Performed at [facility]    Report Status PENDING  Incomplete  Culture, Urine     Status: None (Preliminary result)   Collection Time: 08/13/15  1:53 AM  Result Value Ref Range Status   Specimen Description URINE, RANDOM  Final   Special Requests NONE  Final   Culture   Final    >=100,000 COLONIES/mL ESCHERICHIA COLI Performed at [facility]    Report Status PENDING  Incomplete     Labs: Basic Metabolic Panel:  Recent Labs Lab 08/10/15 2108 08/10/15 2119 08/11/15 1058 08/12/15 0507 08/13/15 0002  NA 139 140  --  146* 143  K 3.2* 3.4*  --  4.3 4.5  CL 102 102  --  116* 113*  CO2 24  --   --  21* 21*  GLUCOSE 132* 131*  --  160* 151*  BUN 14 16  --  11 7  CREATININE 1.52* 1.40*  --  1.18* 1.38*  CALCIUM 9.1  --   --  9.1 8.9  MG  --   --  2.0  --   --    Liver Function Tests:  Recent Labs Lab 08/10/15 2108 08/12/15 0507  AST 12* 9*  ALT 12* 9*  ALKPHOS 71 58  BILITOT 0.9 0.5  PROT 8.0 7.1  ALBUMIN 3.4* 2.8*   No results for input(s): LIPASE, AMYLASE in the last 168 hours. No results for input(s): AMMONIA in the last 168 hours. CBC:  Recent Labs Lab 08/10/15 2108 08/10/15 2119 08/12/15 0507 08/13/15 0002  WBC 9.2  --  7.3 8.7  NEUTROABS 5.6  --   --   --   HGB 10.9* 12.6 10.5* 10.7*  HCT 34.6* 37.0 33.5* 33.4*  MCV 82.0  --  83.5 84.1  PLT 321  --  324 299   Cardiac Enzymes:  Recent Labs Lab 08/11/15 1058 08/11/15 1615 08/11/15 2222  TROPONINI <0.03 <0.03 <0.03   BNP: BNP (last 3 results) No results for input(s): BNP in the last 8760 hours.  ProBNP (last 3 results) No results for input(s): PROBNP in the last 8760 hours.  CBG:  Recent Labs Lab 08/13/15 1653 08/14/15 0739 08/14/15 1214 08/14/15 1636 08/15/15 0746  GLUCAP 104* 120* 85 94 128*

## 2015-08-18 LAB — CULTURE, BLOOD (ROUTINE X 2)
Culture: NO GROWTH
Culture: NO GROWTH

## 2015-08-19 ENCOUNTER — Ambulatory Visit (INDEPENDENT_AMBULATORY_CARE_PROVIDER_SITE_OTHER): Payer: BLUE CROSS/BLUE SHIELD | Admitting: Pediatrics

## 2015-08-19 ENCOUNTER — Encounter: Payer: Self-pay | Admitting: Pediatrics

## 2015-08-19 VITALS — BP 112/72 | HR 75 | Temp 97.6°F | Ht 65.0 in | Wt 181.0 lb

## 2015-08-19 DIAGNOSIS — I10 Essential (primary) hypertension: Secondary | ICD-10-CM | POA: Diagnosis not present

## 2015-08-19 DIAGNOSIS — R42 Dizziness and giddiness: Secondary | ICD-10-CM | POA: Diagnosis not present

## 2015-08-19 DIAGNOSIS — N814 Uterovaginal prolapse, unspecified: Secondary | ICD-10-CM | POA: Diagnosis not present

## 2015-08-19 DIAGNOSIS — N10 Acute tubulo-interstitial nephritis: Secondary | ICD-10-CM | POA: Diagnosis not present

## 2015-08-19 MED ORDER — CIPROFLOXACIN HCL 500 MG PO TABS: 500.0000 mg | ORAL_TABLET | Freq: Two times a day (BID) | ORAL | Status: DC

## 2015-08-19 NOTE — Progress Notes (Signed)
Subjective:    Patient ID: Heather Leach, female    DOB: 1955-09-07, 60 y.o.   MRN: [id number]  CC: Hospitalization Follow-up   HPI: Heather Leach is a 60 y.o. female presenting for Hospitalization Follow-up  Admitted to [facility] for several days Admitted after episode of syncope at home Then had twitching and tingling L side of face while there, lasted for a couple of hours Was seen in clinic 2 weeks prior to admission for vertigo, now ongoing several weeks Still has vertigo, doesn't feel steady on her feet still. Wonders if some of it can be from the UTI.  Had MRI, CT scan, neurology work up while inpt. No cause of face twitching, syncope, or vertigo found Had pyelonephritis while in hospital, temp up to 103 Still with some back pain she attributes to the infection No worsening of vertigo, no return of L facial symptoms  Has had prolapsed uterus for several years, pushes uterus back in at times but is usually prolapsed. Would like to have it fixed.  Relevant past medical, surgical, family and social history reviewed and updated as indicated. Interim medical history since our last visit reviewed. Allergies and medications reviewed and updated.    ROS: All systems neg other than what is in HPI  History  Smoking status  . Never Smoker   Smokeless tobacco  . Never Used    Past Medical History Patient Active Problem List   Diagnosis Date Noted  . Essential hypertension 08/19/2015  . Uterine prolapse 08/19/2015  . Syncope and collapse 08/11/2015  . Hypokalemia 08/11/2015  . Orthostatic hypotension 08/11/2015  . Vertigo 08/11/2015  . BMI 31.0-31.9,adult 06/12/2015  . Elevated blood pressure 06/12/2015  . Tinea corporis 06/12/2015  . Pre-diabetes 06/12/2015  . 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 Urinary frequency 10/28/2014  . Urinary incontinence 10/28/2014  . Vaginal itching 10/28/2014  . Hyperlipidemia 05/18/2010  . OVERWEIGHT 05/18/2010  . PRECORDIAL PAIN  05/18/2010  . ABNORMAL STRESS ELECTROCARDIOGRAM 05/18/2010    Current Outpatient Prescriptions  Medication Sig Dispense Refill  . ciprofloxacin (CIPRO) 500 MG tablet Take 1 tablet (500 mg total) by mouth 2 (two) times daily. 6 tablet 0  . meclizine (ANTIVERT) 25 MG tablet Take 25 mg by mouth 3 (three) times daily as needed for dizziness.     No current facility-administered medications for this visit.       Objective:    BP 112/72 mmHg  Pulse 75  Temp(Src) 97.6 F (36.4 C) (Oral)  Ht _0  (1.651 m)  Wt 181 lb (82.101 kg)  BMI 30.12 kg/m2  Wt Readings from Last 3 Encounters:  08/19/15 181 lb (82.101 kg)  08/11/15 182 lb (82.555 kg)  07/22/15 187 lb 3.2 oz (84.913 kg)     Gen: NAD, alert, cooperative with exam, NCAT EYES: EOMI, no scleral injection or icterus ENT:  TMs pearly gray b/l, OP without erythema LYMPH: no cervical LAD CV: NRRR, normal S1/S2, no murmur, distal pulses 2+ b/l Resp: CTABL, no wheezes, normal WOB Abd: +BS, soft, NTND. no guarding or organomegaly Ext: No edema, warm Neuro: Alert and oriented, strength equal b/l UE and LE, coordination grossly normal MSK: normal muscle bulk     Assessment & Plan:    Ginnette was seen today for hospitalization follow-up, symptoms improving though still with vertigo. Normal head imaging. Will refer for vestibular rehab.  Diagnoses and all orders for this visit:  Vertigo -     Ambulatory referral to Physical Therapy  Acute pyelonephritis -     CBC -     ciprofloxacin (CIPRO) 500 MG tablet; Take 1 tablet (500 mg total) by mouth 2 (two) times daily.  Essential hypertension -     BMP8+EGFR  Uterine prolapse -     Ambulatory referral to Gynecology    Follow up plan: No Follow-up on file.  Assunta Found, MD [facility] 08/19/2015, 2:52 PM

## 2015-08-20 LAB — CBC
Hematocrit: 36.9 % (ref 34.0–46.6)
Hemoglobin: 12 g/dL (ref 11.1–15.9)
MCH: 26.8 pg (ref 26.6–33.0)
MCHC: 32.5 g/dL (ref 31.5–35.7)
MCV: 83 fL (ref 79–97)
PLATELETS: 385 10*3/uL — AB (ref 150–379)
RBC: 4.47 x10E6/uL (ref 3.77–5.28)
RDW: 14.4 % (ref 12.3–15.4)
WBC: 9.4 10*3/uL (ref 3.4–10.8)

## 2015-08-20 LAB — BMP8+EGFR
BUN / CREAT RATIO: 13 (ref 9–23)
BUN: 16 mg/dL (ref 6–24)
CHLORIDE: 96 mmol/L (ref 96–106)
CO2: 21 mmol/L (ref 18–29)
Calcium: 9.7 mg/dL (ref 8.7–10.2)
Creatinine, Ser: 1.24 mg/dL — ABNORMAL HIGH (ref 0.57–1.00)
GFR calc non Af Amer: 48 mL/min/{1.73_m2} — ABNORMAL LOW (ref >59)
GFR, EST AFRICAN AMERICAN: 55 mL/min/{1.73_m2} — AB (ref >59)
GLUCOSE: 95 mg/dL (ref 65–99)
Potassium: 4.5 mmol/L (ref 3.5–5.2)
SODIUM: 135 mmol/L (ref 134–144)

## 2015-08-25 ENCOUNTER — Ambulatory Visit (INDEPENDENT_AMBULATORY_CARE_PROVIDER_SITE_OTHER): Payer: BLUE CROSS/BLUE SHIELD | Admitting: Obstetrics and Gynecology

## 2015-08-25 ENCOUNTER — Encounter: Payer: Self-pay | Admitting: Obstetrics and Gynecology

## 2015-08-25 VITALS — BP 132/80 | HR 64 | Resp 12 | Ht 65.5 in | Wt 180.0 lb

## 2015-08-25 DIAGNOSIS — N814 Uterovaginal prolapse, unspecified: Secondary | ICD-10-CM

## 2015-08-25 DIAGNOSIS — N8110 Cystocele, unspecified: Secondary | ICD-10-CM | POA: Diagnosis not present

## 2015-08-25 DIAGNOSIS — IMO0002 Reserved for concepts with insufficient information to code with codable children: Secondary | ICD-10-CM

## 2015-08-25 DIAGNOSIS — R339 Retention of urine, unspecified: Secondary | ICD-10-CM | POA: Diagnosis not present

## 2015-08-25 NOTE — Progress Notes (Signed)
Patient ID: Heather Leach, female   DOB: [birth year], 60 y.o.   MRN: [id number] 60 y.o. [place] here for a consultation by Dr Assunta Found for uterine prolapse. She reports a several year h/o worsening uterine prolapse. She was fitted with a pessary x 1, it fell out and they didn't try another one. She does a lot of lifting at work. She has trouble empty her bladder completely. If she reduces her prolapse she has a better flow. No problems with bowel movements.  Not sexually active. She has been getting a lot of UTI's lately. No vaginal bleeding. No irritation. She does c/o increasing back pain in the last few months. The back pain is worse when she is prolapsing severely. She used to be able to push the prolapse in and it would stay in for most of the day. Over the last few months it's not staying in.     No LMP recorded. Patient is postmenopausal.          Sexually active: No.  The current method of family planning is post menopausal status.    Exercising: No.  The patient does not participate in regular exercise at present. Smoker:  no  Health Maintenance: Pap:  10-28-14 WNL  History of abnormal Pap:  no MMG:  11-03-11 WNL Colonoscopy:  Never BMD:   Never TDaP:  unsure Gardasil: N/A   reports that she has never smoked. She has never used smokeless tobacco. She reports that she does not drink alcohol or use illicit drugs. One son, lives local, 3 grandchildren.   Past Medical History  Diagnosis Date  . Hyperlipidemia   . 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 Urinary frequency 10/28/2014  . Urinary incontinence 10/28/2014  . Herpes simplex virus (HSV) infection   . Vaginal itching 10/28/2014  . Fibroid   . Vertigo   . Prediabetes     Past Surgical History  Procedure Laterality Date  . Tubal ligation    . Breast biopsy      Current Outpatient Prescriptions  Medication Sig Dispense Refill  . meclizine (ANTIVERT) 25 MG tablet Take 25 mg by mouth 3 (three) times  daily as needed for dizziness.     No current facility-administered medications for this visit.    Family History  Problem Relation Age of Onset  . Heart failure Mother   . Diabetes Mother   . Aneurysm Father   . Diabetes Maternal Grandmother   . Diabetes Other     mom's siblings, maternal cousins    Review of Systems  Constitutional: Negative.   HENT: Negative.   Eyes: Negative.   Respiratory: Negative.   Cardiovascular: Negative.   Gastrointestinal: Negative.   Endocrine: Negative.   Genitourinary: Positive for frequency.       Uterine prolapse  Problems voiding Bladder fullness  Pelvic pain  Musculoskeletal: Positive for back pain.  Skin: Negative.   Allergic/Immunologic: Negative.   Neurological: Negative.   Psychiatric/Behavioral: Negative.     Exam:   BP 132/80 mmHg  Pulse 64  Resp 12  Ht 5' 5.5" (1.664 m)  Wt 180 lb (81.647 kg)  BMI 29.49 kg/m2  Weight change: @WEIGHTCHANGE @ Height:   Height: 5' 5.5" (166.4 cm)  Ht Readings from Last 3 Encounters:  08/25/15 5' 5.5" (1.664 m)  08/19/15 5\' 5"  (1.651 m)  08/11/15 5\' 5"  (1.651 m)    General appearance: alert, cooperative and appears stated age Head: Normocephalic, without obvious abnormality, atraumatic Neck: no adenopathy, supple, symmetrical, trachea  midline and thyroid normal to inspection and palpation Lungs: clear to auscultation bilaterally Heart: regular rate and rhythm Abdomen: soft, non-tender; bowel sounds normal; no masses,  no organomegaly Extremities: extremities normal, atraumatic, no cyanosis or edema Skin: Skin color, texture, turgor normal. No rashes or lesions Lymph nodes: Cervical, supraclavicular, and axillary nodes normal. No abnormal inguinal nodes palpated Neurologic: Grossly normal   Pelvic: External genitalia:  no lesions              Urethra:  normal appearing urethra with no masses, tenderness or lesions              Bartholins and Skenes: normal                 Vagina:  Grade 3-4 cystocele, grade 3-4 uterine prolapse, no rectocele              Cervix: no lesions               Bimanual Exam:  Uterus:  The uterus feels slightly enlarged and irregularly shaped. C/W her h/o a fibroid uterus              Adnexa: no mass, fullness, tenderness               Rectovaginal: Confirms               Anus:  normal sphincter tone, no lesions  PVR: 270 cc  Chaperone was present for exam.  A:  Uterine prolapse  Cystocele  Urinary retention  P:   Given her urinary retention doing nothing is not an option  Discussed her options of using a pessary or surgery, she would need a pessary like a gellhorn that she wouldn't be able to insert and remove on her own  Discussed the options of vaginal vs abdominal surgery. Given her age and her active life I would recommend she have a TAH/BS with sacrocolpopexy for better long term results  I did discuss that lifting and straining are not good for prolapse, even after surgery  She would like to proceed with surgery, will set up a consultation with Dr Quincy Simmonds who does the sacrocolpopexy. Will will do the surgery together  CC: Dr Assunta Found

## 2015-08-25 NOTE — Patient Instructions (Signed)

## 2015-08-27 DIAGNOSIS — Z029 Encounter for administrative examinations, unspecified: Secondary | ICD-10-CM

## 2015-08-30 ENCOUNTER — Ambulatory Visit (INDEPENDENT_AMBULATORY_CARE_PROVIDER_SITE_OTHER): Payer: BLUE CROSS/BLUE SHIELD | Admitting: Obstetrics and Gynecology

## 2015-08-30 ENCOUNTER — Encounter: Payer: Self-pay | Admitting: Obstetrics and Gynecology

## 2015-08-30 ENCOUNTER — Other Ambulatory Visit: Payer: Self-pay | Admitting: Obstetrics and Gynecology

## 2015-08-30 VITALS — BP 140/78 | HR 60 | Resp 16 | Ht 65.5 in | Wt 181.6 lb

## 2015-08-30 DIAGNOSIS — N898 Other specified noninflammatory disorders of vagina: Secondary | ICD-10-CM

## 2015-08-30 DIAGNOSIS — N393 Stress incontinence (female) (male): Secondary | ICD-10-CM | POA: Diagnosis not present

## 2015-08-30 DIAGNOSIS — Z124 Encounter for screening for malignant neoplasm of cervix: Secondary | ICD-10-CM

## 2015-08-30 DIAGNOSIS — N813 Complete uterovaginal prolapse: Secondary | ICD-10-CM | POA: Diagnosis not present

## 2015-08-30 DIAGNOSIS — N852 Hypertrophy of uterus: Secondary | ICD-10-CM | POA: Diagnosis not present

## 2015-08-30 DIAGNOSIS — R339 Retention of urine, unspecified: Secondary | ICD-10-CM | POA: Diagnosis not present

## 2015-08-30 NOTE — Progress Notes (Signed)
Patient ID: Heather Leach, female   DOB: 1955-11-24, 60 y.o.   MRN: [id number] GYNECOLOGY  VISIT   HPI: 60 y.o.   Single  African American  female   [id number] with Patient's last menstrual period was 06/27/2011 (approximate).   here for uterine prolapse, urinary retention and cystocele.  Patient wants to discuss possibility of surgery.  Patient seen by Dr. Talbert Nan 08/25/15 and was diagnosed with complete uterovaginal prolapse and urinary retention of 270 cc with catheterization.  Worsening prolapse for 8 years.  Patient has tried pessary use but it was not very effective. Is able to void but reduces the prolapse in order to complete voiding.  Having recurrent UTIs. Urinary incontinence with lifting and pulling at work. 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  No leak for no reason at all.  Has nocturia at night. Reports urgency.  Wears a pad constantly.  No difficulty with bowel movements.  Denies fecal incontinence. Feels a lot of bladder and pelvic pressure.  Has known fibroids.  2 first cousins here as well to participate in the discussion today.   Patient is "prediabetic" but not on any medication.  HgbA1C - 7.1 on 08/11/15.  GYNECOLOGIC HISTORY: Patient's last menstrual period was 06/27/2011 (approximate). Contraception:Tubal/Postmenopausal Menopausal hormone therapy: none Last mammogram: 11-03-11 dense/Neg/BiRads1:[facility] Last pap smear: 10-28-14 Neg and negative HR HPV.         OB History    Gravida Para Term Preterm AB TAB SAB Ectopic Multiple Living   2 1 1  1  0    1         Patient Active Problem List   Diagnosis Date Noted  . Prediabetes   . Essential hypertension 08/19/2015  . Uterine prolapse 08/19/2015  . Syncope and collapse 08/11/2015  . Hypokalemia 08/11/2015  . Orthostatic hypotension 08/11/2015  . Vertigo 08/11/2015  . BMI 31.0-31.9,adult 06/12/2015  . Elevated blood pressure 06/12/2015  . Tinea corporis 06/12/2015  . Pre-diabetes  06/12/2015  . 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 Urinary frequency 10/28/2014  . Urinary incontinence 10/28/2014  . Vaginal itching 10/28/2014  . Hyperlipidemia 05/18/2010  . OVERWEIGHT 05/18/2010  . PRECORDIAL PAIN 05/18/2010  . ABNORMAL STRESS ELECTROCARDIOGRAM 05/18/2010    Past Medical History  Diagnosis Date  . Hyperlipidemia   . 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 Urinary frequency 10/28/2014  . Urinary incontinence 10/28/2014  . Herpes simplex virus (HSV) infection   . Vaginal itching 10/28/2014  . Fibroid   . Vertigo   . Prediabetes     Past Surgical History  Procedure Laterality Date  . Tubal ligation    . Breast biopsy      Current Outpatient Prescriptions  Medication Sig Dispense Refill  . meclizine (ANTIVERT) 25 MG tablet Take 25 mg by mouth 3 (three) times daily as needed for dizziness. Reported on 08/30/2015     No current facility-administered medications for this visit.     ALLERGIES: Review of patient's allergies indicates no known allergies.  Family History  Problem Relation Age of Onset  . Heart failure Mother   . Diabetes Mother   . Aneurysm Father   . Diabetes Maternal Grandmother   . Diabetes Other     mom's siblings, maternal cousins    Social History   Social History  . Marital Status: Single    Spouse Name: N/A  . Number of Children: N/A  . Years of Education: N/A   Occupational History  . Works at [employer]  Social History Main Topics  . Smoking status: Never Smoker   . Smokeless tobacco: Never Used  . Alcohol Use: No  . Drug Use: No  . Sexual Activity: No     Comment: tubal/postmenopausal   Other Topics Concern  . Not on file   Social History Narrative   Single    ROS:  Pertinent items are noted in HPI.  PHYSICAL EXAMINATION:    BP 140/78 mmHg  Pulse 60  Resp 16  Ht 5' 5.5" (1.664 m)  Wt 181 lb 9.6 oz (82.373 kg)  BMI 29.75 kg/m2  LMP 06/27/2011 (Approximate)    General appearance: alert, cooperative and  appears stated age Head: Normocephalic, without obvious abnormality, atraumatic Lungs: clear to auscultation bilaterally Heart: regular rate and rhythm Abdomen: soft, non-tender; bowel sounds normal; no masses,  no organomegaly Extremities: extremities normal, atraumatic, no cyanosis or edema No abnormal inguinal nodes palpated Neurologic: Grossly normal  Pelvic: External genitalia:  no lesions              Urethra:  normal appearing urethra with no masses, tenderness or lesions              Bartholins and Skenes: normal                 Vagina: normal appearing vagina with normal color and discharge, no lesions.  Has complete cervical - uterine prolapse.  Left side of vaginal apex with 3 cm pigmented brown mucosa.               Cervix: no lesions              Pap taken: Yes.   Bimanual Exam:  Uterus:  enlarged,  14 weeks size              Adnexa: no mass, but difficult to feel separately from the uterus.               Rectovaginal: Yes.  .  Confirms.              Anus:  normal sphincter tone, no lesions  Chaperone was present for exam.  ASSESSMENT   Complete uterovaginal prolapse. Uterine enlargement.  Suspected fibroids.  Cervical elongation.  Vaginal lesion (discoloration). Urinary stress incontinence.  Urinary retention.  Elevated HgbA1C consistent with diabetes.  PLAN  I have had a comprehensive discussion with the patient regarding prolapse and urinary incontinence.  I have provided reading materials from ACOG regarding prolapse and incontinence in general as well as medical and surgical treatment for these conditions.   Medical treatments may include pessary care and surgical care would include an abdominal hysterectomy with bilateral salpingo-oophorectomy, abdominal sacrocolpopexy with permanent graft, anterior and posterior colporrhaphy, and TVT Exact midurethral sling (with cystoscopy).   We discussed benefits and risks of surgery which include but are not limited to  bleeding, infection, damage to surrounding organs, ureteral damage, vaginal pain with intercourse, permanent mesh use which may cause erosion and exposure in the vagina, urethra, bladder or ureters, dyspareunia, reoperation, recurrence of prolapse and incontinence,  DVT, PE, death, and reaction to anesthesia.    I have discussed surgical expectations regarding the procedures and success rates, outcomes, and recovery.     Will proceed with pelvic ultrasound visit with Dr. Talbert Nan to define anatomy and discuss hysterectomy, which she would be performing and I would be assisting.   Patient understands my role would be to do her pelvic reconstruction portion of the surgery and that  Dr. Talbert Nan would be assisting.   I have done a pap of the left vaginal Mallis and of the endocervix today.   I have encouraged good glucose control in preparation for surgery.   An After Visit Summary was printed and given to the patient.  ___40___ minutes face to face time of which over 50% was spent in counseling.

## 2015-08-31 ENCOUNTER — Telehealth: Payer: Self-pay | Admitting: Obstetrics and Gynecology

## 2015-08-31 NOTE — Telephone Encounter (Signed)
Spoke with pt regarding benefit for ultrasound. Patient understood and agreeable. Patient ready to schedule. Patient scheduled 09/02/15 with Talbert Nan. Pt aware of arrival date and time. Pt aware of 72 hours cancellation policy with [id number] fee. No further questions. Ok to close

## 2015-09-01 LAB — CYTOLOGY - PAP

## 2015-09-02 ENCOUNTER — Telehealth: Payer: Self-pay

## 2015-09-02 ENCOUNTER — Encounter: Payer: Self-pay | Admitting: Obstetrics and Gynecology

## 2015-09-02 ENCOUNTER — Ambulatory Visit (INDEPENDENT_AMBULATORY_CARE_PROVIDER_SITE_OTHER): Payer: BLUE CROSS/BLUE SHIELD

## 2015-09-02 ENCOUNTER — Ambulatory Visit (INDEPENDENT_AMBULATORY_CARE_PROVIDER_SITE_OTHER): Payer: BLUE CROSS/BLUE SHIELD | Admitting: Obstetrics and Gynecology

## 2015-09-02 VITALS — BP 130/80 | HR 60 | Resp 13 | Wt 181.0 lb

## 2015-09-02 DIAGNOSIS — D259 Leiomyoma of uterus, unspecified: Secondary | ICD-10-CM

## 2015-09-02 DIAGNOSIS — R9389 Abnormal findings on diagnostic imaging of other specified body structures: Secondary | ICD-10-CM

## 2015-09-02 DIAGNOSIS — R938 Abnormal findings on diagnostic imaging of other specified body structures: Secondary | ICD-10-CM

## 2015-09-02 DIAGNOSIS — N950 Postmenopausal bleeding: Secondary | ICD-10-CM | POA: Diagnosis not present

## 2015-09-02 DIAGNOSIS — N852 Hypertrophy of uterus: Secondary | ICD-10-CM

## 2015-09-02 DIAGNOSIS — N819 Female genital prolapse, unspecified: Secondary | ICD-10-CM

## 2015-09-02 NOTE — Telephone Encounter (Signed)
Called patient at [phone] to discuss pap smear results, left message on voicemail to call me back.

## 2015-09-02 NOTE — Telephone Encounter (Signed)
Patient notified of pap results at ultrasound appointment today by Dr. Talbert Nan.

## 2015-09-02 NOTE — Progress Notes (Signed)
Patient ID: Heather Leach, female   DOB: 07-18-1955, 60 y.o.   MRN: [id number] GYNECOLOGY  VISIT   HPI: 60 y.o.   Single  African American  female   [id number] with Patient's last menstrual period was 06/27/2011 (approximate).   here for Pelvic U/S. The patient has genital prolapse and a h/o a fibroid uterus, her uterus felt enlarged on exam. She reports one episode of vaginal spotting in the last month.   GYNECOLOGIC HISTORY: Patient's last menstrual period was 06/27/2011 (approximate). Contraception:postmenopause Menopausal hormone therapy: none         OB History    Gravida Para Term Preterm AB TAB SAB Ectopic Multiple Living   2 1 1  1  0    1         Patient Active Problem List   Diagnosis Date Noted  . Prediabetes   . Essential hypertension 08/19/2015  . Uterine prolapse 08/19/2015  . Syncope and collapse 08/11/2015  . Hypokalemia 08/11/2015  . Orthostatic hypotension 08/11/2015  . Vertigo 08/11/2015  . BMI 31.0-31.9,adult 06/12/2015  . Elevated blood pressure 06/12/2015  . Tinea corporis 06/12/2015  . Pre-diabetes 06/12/2015  . 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 Urinary frequency 10/28/2014  . Urinary incontinence 10/28/2014  . Vaginal itching 10/28/2014  . Hyperlipidemia 05/18/2010  . OVERWEIGHT 05/18/2010  . PRECORDIAL PAIN 05/18/2010  . ABNORMAL STRESS ELECTROCARDIOGRAM 05/18/2010    Past Medical History  Diagnosis Date  . Hyperlipidemia   . 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 Urinary frequency 10/28/2014  . Urinary incontinence 10/28/2014  . Herpes simplex virus (HSV) infection   . Vaginal itching 10/28/2014  . Fibroid   . Vertigo   . Prediabetes     Past Surgical History  Procedure Laterality Date  . Tubal ligation    . Breast biopsy      Current Outpatient Prescriptions  Medication Sig Dispense Refill  . meclizine (ANTIVERT) 25 MG tablet Take 25 mg by mouth 3 (three) times daily as needed for dizziness. Reported on 08/30/2015     No current  facility-administered medications for this visit.     ALLERGIES: Review of patient's allergies indicates no known allergies.  Family History  Problem Relation Age of Onset  . Heart failure Mother   . Diabetes Mother   . Aneurysm Father   . Diabetes Maternal Grandmother   . Diabetes Other     mom's siblings, maternal cousins    Social History   Social History  . Marital Status: Single    Spouse Name: N/A  . Number of Children: N/A  . Years of Education: N/A   Occupational History  . Works at [employer]  . Smoking status: Never Smoker   . Smokeless tobacco: Never Used  . Alcohol Use: No  . Drug Use: No  . Sexual Activity: No     Comment: tubal/postmenopausal   Other Topics Concern  . Not on file   Social History Narrative   Single    Review of Systems  Constitutional: Negative.   HENT: Negative.   Eyes: Negative.   Respiratory: Negative.   Cardiovascular: Negative.   Gastrointestinal: Negative.   Genitourinary: Negative.   Musculoskeletal: Negative.   Skin: Negative.   Neurological: Negative.   Endo/Heme/Allergies: Negative.   Psychiatric/Behavioral: Negative.     PHYSICAL EXAMINATION:    BP 130/80 mmHg  Pulse 60  Resp 13  Wt 181 lb (82.101 kg)  LMP 06/27/2011 (Approximate)    General  appearance: alert, cooperative and appears stated age  Pelvic: External genitalia:  no lesions              Urethra:  normal appearing urethra with no masses, tenderness or lesions              Bartholins and Skenes: normal                 Vagina:complete uterine procidentia. Grade 4 cystocele  The risks of endometrial biopsy were reviewed and a consent was obtained.  The cervix which was outside of her vagina was cleansed with betadine. A tenaculum was placed on the cervix and the mini-pipelle was placed into the endometrial cavity. The uterus sounded to 10 cm. The endometrial biopsy was performed, minimal tissue was obtained, some fluid  was in the pipelle. The tenaculum was removed. There were no complications. The patient tolerated the procedure very well   Chaperone was present for exam.  ASSESSMENT Uterine procidentia, grade 4 cystocele Fibroid uterus, images reviewed with the patient Thickened endometrial stripe and one episode of PMP bleeding    PLAN Endometrial biopsy done today Again discussed her options of a pessary, vaginal surgery of abdominal surgery, including sacrocolpopexy. Reviewed that she would have a higher chance of long term success with prolapse repair with the sacrocolpopexy than with a vaginal procedure.  She desires to proceed with surgery. We discussed the risks and benefits of retaining and removing her ovaries, she desires removal. Plan: Total abdominal hysterectomy with bilateral salpingo-oophorectomy. Dr Quincy Simmonds will perform sacrocolpopexy and TVT.  Discussed the risks of hysterectomy, including, but not limited to: bleeding, infection, damage to bowel/bladder/vessels/ureters   An After Visit Summary was printed and given to the patient.  25 minutes face to face time of which over 50% was spent in counseling.

## 2015-09-06 ENCOUNTER — Ambulatory Visit (HOSPITAL_COMMUNITY): Payer: BLUE CROSS/BLUE SHIELD | Admitting: Physical Therapy

## 2015-09-08 ENCOUNTER — Telehealth: Payer: Self-pay | Admitting: Obstetrics and Gynecology

## 2015-09-08 NOTE — Telephone Encounter (Signed)
Spoke with patient regarding in-patient professional surgery benefits. Pt is agreeable and to call back on 09/16/15 to pay deposit

## 2015-09-10 ENCOUNTER — Ambulatory Visit (INDEPENDENT_AMBULATORY_CARE_PROVIDER_SITE_OTHER): Payer: BLUE CROSS/BLUE SHIELD | Admitting: Pediatrics

## 2015-09-10 ENCOUNTER — Encounter (INDEPENDENT_AMBULATORY_CARE_PROVIDER_SITE_OTHER): Payer: Self-pay

## 2015-09-10 ENCOUNTER — Encounter: Payer: Self-pay | Admitting: Pediatrics

## 2015-09-10 VITALS — BP 144/70 | HR 58 | Temp 97.0°F | Ht 65.5 in | Wt 186.4 lb

## 2015-09-10 DIAGNOSIS — K649 Unspecified hemorrhoids: Secondary | ICD-10-CM

## 2015-09-10 DIAGNOSIS — K625 Hemorrhage of anus and rectum: Secondary | ICD-10-CM

## 2015-09-10 DIAGNOSIS — R030 Elevated blood-pressure reading, without diagnosis of hypertension: Secondary | ICD-10-CM | POA: Diagnosis not present

## 2015-09-10 DIAGNOSIS — N814 Uterovaginal prolapse, unspecified: Secondary | ICD-10-CM | POA: Diagnosis not present

## 2015-09-10 DIAGNOSIS — IMO0001 Reserved for inherently not codable concepts without codable children: Secondary | ICD-10-CM

## 2015-09-10 DIAGNOSIS — Z1211 Encounter for screening for malignant neoplasm of colon: Secondary | ICD-10-CM | POA: Diagnosis not present

## 2015-09-10 NOTE — Progress Notes (Signed)
    Subjective:    Patient ID: Heather Leach, female    DOB: [birth year], 60 y.o.   MRN: [id number]  CC: Rectal Bleeding   HPI: Heather Leach is a 60 y.o. female presenting for Rectal Bleeding  Prolapse: upcoming surgery, needs to schedule, for hysterectomy and reconstruction  Had two episodes of painless rectal bleeidng in the last week Has never happened before. Last episode several days ago, having normal bowel movements now, no constipation, no pain with passing stools No dark colored, maroon or black stools  Elevated BP: no HA or vision changes, no lightheadedness or dizziness. hasnt been checking at home but does have access to a cuff.  Vertigo ongoing but improving. Takes meclizine as needed.  Relevant past medical, surgical, family and social history reviewed and updated as indicated. Interim medical history since our last visit reviewed. Allergies and medications reviewed and updated.    ROS: Per HPI unless specifically indicated above  History  Smoking status  . Never Smoker   Smokeless tobacco  . Never Used    Past Medical History Patient Active Problem List   Diagnosis Date Noted  . Prediabetes   . Essential hypertension 08/19/2015  . Uterine prolapse 08/19/2015  . Syncope and collapse 08/11/2015  . Hypokalemia 08/11/2015  . Orthostatic hypotension 08/11/2015  . Vertigo 08/11/2015  . BMI 31.0-31.9,adult 06/12/2015  . Elevated blood pressure 06/12/2015  . Tinea corporis 06/12/2015  . Pre-diabetes 06/12/2015  . 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 Urinary frequency 10/28/2014  . Urinary incontinence 10/28/2014  . Vaginal itching 10/28/2014  . Hyperlipidemia 05/18/2010  . OVERWEIGHT 05/18/2010  . PRECORDIAL PAIN 05/18/2010  . ABNORMAL STRESS ELECTROCARDIOGRAM 05/18/2010       Objective:    BP 144/70 mmHg  Pulse 58  Temp(Src) 97 F (36.1 C) (Oral)  Ht 5' 5.5" (1.664 m)  Wt 186 lb 6.4 oz (84.55 kg)  BMI 30.54 kg/m2  LMP 06/27/2011 (Approximate)    Wt Readings from Last 3 Encounters:  09/10/15 186 lb 6.4 oz (84.55 kg)  09/02/15 181 lb (82.101 kg)  08/30/15 181 lb 9.6 oz (82.373 kg)    Gen: NAD, alert, cooperative with exam, NCAT EYES: EOMI, no scleral injection or icterus ENT:  OP without erythema CV: NRRR, normal S1/S2, no murmur, distal pulses 2+ b/l Resp: CTABL, no wheezes, normal WOB Abd: +BS, soft, NTND. Rectal: Soft external hemorrhoid, non tender, no bleeding. Normal rectal tone. Prolapsed uterus present. Neuro: Alert and oriented, strength equal b/l UE and LE, coordination grossly normal      Assessment & Plan:    Heather Leach was seen today for rectal bleeding, likely due to hemorrhoids per exam, no bleeding last few days, will let me know if returns. Will get labs then. Due for screenign colonoscopy as well, will refer to GI.   Diagnoses and all orders for this visit:  Screening for colon cancer -     Ambulatory referral to Gastroenterology  Uterine prolapse Upcoming surgery  Rectal bleeding  Elevated blood pressure Will check at home, other BPs in system have been well-controlled recently. Let me know if elevated.  Hemorrhoids, unspecified hemorrhoid type High fiber diet, no straining   Follow up plan: 6 months  Assunta Found, MD [facility] 09/10/2015, 9:56 AM

## 2015-09-15 NOTE — Telephone Encounter (Signed)
Called patient to follow up regarding recommended procedure. Left Voicemail requesting a call back.

## 2015-09-27 NOTE — Telephone Encounter (Signed)
Spoke with patient to follow up regarding the professional surgery benefits. Patient advised again she will proceed with surgery. Patient states will contact our office on 09/27/15 to pay surgery deposit

## 2015-09-28 NOTE — Telephone Encounter (Signed)
Received call from patients sister, Dannica Bodin, She called to provide payment for surgery deposit. Nothing was discussed regarding this patient (as sister is not on DPR). I confirmed payment and requested to have patient to call. Upon patients return call I will advise patient chart has been forwarded to [facility] for scheduling. Routing to [place] for scheduling

## 2015-09-28 NOTE — Telephone Encounter (Signed)
Patient left message on our voicemail during lunch returning your call. [phone]

## 2015-09-30 NOTE — Telephone Encounter (Signed)
Patient returned my call.  I confirmed with patient her surgery pre-payment was received 09/28/15. I reiterated this is the professional benefits only. The hospital with contact her for their benefits. I advised patient she will receive a call from [facility] for scheduling. Patient is agreeable to this information. Chart has been forwarded to [facility] for scheduling.

## 2015-10-04 ENCOUNTER — Telehealth: Payer: Self-pay | Admitting: *Deleted

## 2015-10-04 NOTE — Telephone Encounter (Signed)
Please let Dr. Talbert Nan and I know if patient has a date scheduled for surgery.  Thank you!  Cc- Dr. Talbert Nan

## 2015-10-04 NOTE — Telephone Encounter (Signed)
See next phone note.  Routing to provider for final review. Patient agreeable to disposition. Will close encounter.    

## 2015-10-04 NOTE — Telephone Encounter (Signed)
Call to patient regarding surgery scheduling date options. Left message to call back.

## 2015-10-05 NOTE — Telephone Encounter (Signed)
Call to patient regarding surgery date options. Left message to call back.

## 2015-10-06 NOTE — Telephone Encounter (Signed)
Patient returned call but call disconnected before I reached the phone. Call back to patient. Left message to call back.

## 2015-10-07 NOTE — Telephone Encounter (Signed)
Patient returning call.

## 2015-10-07 NOTE — Telephone Encounter (Signed)
Call to patient regarding surgery date options. Patient still very hesitant to commit to a date. States she is nervous about surgery despite knowing how much she needs it and her family encouraging her to proceed. Initially requested June date but also discussed dates in mid-July and late May. Has a trip planned in mid-May.  Advised we are not trying to pressure her in any way. She needs to be comfortable with decision.  Patient states she will discuss with family over holiday weekend and call back on Monday with decision.    Routing to provider for final review. Patient agreeable to disposition. Will close encounter.

## 2015-10-12 ENCOUNTER — Telehealth: Payer: Self-pay | Admitting: *Deleted

## 2015-10-12 NOTE — Telephone Encounter (Signed)
Patient calls to schedule surgery. States she has decided she would like to proceed with Nov 16, 2015. Advised this date is still available and will schedule this and then call her back to confirm and finalize.

## 2015-10-14 ENCOUNTER — Ambulatory Visit: Payer: BLUE CROSS/BLUE SHIELD | Admitting: Family

## 2015-10-15 ENCOUNTER — Ambulatory Visit: Payer: BLUE CROSS/BLUE SHIELD | Admitting: Family Medicine

## 2015-10-19 NOTE — Telephone Encounter (Signed)
Patient is asking to talk with Gay Filler again. Patient has a few questions for Gay Filler.

## 2015-10-19 NOTE — Telephone Encounter (Signed)
Return call to patient. Surgery scheduled for 11-16-15 at 0730 at [place] scheduled with Dr Quincy Simmonds and Dr Talbert Nan to review surgical procedure. Will review surgical instructions with patient at appointment 10-22-15.  Routing to provider for final review. Patient agreeable to disposition. Will close encounter.

## 2015-10-22 ENCOUNTER — Encounter: Payer: Self-pay | Admitting: Obstetrics and Gynecology

## 2015-10-22 ENCOUNTER — Other Ambulatory Visit: Payer: Self-pay | Admitting: Obstetrics and Gynecology

## 2015-10-22 ENCOUNTER — Telehealth: Payer: Self-pay | Admitting: *Deleted

## 2015-10-22 ENCOUNTER — Ambulatory Visit (INDEPENDENT_AMBULATORY_CARE_PROVIDER_SITE_OTHER): Payer: BLUE CROSS/BLUE SHIELD | Admitting: Obstetrics and Gynecology

## 2015-10-22 ENCOUNTER — Telehealth: Payer: Self-pay | Admitting: Family Medicine

## 2015-10-22 VITALS — BP 144/80 | HR 60 | Ht 65.5 in | Wt 184.2 lb

## 2015-10-22 DIAGNOSIS — N813 Complete uterovaginal prolapse: Secondary | ICD-10-CM | POA: Diagnosis not present

## 2015-10-22 DIAGNOSIS — D259 Leiomyoma of uterus, unspecified: Secondary | ICD-10-CM | POA: Diagnosis not present

## 2015-10-22 DIAGNOSIS — R32 Unspecified urinary incontinence: Secondary | ICD-10-CM | POA: Diagnosis not present

## 2015-10-22 DIAGNOSIS — Z1231 Encounter for screening mammogram for malignant neoplasm of breast: Secondary | ICD-10-CM

## 2015-10-22 NOTE — Progress Notes (Signed)
Scheduled patient for bilateral screening mammogram while in office at [facility] on 10/27/2015 at 10:30 am. Patient is agreeable to date and time.

## 2015-10-22 NOTE — Telephone Encounter (Signed)
I am not aware of her A1C, but of course will be glad to discus at the visit.   We can also discuss pre-op clearance, although it is an evaluation and we may need additional medications or evaluation to clear her.   Laroy Apple, MD [facility] 10/22/2015, 12:26 PM

## 2015-10-22 NOTE — Telephone Encounter (Signed)
Call to PCP, Dr Wendi Snipes office. Patient scheduled for appointment 10-28-15. Advised of upcoming surgery on elevated HgbA1C. Wanted to be sure they were aware so could possibly begin treatment and get A1C down prior to surgery. They will send message to Dr Wendi Snipes.

## 2015-10-22 NOTE — Progress Notes (Signed)
Patient ID: Heather Leach, female   DOB: December 06, 1955, 60 y.o.   MRN: [id number] GYNECOLOGY  VISIT   HPI: 60 y.o.   Single  African American  female   [id number] with Patient's last menstrual period was 06/27/2011 (approximate).   here for surgical consult.    Has complete uterovaginal prolapse and a fibroid uterus.  Feels like the prolapse is progressive. Pelvic ultrasound in March confirmed multifibroid uterus with endometrial thickening and polyp versus submucous fibroid.  Normal ovaries.  EMB with Dr. Talbert Nan - benign endometrium.  Has leakage of urine with lifting and pulling.  Denies fecal incontinence and constipation.  Had urinary retention of 270 cc by office I and O cath with Dr. Talbert Nan.   Pessary was uncomfortable in the past.   States her blood sugar is not monitored.  Will see a PCP next week.  Hgb A1C was 7.1 two months ago.  Patient is not on medication for her glucose control.   She has wanted to do dietary modification.   Had an episode 3 weeks ago of blood with a bowel movement when she was constipated. She thinks she strained.  Saw her PCP and was told everything was OK but that she will need a colonoscopy after her surgery is done.  Had a stool card test which was negative per patient.  Has never had rectal bleeding in the past or since.  No know hemorrhoids.   PCP - Dr. Evette Doffing in [place], [place].  [facility].  GYNECOLOGIC HISTORY: Patient's last menstrual period was 06/27/2011 (approximate). Contraception:  Tubal/postmenopausal Menopausal hormone therapy:  none Last mammogram:  11-03-11 dense/Neg/BiRads1:[facility] Last pap smear: 10-28-14 Neg and negative HR HPV.           OB History    Gravida Para Term Preterm AB TAB SAB Ectopic Multiple Living   2 1 1  1  0    1         Patient Active Problem List   Diagnosis Date Noted  . Prediabetes   . Essential hypertension 08/19/2015  . Uterine prolapse 08/19/2015  . Syncope and collapse 08/11/2015   . Hypokalemia 08/11/2015  . Orthostatic hypotension 08/11/2015  . Vertigo 08/11/2015  . BMI 31.0-31.9,adult 06/12/2015  . Elevated blood pressure 06/12/2015  . Tinea corporis 06/12/2015  . Pre-diabetes 06/12/2015  . 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 Urinary frequency 10/28/2014  . Urinary incontinence 10/28/2014  . Vaginal itching 10/28/2014  . Hyperlipidemia 05/18/2010  . OVERWEIGHT 05/18/2010  . PRECORDIAL PAIN 05/18/2010  . ABNORMAL STRESS ELECTROCARDIOGRAM 05/18/2010    Past Medical History  Diagnosis Date  . Hyperlipidemia   . 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 Urinary frequency 10/28/2014  . Urinary incontinence 10/28/2014  . Herpes simplex virus (HSV) infection   . Vaginal itching 10/28/2014  . Fibroid   . Vertigo   . Prediabetes     Past Surgical History  Procedure Laterality Date  . Tubal ligation    . Breast biopsy      Current Outpatient Prescriptions  Medication Sig Dispense Refill  . meclizine (ANTIVERT) 25 MG tablet Take 25 mg by mouth 3 (three) times daily as needed for dizziness. Reported on 08/30/2015     No current facility-administered medications for this visit.     ALLERGIES: Review of patient's allergies indicates no known allergies.  Family History  Problem Relation Age of Onset  . Heart failure Mother   . Diabetes Mother   . Aneurysm Father   . Diabetes  Maternal Grandmother   . Diabetes Other     mom's siblings, maternal cousins    Social History   Social History  . Marital Status: Single    Spouse Name: N/A  . Number of Children: N/A  . Years of Education: N/A   Occupational History  . Works at [employer]  . Smoking status: Never Smoker   . Smokeless tobacco: Never Used  . Alcohol Use: No  . Drug Use: No  . Sexual Activity: No     Comment: tubal/postmenopausal   Other Topics Concern  . Not on file   Social History Narrative   Single    ROS:  Pertinent items are noted in  HPI.  PHYSICAL EXAMINATION:    BP 144/80 mmHg  Pulse 60  Ht 5' 5.5" (1.664 m)  Wt 184 lb 3.2 oz (83.553 kg)  BMI 30.18 kg/m2  LMP 06/27/2011 (Approximate)    General appearance: alert, cooperative and appears stated age Head: Normocephalic, without obvious abnormality, atraumatic Neck: no adenopathy, supple, symmetrical, trachea midline and thyroid normal to inspection and palpation Lungs: clear to auscultation bilaterally Heart: regular rate and rhythm Abdomen: incision(s):No., soft, non-tender, no masses,  no organomegaly Extremities: extremities normal, atraumatic, no cyanosis or edema Skin: Skin color, texture, turgor normal. No rashes or lesions Lymph nodes: Cervical, supraclavicular, and axillary nodes normal. No abnormal inguinal nodes palpated Neurologic: Grossly normal  Pelvic: External genitalia:  no lesions              Urethra:  normal appearing urethra with no masses, tenderness or lesions              Bartholins and Skenes: normal                 Vagina: normal appearing vagina with normal color and discharge, no lesions              Cervix: Dark area of mucosa on cervix.  (Prior pap normal.) Complete cervical prolapse.              Bimanual Exam:  Uterus:  enlarged, 14 weeks size.  Feels like uterus fills the pelvis.              Adnexa: no mass, fullness, tenderness              Rectal exam: Yes.  .  Confirms.              Anus:  normal sphincter tone, no lesions  Chaperone was present for exam.  ASSESSMENT  Complete uterovaginal prolapse. Uterine enlargement due to fibroids.   Cervical elongation.  Vaginal lesion (discoloration of mucosa). Normal pap and negative HR HPV Urinary stress incontinence.  Urinary retention.  Elevated HgbA1C consistent with diabetes. No current tx.  Recent rectal bleeding episode.  Mammogram overdue.   PLAN  I have had a comprehensive discussion with the patient regarding prolapse and urinary incontinence and surgical  correction including an abdominal salcrocolpopexy, Halban's culdoplasty, anterior and posterior colporrhaphy, TVT Exact midurethral sling and cystoscopy performed at the time of concurrent total abdominal hysterectomy and bilateral salpingo-oophorectomy performed by Dr. Talbert Nan.     We discussed benefits and risks of surgery which include but are not limited to bleeding, infection, damage to surrounding organs, ureteral damage, vaginal pain with intercourse, permanent mesh use which may cause erosion and exposure in the vagina, urethra, bladder or ureters, dyspareunia, slower voiding and urinary retention, possible need for prolonged  catheterization and/or self catheterization, de novo overactive bladder symptoms, reoperation, recurrence of prolapse and incontinence,  DVT, PE, death, and reaction to anesthesia.    I have discussed surgical expectations regarding the procedures and success rates, outcomes, and recovery.    Patient expresses desire to proceed with surgery.   Patient has a follow up appointment with her PCP this week for blood sugar re-evaluation.  My expectation is pharmacologic treatment of her diabetes.  Patient understands that uncontrolled diabetes is a risk factor for poor healing and complications with surgery.  Our office will contact her PCP office to be sure she has clearance for surgery and that they understand our goals.   We will schedule a mammogram for the patient.   We will get her records from her PCP regarding recent evaluation for the isolated episode of rectal bleeding with a bowel movement.   Patient has a follow up appointment with Dr. Talbert Nan next week.   An After Visit Summary was printed and given to the patient.  ___25___ minutes face to face time of which over 50% was spent in counseling.

## 2015-10-22 NOTE — Telephone Encounter (Signed)
Patient in office today for consult with Dr Quincy Simmonds. Surgery instruction sheet reviewed and printed copy given to patient, see copy scanned to chart. Instructed patient to call after PCP appointment with update.  Routing to provider for final review. Patient agreeable to disposition. Will close encounter.

## 2015-10-25 ENCOUNTER — Encounter: Payer: Self-pay | Admitting: Obstetrics and Gynecology

## 2015-10-25 ENCOUNTER — Ambulatory Visit (INDEPENDENT_AMBULATORY_CARE_PROVIDER_SITE_OTHER): Payer: BLUE CROSS/BLUE SHIELD | Admitting: Obstetrics and Gynecology

## 2015-10-25 VITALS — BP 132/80 | HR 58 | Resp 15 | Wt 186.0 lb

## 2015-10-25 DIAGNOSIS — D259 Leiomyoma of uterus, unspecified: Secondary | ICD-10-CM

## 2015-10-25 DIAGNOSIS — N393 Stress incontinence (female) (male): Secondary | ICD-10-CM

## 2015-10-25 DIAGNOSIS — N819 Female genital prolapse, unspecified: Secondary | ICD-10-CM

## 2015-10-25 NOTE — Progress Notes (Signed)
Patient ID: Heather Leach, female   DOB: [birth year], 60 y.o.   MRN: [id number] GYNECOLOGY  VISIT   HPI: 60 y.o.   Single  African American  female   [id number] with Patient's last menstrual period was 06/27/2011 (approximate).   here for a surgery consult. She is scheduled for a total abdominal hysterectomy with bilateral salpingo-oophorectomy on 11/16/15.  Dr Quincy Simmonds will be performing a sacrocolpopexy, A&P repair and TVT at the same surgery.The patient has a fibroid uterus, genital prolapse, and urinary incontinence. She had an episode of PMP bleeding, benign endometrial biopsy. Recent normal pap. She has a h/o pre-diabetes, her last HgbA1C was 7.1. She has been working on diet and exercise since then. She is seeing her primary this week for f/u and surgical clearance.  No more vaginal bleeding.  She has had labile BP in the past, never on medication.  GYNECOLOGIC HISTORY: Patient's last menstrual period was 06/27/2011 (approximate). Contraception:postmenopause  Menopausal hormone therapy: none         OB History    Gravida Para Term Preterm AB TAB SAB Ectopic Multiple Living   2 1 1  1  0    1         Patient Active Problem List   Diagnosis Date Noted  . Prediabetes   . Essential hypertension 08/19/2015  . Uterine prolapse 08/19/2015  . Syncope and collapse 08/11/2015  . Hypokalemia 08/11/2015  . Orthostatic hypotension 08/11/2015  . Vertigo 08/11/2015  . BMI 31.0-31.9,adult 06/12/2015  . Elevated blood pressure 06/12/2015  . Tinea corporis 06/12/2015  . Pre-diabetes 06/12/2015  . 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 Urinary frequency 10/28/2014  . Urinary incontinence 10/28/2014  . Vaginal itching 10/28/2014  . Hyperlipidemia 05/18/2010  . OVERWEIGHT 05/18/2010  . PRECORDIAL PAIN 05/18/2010  . ABNORMAL STRESS ELECTROCARDIOGRAM 05/18/2010    Past Medical History  Diagnosis Date  . Hyperlipidemia   . 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 Urinary frequency 10/28/2014  . Urinary  incontinence 10/28/2014  . Herpes simplex virus (HSV) infection   . Vaginal itching 10/28/2014  . Fibroid   . Vertigo   . Prediabetes     Past Surgical History  Procedure Laterality Date  . Tubal ligation    . Breast biopsy    PPTL  Current Outpatient Prescriptions  Medication Sig Dispense Refill  . meclizine (ANTIVERT) 25 MG tablet Take 25 mg by mouth 3 (three) times daily as needed for dizziness. Reported on 08/30/2015     No current facility-administered medications for this visit.     ALLERGIES: Review of patient's allergies indicates no known allergies.  Family History  Problem Relation Age of Onset  . Heart failure Mother   . Diabetes Mother   . Aneurysm Father   . Diabetes Maternal Grandmother   . Diabetes Other     mom's siblings, maternal cousins    Social History   Social History  . Marital Status: Single    Spouse Name: N/A  . Number of Children: N/A  . Years of Education: N/A   Occupational History  . Works at [employer]  . Smoking status: Never Smoker   . Smokeless tobacco: Never Used  . Alcohol Use: No  . Drug Use: No  . Sexual Activity: No     Comment: tubal/postmenopausal   Other Topics Concern  . Not on file   Social History Narrative   Single    Review of Systems  Constitutional: Negative.  HENT: Negative.   Eyes: Negative.   Respiratory: Negative.   Cardiovascular: Negative.   Gastrointestinal: Negative.   Genitourinary: Negative.   Musculoskeletal: Negative.   Skin: Negative.   Neurological: Negative.   Endo/Heme/Allergies: Negative.   Psychiatric/Behavioral: Negative.     PHYSICAL EXAMINATION:    BP 132/80 mmHg  Pulse 58  Resp 15  Wt 186 lb (84.369 kg)  LMP 06/27/2011 (Approximate)    General appearance: alert, cooperative and appears stated age Neck: no adenopathy, supple, symmetrical, trachea midline and thyroid normal to inspection and palpation Heart: regular rate and rhythm Lungs:  CTAB Abdomen: soft, non-tender; bowel sounds normal; no masses,  no organomegaly Lungs: CTAB Extremities: normal, atraumatic, no cyanosis Skin: normal color, texture and turgor, no rashes or lesions Lymph: normal cervical supraclavicular and inguinal nodes Neurologic: grossly normal    ASSESSMENT Fibroid uterus, genital prolapse, urinary incontinence.  The patient has been evaluated for PMP bleeding, benign endometrial biopsy. She had a possible endometrial polyp vs submucosal myoma on ultrasound Diabetes, HgbA1C from 2/17 was 7.1. She has been working on diet and exercise    PLAN Plan: TAH/BSO, Dr Quincy Simmonds to perform sacrocolpopexy, A&P repair, TVT The patient is aware of her options for treatment, including pessary, TLH with vaginal repair of prolapse and abdominal repair. She wishes to proceed with the above combined repair Discussed risks of surgery, including but not limited to: bleeding, infection, damage to bowel, bladder, vessels or ureters She has a f/u appointment with her primary later this week. We discussed the importance of good control of her diabetes to decrease her surgical risk Medical clearance with her primary        An After Visit Summary was printed and given to the patient.

## 2015-10-27 ENCOUNTER — Encounter (INDEPENDENT_AMBULATORY_CARE_PROVIDER_SITE_OTHER): Payer: Self-pay

## 2015-10-27 ENCOUNTER — Ambulatory Visit (HOSPITAL_COMMUNITY)
Admission: RE | Admit: 2015-10-27 | Discharge: 2015-10-27 | Disposition: A | Discharge status: Home / Self Care | Payer: BLUE CROSS/BLUE SHIELD | Source: Ambulatory Visit | Attending: Obstetrics and Gynecology | Admitting: Obstetrics and Gynecology

## 2015-10-27 DIAGNOSIS — Z1231 Encounter for screening mammogram for malignant neoplasm of breast: Secondary | ICD-10-CM | POA: Insufficient documentation

## 2015-10-28 ENCOUNTER — Encounter: Payer: Self-pay | Admitting: Family Medicine

## 2015-10-28 ENCOUNTER — Ambulatory Visit (INDEPENDENT_AMBULATORY_CARE_PROVIDER_SITE_OTHER): Payer: BLUE CROSS/BLUE SHIELD

## 2015-10-28 ENCOUNTER — Ambulatory Visit (INDEPENDENT_AMBULATORY_CARE_PROVIDER_SITE_OTHER): Payer: BLUE CROSS/BLUE SHIELD | Admitting: Family Medicine

## 2015-10-28 VITALS — BP 142/84 | HR 46 | Temp 97.7°F | Ht 65.5 in | Wt 184.0 lb

## 2015-10-28 DIAGNOSIS — Z01818 Encounter for other preprocedural examination: Secondary | ICD-10-CM

## 2015-10-28 DIAGNOSIS — I10 Essential (primary) hypertension: Secondary | ICD-10-CM

## 2015-10-28 DIAGNOSIS — E1122 Type 2 diabetes mellitus with diabetic chronic kidney disease: Secondary | ICD-10-CM

## 2015-10-28 DIAGNOSIS — Z0000 Encounter for general adult medical examination without abnormal findings: Secondary | ICD-10-CM | POA: Diagnosis not present

## 2015-10-28 DIAGNOSIS — N183 Chronic kidney disease, stage 3 (moderate): Secondary | ICD-10-CM

## 2015-10-28 LAB — BAYER DCA HB A1C WAIVED: HB A1C: 5.7 % (ref <7.0)

## 2015-10-28 NOTE — Progress Notes (Signed)
   HPI  Patient presents today here today for preoperative evaluation in 3 month follow-up.  Patient was previously diagnosed pre-diabetes, her A1c was 6.1 in December, 7.1 in February. She has stopped drinking sodas.  She is planning to have hysterectomy and bladder reconstruction surgery in about 3 weeks.  Easily climb a flight of stairs without chest pain or shortness of breath.  He denies chest pain, headache, shortness of breath, dyspnea, palpitations.   PMH: Smoking status noted ROS: Per HPI  Objective: BP 142/84 mmHg  Pulse 46  Temp(Src) 97.7 F (36.5 C) (Oral)  Ht 5' 5.5" (1.664 m)  Wt 184 lb (83.462 kg)  BMI 30.14 kg/m2  LMP 06/27/2011 (Approximate) Gen: NAD, alert, cooperative with exam HEENT: NCAT CV: RRR, good S1/S2, no murmur Resp: CTABL, no wheezes, non-labored Abd: Soft, slight tenderness to palpation in the left lower quadrant, no guarding, positive bowel sounds Ext: No edema, warm Neuro: Alert and oriented, No gross deficits  Assessment and plan:  # Preoperative clearance Slightly more than average risk given chronic kidney disease and diabetes She can easily perform 4 mets of exercise Cleared for surgery  # Type 2 diabetes Diet controlled Discussed with her getting appropriate diabetic education. Decreased GFR, would be cautious with starting metformin Repeat labs today including A1c, slightly before the 3 months  # Hypertension Borderline blood pressure, also diet controlled Previous history of orthostatic hypotension so would not treat very aggressively to start with,  especially considering surgery coming up  # CKD stage III Very slightly less than GFR 60 Cast avoiding NSAIDs and getting plenty of fluids Likely should start ACE inhibitor at very low-dose with mild hypertension as well Deferring starting new medication for now given recent prostatic hypotension and plan surgery  HCM Hep C, HIV, C scope referral placed (advised to request  date later in summer after surgery) mammo 1 day ago    Orders Placed This Encounter  Procedures  . DG Chest 2 View    Standing Status: Future     Number of Occurrences:      Standing Expiration Date: 12/27/2016    Order Specific Question:  Reason for Exam (SYMPTOM  OR DIAGNOSIS REQUIRED)    Answer:  previous atelectasis while admitted, pre-op    Order Specific Question:  Is the patient pregnant?    Answer:  No    Order Specific Question:  Preferred imaging location?    Answer:  External  . CMP14+EGFR  . CBC with Differential  . Bayer DCA Hb A1c Waived  . HIV antibody (with reflex)  . Hepatitis C antibody  . Ambulatory referral to Gastroenterology    Referral Priority:  Routine    Referral Type:  Consultation    Referral Reason:  Specialty Services Required    Number of Visits Requested:  [place], MD [facility] 10/28/2015, 9:15 AM

## 2015-10-28 NOTE — Patient Instructions (Addendum)
Great to meet you!  We will call with labs within 1 week  To protect your kidneys: Avoid NSAIDs (aleve and ibuprofen are the most common ones), Tylenol is ok (it is not an NSAID) Drink plenty of fluids  Make an appointment to see our clinical pharmacist for diabetic education  You will be called to schedule a colonoscopy, just let them know when you are ready to do it.   Come back in 3 months

## 2015-10-29 ENCOUNTER — Other Ambulatory Visit: Payer: Self-pay | Admitting: Obstetrics and Gynecology

## 2015-10-29 DIAGNOSIS — R928 Other abnormal and inconclusive findings on diagnostic imaging of breast: Secondary | ICD-10-CM

## 2015-10-29 LAB — CBC WITH DIFFERENTIAL/PLATELET
BASOS: 1 %
Basophils Absolute: 0 10*3/uL (ref 0.0–0.2)
EOS (ABSOLUTE): 0.2 10*3/uL (ref 0.0–0.4)
EOS: 3 %
Hematocrit: 41.3 % (ref 34.0–46.6)
Hemoglobin: 13.1 g/dL (ref 11.1–15.9)
IMMATURE GRANS (ABS): 0 10*3/uL (ref 0.0–0.1)
IMMATURE GRANULOCYTES: 0 %
LYMPHS: 62 %
Lymphocytes Absolute: 4 10*3/uL — ABNORMAL HIGH (ref 0.7–3.1)
MCH: 26.8 pg (ref 26.6–33.0)
MCHC: 31.7 g/dL (ref 31.5–35.7)
MCV: 85 fL (ref 79–97)
MONOS ABS: 0.3 10*3/uL (ref 0.1–0.9)
Monocytes: 5 %
NEUTROS PCT: 29 %
Neutrophils Absolute: 1.8 10*3/uL (ref 1.4–7.0)
PLATELETS: 220 10*3/uL (ref 150–379)
RBC: 4.89 x10E6/uL (ref 3.77–5.28)
RDW: 16.2 % — ABNORMAL HIGH (ref 12.3–15.4)
WBC: 6.3 10*3/uL (ref 3.4–10.8)

## 2015-10-29 LAB — CMP14+EGFR
ALT: 10 IU/L (ref 0–32)
AST: 12 IU/L (ref 0–40)
Albumin/Globulin Ratio: 1.3 (ref 1.2–2.2)
Albumin: 4.2 g/dL (ref 3.5–5.5)
Alkaline Phosphatase: 59 IU/L (ref 39–117)
BUN/Creatinine Ratio: 10 (ref 9–23)
BUN: 16 mg/dL (ref 6–24)
Bilirubin Total: 0.7 mg/dL (ref 0.0–1.2)
CALCIUM: 10.1 mg/dL (ref 8.7–10.2)
CO2: 24 mmol/L (ref 18–29)
CREATININE: 1.57 mg/dL — AB (ref 0.57–1.00)
Chloride: 105 mmol/L (ref 96–106)
GFR calc Af Amer: 42 mL/min/{1.73_m2} — ABNORMAL LOW (ref >59)
GFR, EST NON AFRICAN AMERICAN: 36 mL/min/{1.73_m2} — AB (ref >59)
GLOBULIN, TOTAL: 3.2 g/dL (ref 1.5–4.5)
Glucose: 100 mg/dL — ABNORMAL HIGH (ref 65–99)
Potassium: 4.2 mmol/L (ref 3.5–5.2)
SODIUM: 144 mmol/L (ref 134–144)
Total Protein: 7.4 g/dL (ref 6.0–8.5)

## 2015-10-29 LAB — HIV ANTIBODY (ROUTINE TESTING W REFLEX): HIV SCREEN 4TH GENERATION: NONREACTIVE

## 2015-10-29 LAB — HEPATITIS C ANTIBODY

## 2015-11-08 NOTE — H&P (Signed)
Expand All Collapse All   Patient ID: Heather Leach, female DOB: [birth year], 60 y.o. MRN: [id number] GYNECOLOGY VISIT  HPI: 60 y.o. Single African American female  [id number] with Patient's last menstrual period was 06/27/2011 (approximate).  here for a surgery consult. She is scheduled for a total abdominal hysterectomy with bilateral salpingo-oophorectomy on 11/16/15. Dr Quincy Simmonds will be performing a sacrocolpopexy, A&P repair and TVT at the same surgery.The patient has a fibroid uterus, genital prolapse, and urinary incontinence. She had an episode of PMP bleeding, benign endometrial biopsy. Recent normal pap. She has a h/o pre-diabetes, her last HgbA1C was 7.1. She has been working on diet and exercise since then. She is seeing her primary this week for f/u and surgical clearance.  No more vaginal bleeding.  She has had labile BP in the past, never on medication.  GYNECOLOGIC HISTORY: Patient's last menstrual period was 06/27/2011 (approximate). Contraception:postmenopause  Menopausal hormone therapy: none    OB History    Gravida Para Term Preterm AB TAB SAB Ectopic Multiple Living   2 1 1  1  0    1       Patient Active Problem List   Diagnosis Date Noted  . Prediabetes   . Essential hypertension 08/19/2015  . Uterine prolapse 08/19/2015  . Syncope and collapse 08/11/2015  . Hypokalemia 08/11/2015  . Orthostatic hypotension 08/11/2015  . Vertigo 08/11/2015  . BMI 31.0-31.9,adult 06/12/2015  . Elevated blood pressure 06/12/2015  . Tinea corporis 06/12/2015  . Pre-diabetes 06/12/2015  . 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 Urinary frequency 10/28/2014  . Urinary incontinence 10/28/2014  . Vaginal itching 10/28/2014  . Hyperlipidemia 05/18/2010  . OVERWEIGHT 05/18/2010  . PRECORDIAL PAIN 05/18/2010  . ABNORMAL STRESS ELECTROCARDIOGRAM 05/18/2010    Past Medical  History  Diagnosis Date  . Hyperlipidemia   . 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 Urinary frequency 10/28/2014  . Urinary incontinence 10/28/2014  . Herpes simplex virus (HSV) infection   . Vaginal itching 10/28/2014  . Fibroid   . Vertigo   . Prediabetes     Past Surgical History  Procedure Laterality Date  . Tubal ligation    . Breast biopsy    PPTL  Current Outpatient Prescriptions  Medication Sig Dispense Refill  . meclizine (ANTIVERT) 25 MG tablet Take 25 mg by mouth 3 (three) times daily as needed for dizziness. Reported on 08/30/2015     No current facility-administered medications for this visit.     ALLERGIES: Review of patient's allergies indicates no known allergies.  Family History  Problem Relation Age of Onset  . Heart failure Mother   . Diabetes Mother   . Aneurysm Father   . Diabetes Maternal Grandmother   . Diabetes Other     mom's siblings, maternal cousins    Social History   Social History  . Marital Status: Single    Spouse Name: N/A  . Number of Children: N/A  . Years of Education: N/A   Occupational History  . Works at [employer]  . Smoking status: Never Smoker   . Smokeless tobacco: Never Used  . Alcohol Use: No  . Drug Use: No  . Sexual Activity: No     Comment: tubal/postmenopausal   Other Topics Concern  . Not on file   Social History Narrative   Single    Review of Systems  Constitutional: Negative.  HENT: Negative.  Eyes: Negative.  Respiratory: Negative.  Cardiovascular: Negative.  Gastrointestinal: Negative.  Genitourinary: Negative.  Musculoskeletal: Negative.  Skin: Negative.  Neurological: Negative.  Endo/Heme/Allergies: Negative.  Psychiatric/Behavioral: Negative.    PHYSICAL EXAMINATION:   BP 132/80 mmHg  Pulse 58  Resp  15  Wt 186 lb (84.369 kg)  LMP 06/27/2011 (Approximate)  General appearance: alert, cooperative and appears stated age Neck: no adenopathy, supple, symmetrical, trachea midline and thyroid normal to inspection and palpation Heart: regular rate and rhythm Lungs: CTAB Abdomen: soft, non-tender; bowel sounds normal; no masses, no organomegaly Lungs: CTAB Extremities: normal, atraumatic, no cyanosis Skin: normal color, texture and turgor, no rashes or lesions Lymph: normal cervical supraclavicular and inguinal nodes Neurologic: grossly normal    ASSESSMENT Fibroid uterus, genital prolapse, urinary incontinence.  The patient has been evaluated for PMP bleeding, benign endometrial biopsy. She had a possible endometrial polyp vs submucosal myoma on ultrasound Diabetes, HgbA1C from 2/17 was 7.1. She has been working on diet and exercise    PLAN Plan: TAH/BSO, Dr Quincy Simmonds to perform sacrocolpopexy, A&P repair, TVT The patient is aware of her options for treatment, including pessary, TLH with vaginal repair of prolapse and abdominal repair. She wishes to proceed with the above combined repair Discussed risks of surgery, including but not limited to: bleeding, infection, damage to bowel, bladder, vessels or ureters She has a f/u appointment with her primary later this week. We discussed the importance of good control of her diabetes to decrease her surgical risk Medical clearance with her primary           Addendum: since her pre-op visit the patient had medical clearance with her primary MD.  She has been diagnosed with Type II diabetes, diet controlled. Recent Hgb A1C of 5.7.  She also has Stage 3 CRD, creatinine of 1.57, GFR of 42. He has instructed her to avoid NSAID's.

## 2015-11-09 ENCOUNTER — Ambulatory Visit
Admission: RE | Admit: 2015-11-09 | Discharge: 2015-11-09 | Disposition: A | Discharge status: Home / Self Care | Payer: BLUE CROSS/BLUE SHIELD | Source: Ambulatory Visit | Attending: Obstetrics and Gynecology | Admitting: Obstetrics and Gynecology

## 2015-11-09 DIAGNOSIS — R928 Other abnormal and inconclusive findings on diagnostic imaging of breast: Secondary | ICD-10-CM

## 2015-11-09 DIAGNOSIS — R922 Inconclusive mammogram: Secondary | ICD-10-CM | POA: Diagnosis not present

## 2015-11-09 NOTE — Patient Instructions (Signed)
Your procedure is scheduled on:  Tuesday, Nov 16, 2015  Enter through the Main Entrance of [facility] at: 6:00 AM  Pick up the phone at the desk and dial [phone].  Call this number if you have problems the morning of surgery: [phone].  Remember: Do NOT eat food or drink after:  Midnight Monday  Take these medicines the morning of surgery with a SIP OF WATER:  None  Do NOT wear jewelry (body piercing), metal hair clips/bobby pins, make-up, or nail polish. Do NOT wear lotions, powders, or perfumes.  You may wear deodorant. Do NOT shave for 48 hours prior to surgery. Do NOT bring valuables to the hospital. Contacts, dentures, or bridgework may not be worn into surgery.  Leave suitcase in car.  After surgery it may be brought to your room.  For patients admitted to the hospital, checkout time is 11:00 AM the day of discharge.

## 2015-11-10 ENCOUNTER — Other Ambulatory Visit: Payer: Self-pay

## 2015-11-11 ENCOUNTER — Encounter (HOSPITAL_COMMUNITY)
Admission: RE | Admit: 2015-11-11 | Discharge: 2015-11-11 | Disposition: A | Discharge status: Home / Self Care | Payer: BLUE CROSS/BLUE SHIELD | Source: Ambulatory Visit | Attending: Obstetrics and Gynecology | Admitting: Obstetrics and Gynecology

## 2015-11-11 ENCOUNTER — Encounter (HOSPITAL_COMMUNITY): Payer: Self-pay

## 2015-11-11 DIAGNOSIS — R32 Unspecified urinary incontinence: Secondary | ICD-10-CM | POA: Insufficient documentation

## 2015-11-11 DIAGNOSIS — D259 Leiomyoma of uterus, unspecified: Secondary | ICD-10-CM | POA: Diagnosis not present

## 2015-11-11 DIAGNOSIS — Z01812 Encounter for preprocedural laboratory examination: Secondary | ICD-10-CM | POA: Insufficient documentation

## 2015-11-11 DIAGNOSIS — I10 Essential (primary) hypertension: Secondary | ICD-10-CM | POA: Diagnosis not present

## 2015-11-11 DIAGNOSIS — N819 Female genital prolapse, unspecified: Secondary | ICD-10-CM | POA: Diagnosis not present

## 2015-11-11 DIAGNOSIS — E119 Type 2 diabetes mellitus without complications: Secondary | ICD-10-CM | POA: Insufficient documentation

## 2015-11-11 HISTORY — DX: Type 2 diabetes mellitus without complications: E11.9

## 2015-11-11 LAB — TYPE AND SCREEN
ABO/RH(D): O POS
Antibody Screen: NEGATIVE

## 2015-11-11 LAB — CBC
HEMATOCRIT: 40.2 % (ref 36.0–46.0)
HEMOGLOBIN: 13.4 g/dL (ref 12.0–15.0)
MCH: 27.5 pg (ref 26.0–34.0)
MCHC: 33.3 g/dL (ref 30.0–36.0)
MCV: 82.4 fL (ref 78.0–100.0)
Platelets: 197 10*3/uL (ref 150–400)
RBC: 4.88 MIL/uL (ref 3.87–5.11)
RDW: 14.9 % (ref 11.5–15.5)
WBC: 7.9 10*3/uL (ref 4.0–10.5)

## 2015-11-11 LAB — ABO/RH: ABO/RH(D): O POS

## 2015-11-15 MED ORDER — ENOXAPARIN SODIUM 40 MG/0.4ML ~~LOC~~ SOLN
40.0000 mg | SUBCUTANEOUS | Status: AC
  Administered 2015-11-16: 40 mg via SUBCUTANEOUS
  Filled 2015-11-15: qty 0.4

## 2015-11-15 MED ORDER — DEXTROSE 5 % IV SOLN
2.0000 g | INTRAVENOUS | Status: AC
  Administered 2015-11-16: 2 g via INTRAVENOUS
  Filled 2015-11-15: qty 2

## 2015-11-15 MED ORDER — ENOXAPARIN SODIUM 40 MG/0.4ML ~~LOC~~ SOLN
40.0000 mg | SUBCUTANEOUS | Status: DC
  Filled 2015-11-15: qty 0.4

## 2015-11-15 NOTE — Anesthesia Preprocedure Evaluation (Addendum)
Anesthesia Evaluation  Patient identified by MRN, date of birth, ID band Patient awake    Reviewed: Allergy & Precautions, NPO status , Patient's Chart, lab work & pertinent test results  History of Anesthesia Complications Negative for: history of anesthetic complications  Airway Mallampati: II  TM Distance: >3 FB Neck ROM: Full    Dental  (+) Teeth Intact, Dental Advisory Given   Pulmonary    Pulmonary exam normal        Cardiovascular hypertension, Normal cardiovascular exam     Neuro/Psych negative neurological ROS  negative psych ROS   GI/Hepatic negative GI ROS, Neg liver ROS,   Endo/Other  diabetes  Renal/GU negative Renal ROS     Musculoskeletal negative musculoskeletal ROS (+)   Abdominal   Peds  Hematology   Anesthesia Other Findings   Reproductive/Obstetrics                            Anesthesia Physical Anesthesia Plan  ASA: II  Anesthesia Plan: General   Post-op Pain Management:    Induction: Intravenous  Airway Management Planned: Oral ETT  Additional Equipment:   Intra-op Plan:   Post-operative Plan: Extubation in OR  Informed Consent: I have reviewed the patients History and Physical, chart, labs and discussed the procedure including the risks, benefits and alternatives for the proposed anesthesia with the patient or authorized representative who has indicated his/her understanding and acceptance.   Dental advisory given  Plan Discussed with: Anesthesiologist  Anesthesia Plan Comments:        Anesthesia Quick Evaluation

## 2015-11-15 NOTE — H&P (Signed)
Progress Notes      Nunzio Cobbs, MD at 10/22/2015 9:26 AM     Status: Signed       Expand All Collapse All   Patient ID: Heather Leach, female DOB: [birth year], 60 y.o. MRN: [id number] GYNECOLOGY VISIT  HPI: 60 y.o. Single African American female  [id number] with Patient's last menstrual period was 06/27/2011 (approximate).  here for surgical consult.   Has complete uterovaginal prolapse and a fibroid uterus.  Feels like the prolapse is progressive. Pelvic ultrasound in March confirmed multifibroid uterus with endometrial thickening and polyp versus submucous fibroid. Normal ovaries.  EMB with Dr. Talbert Nan - benign endometrium.  Has leakage of urine with lifting and pulling.  Denies fecal incontinence and constipation.  Had urinary retention of 270 cc by office I and O cath with Dr. Talbert Nan.   Pessary was uncomfortable in the past.   States her blood sugar is not monitored.  Will see a PCP next week.  Hgb A1C was 7.1 two months ago.  Patient is not on medication for her glucose control.  She has wanted to do dietary modification.   Had an episode 3 weeks ago of blood with a bowel movement when she was constipated. She thinks she strained.  Saw her PCP and was told everything was OK but that she will need a colonoscopy after her surgery is done.  Had a stool card test which was negative per patient.  Has never had rectal bleeding in the past or since.  No know hemorrhoids.   PCP - Dr. Evette Doffing in [place], [place]. [place].  GYNECOLOGIC HISTORY: Patient's last menstrual period was 06/27/2011 (approximate). Contraception: Tubal/postmenopausal Menopausal hormone therapy: none Last mammogram: 11-03-11 dense/Neg/BiRads1:[facility] Last pap smear: 10-28-14 Neg and negative HR HPV.     OB History    Gravida Para Term Preterm AB TAB SAB Ectopic Multiple Living   2 1 1  1  0    1        Patient Active Problem List   Diagnosis Date Noted  . Prediabetes   . Essential hypertension 08/19/2015  . Uterine prolapse 08/19/2015  . Syncope and collapse 08/11/2015  . Hypokalemia 08/11/2015  . Orthostatic hypotension 08/11/2015  . Vertigo 08/11/2015  . BMI 31.0-31.9,adult 06/12/2015  . Elevated blood pressure 06/12/2015  . Tinea corporis 06/12/2015  . Pre-diabetes 06/12/2015  . 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 Urinary frequency 10/28/2014  . Urinary incontinence 10/28/2014  . Vaginal itching 10/28/2014  . Hyperlipidemia 05/18/2010  . OVERWEIGHT 05/18/2010  . PRECORDIAL PAIN 05/18/2010  . ABNORMAL STRESS ELECTROCARDIOGRAM 05/18/2010    Past Medical History  Diagnosis Date  . Hyperlipidemia   . 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 Urinary frequency 10/28/2014  . Urinary incontinence 10/28/2014  . Herpes simplex virus (HSV) infection   . Vaginal itching 10/28/2014  . Fibroid   . Vertigo   . Prediabetes     Past Surgical History  Procedure Laterality Date  . Tubal ligation    . Breast biopsy      Current Outpatient Prescriptions  Medication Sig Dispense Refill  . meclizine (ANTIVERT) 25 MG tablet Take 25 mg by mouth 3 (three) times daily as needed for dizziness. Reported on 08/30/2015     No current facility-administered medications for this visit.     ALLERGIES: Review of patient's allergies indicates no known allergies.  Family History  Problem Relation Age of Onset  . Heart failure Mother   . Diabetes Mother   .  Aneurysm Father   . Diabetes Maternal Grandmother   . Diabetes Other     mom's siblings, maternal cousins    Social History   Social History  . Marital Status: Single    Spouse Name: N/A  . Number of Children: N/A  . Years of Education: N/A   Occupational  History  . Works at [employer]  . Smoking status: Never Smoker   . Smokeless tobacco: Never Used  . Alcohol Use: No  . Drug Use: No  . Sexual Activity: No     Comment: tubal/postmenopausal   Other Topics Concern  . Not on file   Social History Narrative   Single    ROS: Pertinent items are noted in HPI.  PHYSICAL EXAMINATION:   BP 144/80 mmHg  Pulse 60  Ht 5' 5.5" (1.664 m)  Wt 184 lb 3.2 oz (83.553 kg)  BMI 30.18 kg/m2  LMP 06/27/2011 (Approximate)  General appearance: alert, cooperative and appears stated age Head: Normocephalic, without obvious abnormality, atraumatic Neck: no adenopathy, supple, symmetrical, trachea midline and thyroid normal to inspection and palpation Lungs: clear to auscultation bilaterally Heart: regular rate and rhythm Abdomen: incision(s):No., soft, non-tender, no masses, no organomegaly Extremities: extremities normal, atraumatic, no cyanosis or edema Skin: Skin color, texture, turgor normal. No rashes or lesions Lymph nodes: Cervical, supraclavicular, and axillary nodes normal. No abnormal inguinal nodes palpated Neurologic: Grossly normal  Pelvic: External genitalia: no lesions  Urethra: normal appearing urethra with no masses, tenderness or lesions  Bartholins and Skenes: normal   Vagina: normal appearing vagina with normal color and discharge, no lesions  Cervix: Dark area of mucosa on cervix. (Prior pap normal.) Complete cervical prolapse.   Bimanual Exam: Uterus: enlarged, 14 weeks size. Feels like uterus fills the pelvis.  Adnexa: no mass, fullness, tenderness  Rectal exam: Yes. . Confirms.  Anus: normal sphincter tone, no lesions  Chaperone was present for exam.  ASSESSMENT  Complete uterovaginal prolapse. Uterine enlargement due to fibroids.   Cervical elongation.  Vaginal lesion (discoloration of mucosa). Normal pap and negative HR HPV Urinary stress incontinence.  Urinary retention.  Elevated HgbA1C consistent with diabetes. No current tx.  Recent rectal bleeding episode.  Mammogram overdue.   PLAN  I have had a comprehensive discussion with the patient regarding prolapse and urinary incontinence and surgical correction including an abdominal salcrocolpopexy, Halban's culdoplasty, anterior and posterior colporrhaphy, TVT Exact midurethral sling and cystoscopy performed at the time of concurrent total abdominal hysterectomy and bilateral salpingo-oophorectomy performed by Dr. Talbert Nan.   We discussed benefits and risks of surgery which include but are not limited to bleeding, infection, damage to surrounding organs, ureteral damage, vaginal pain with intercourse, permanent mesh use which may cause erosion and exposure in the vagina, urethra, bladder or ureters, dyspareunia, slower voiding and urinary retention, possible need for prolonged catheterization and/or self catheterization, de novo overactive bladder symptoms, reoperation, recurrence of prolapse and incontinence, DVT, PE, death, and reaction to anesthesia.   I have discussed surgical expectations regarding the procedures and success rates, outcomes, and recovery.  Patient expresses desire to proceed with surgery.   Patient has a follow up appointment with her PCP this week for blood sugar re-evaluation. My expectation is pharmacologic treatment of her diabetes. Patient understands that uncontrolled diabetes is a risk factor for poor healing and complications with surgery. Our office will contact her PCP office to be sure she has clearance for surgery and that they understand our goals.  We will schedule a mammogram for the patient.   We will get her records from her PCP regarding recent evaluation for the isolated episode of rectal bleeding with a bowel  movement.   Patient has a follow up appointment with Dr. Talbert Nan next week.   An After Visit Summary was printed and given to the patient.  ___25___ minutes face to face time of which over 50% was spent in counseling.            Revision History       Date/Time User Action    > 10/23/2015 10:12 AM Nunzio Cobbs, MD Sign     10/22/2015 9:31 AM Lowella Fairy, CMA Sign at close encounter              Jasmine Awe, RN at 10/22/2015 10:40 AM     Status: Signed       Expand All Collapse All   Scheduled patient for bilateral screening mammogram while in office at [facility] on 10/27/2015 at 10:30 am. Patient is agreeable to date and time.

## 2015-11-16 ENCOUNTER — Encounter (HOSPITAL_COMMUNITY): Payer: Self-pay | Admitting: Anesthesiology

## 2015-11-16 ENCOUNTER — Inpatient Hospital Stay (HOSPITAL_COMMUNITY): Payer: BLUE CROSS/BLUE SHIELD | Admitting: Anesthesiology

## 2015-11-16 ENCOUNTER — Encounter (HOSPITAL_COMMUNITY)
Admission: RE | Disposition: A | Discharge status: Home / Self Care | Payer: Self-pay | Source: Ambulatory Visit | Attending: Obstetrics and Gynecology

## 2015-11-16 ENCOUNTER — Inpatient Hospital Stay (HOSPITAL_COMMUNITY)
Admission: RE | Admit: 2015-11-16 | Discharge: 2015-11-18 | DRG: 743 | Disposition: A | Discharge status: Home / Self Care | Payer: BLUE CROSS/BLUE SHIELD | Source: Ambulatory Visit | Attending: Obstetrics and Gynecology | Admitting: Obstetrics and Gynecology

## 2015-11-16 DIAGNOSIS — D259 Leiomyoma of uterus, unspecified: Secondary | ICD-10-CM | POA: Diagnosis not present

## 2015-11-16 DIAGNOSIS — Z9889 Other specified postprocedural states: Secondary | ICD-10-CM

## 2015-11-16 DIAGNOSIS — R32 Unspecified urinary incontinence: Secondary | ICD-10-CM | POA: Diagnosis not present

## 2015-11-16 DIAGNOSIS — N813 Complete uterovaginal prolapse: Secondary | ICD-10-CM | POA: Diagnosis not present

## 2015-11-16 DIAGNOSIS — D649 Anemia, unspecified: Secondary | ICD-10-CM | POA: Diagnosis not present

## 2015-11-16 DIAGNOSIS — N9989 Other postprocedural complications and disorders of genitourinary system: Secondary | ICD-10-CM | POA: Diagnosis present

## 2015-11-16 DIAGNOSIS — E663 Overweight: Secondary | ICD-10-CM | POA: Diagnosis not present

## 2015-11-16 DIAGNOSIS — I10 Essential (primary) hypertension: Secondary | ICD-10-CM | POA: Diagnosis present

## 2015-11-16 DIAGNOSIS — Z6831 Body mass index (BMI) 31.0-31.9, adult: Secondary | ICD-10-CM

## 2015-11-16 DIAGNOSIS — Z833 Family history of diabetes mellitus: Secondary | ICD-10-CM

## 2015-11-16 DIAGNOSIS — Z8249 Family history of ischemic heart disease and other diseases of the circulatory system: Secondary | ICD-10-CM | POA: Diagnosis not present

## 2015-11-16 DIAGNOSIS — N898 Other specified noninflammatory disorders of vagina: Secondary | ICD-10-CM | POA: Diagnosis present

## 2015-11-16 DIAGNOSIS — N393 Stress incontinence (female) (male): Secondary | ICD-10-CM | POA: Diagnosis not present

## 2015-11-16 DIAGNOSIS — E119 Type 2 diabetes mellitus without complications: Secondary | ICD-10-CM | POA: Diagnosis not present

## 2015-11-16 DIAGNOSIS — R938 Abnormal findings on diagnostic imaging of other specified body structures: Secondary | ICD-10-CM | POA: Diagnosis present

## 2015-11-16 DIAGNOSIS — Z90710 Acquired absence of both cervix and uterus: Secondary | ICD-10-CM

## 2015-11-16 DIAGNOSIS — N950 Postmenopausal bleeding: Secondary | ICD-10-CM | POA: Diagnosis not present

## 2015-11-16 DIAGNOSIS — Z9079 Acquired absence of other genital organ(s): Secondary | ICD-10-CM

## 2015-11-16 DIAGNOSIS — Z9851 Tubal ligation status: Secondary | ICD-10-CM | POA: Diagnosis not present

## 2015-11-16 DIAGNOSIS — R338 Other retention of urine: Secondary | ICD-10-CM | POA: Diagnosis present

## 2015-11-16 DIAGNOSIS — Z90722 Acquired absence of ovaries, bilateral: Secondary | ICD-10-CM

## 2015-11-16 HISTORY — PX: ANTERIOR AND POSTERIOR REPAIR: SHX5121

## 2015-11-16 HISTORY — PX: SALPINGOOPHORECTOMY: SHX82

## 2015-11-16 HISTORY — PX: BLADDER SUSPENSION: SHX72

## 2015-11-16 HISTORY — PX: ABDOMINAL SACROCOLPOPEXY: SHX1114

## 2015-11-16 HISTORY — PX: CYSTOSCOPY: SHX5120

## 2015-11-16 HISTORY — PX: ABDOMINAL HYSTERECTOMY: SHX81

## 2015-11-16 LAB — CBC
HEMATOCRIT: 36.3 % (ref 36.0–46.0)
HEMOGLOBIN: 12 g/dL (ref 12.0–15.0)
MCH: 27.3 pg (ref 26.0–34.0)
MCHC: 33.1 g/dL (ref 30.0–36.0)
MCV: 82.7 fL (ref 78.0–100.0)
PLATELETS: 174 10*3/uL (ref 150–400)
RBC: 4.39 MIL/uL (ref 3.87–5.11)
RDW: 14.6 % (ref 11.5–15.5)
WBC: 9.3 10*3/uL (ref 4.0–10.5)

## 2015-11-16 LAB — CREATININE, SERUM
CREATININE: 1.59 mg/dL — AB (ref 0.44–1.00)
GFR, EST AFRICAN AMERICAN: 40 mL/min — AB (ref >60)
GFR, EST NON AFRICAN AMERICAN: 35 mL/min — AB (ref >60)

## 2015-11-16 SURGERY — HYSTERECTOMY, ABDOMINAL
Anesthesia: General | Site: Vagina

## 2015-11-16 MED ORDER — HYDROMORPHONE HCL 1 MG/ML IJ SOLN
INTRAMUSCULAR | Status: AC
  Filled 2015-11-16: qty 1

## 2015-11-16 MED ORDER — HYDROMORPHONE HCL 1 MG/ML IJ SOLN
INTRAMUSCULAR | Status: DC | PRN
  Administered 2015-11-16: 1 mg via INTRAVENOUS

## 2015-11-16 MED ORDER — PROPOFOL 10 MG/ML IV BOLUS
INTRAVENOUS | Status: DC | PRN
  Administered 2015-11-16: 180 mg via INTRAVENOUS

## 2015-11-16 MED ORDER — GLYCOPYRROLATE 0.2 MG/ML IJ SOLN
INTRAMUSCULAR | Status: AC
  Filled 2015-11-16: qty 1

## 2015-11-16 MED ORDER — ENOXAPARIN SODIUM 30 MG/0.3ML ~~LOC~~ SOLN
30.0000 mg | SUBCUTANEOUS | Status: DC
  Filled 2015-11-16: qty 0.3

## 2015-11-16 MED ORDER — MENTHOL 3 MG MT LOZG: 1.0000 | LOZENGE | OROMUCOSAL | Status: DC | PRN

## 2015-11-16 MED ORDER — DEXAMETHASONE SODIUM PHOSPHATE 10 MG/ML IJ SOLN
INTRAMUSCULAR | Status: DC | PRN
  Administered 2015-11-16: 4 mg via INTRAVENOUS

## 2015-11-16 MED ORDER — ESTRADIOL 0.1 MG/GM VA CREA
TOPICAL_CREAM | VAGINAL | Status: AC
  Filled 2015-11-16: qty 42.5

## 2015-11-16 MED ORDER — EPHEDRINE 5 MG/ML INJ
INTRAVENOUS | Status: AC
  Filled 2015-11-16: qty 10

## 2015-11-16 MED ORDER — MIDAZOLAM HCL 2 MG/2ML IJ SOLN
INTRAMUSCULAR | Status: AC
  Filled 2015-11-16: qty 2

## 2015-11-16 MED ORDER — DIPHENHYDRAMINE HCL 50 MG/ML IJ SOLN: 12.5000 mg | Freq: Four times a day (QID) | INTRAMUSCULAR | Status: DC | PRN

## 2015-11-16 MED ORDER — PROPOFOL 10 MG/ML IV BOLUS
INTRAVENOUS | Status: AC
  Filled 2015-11-16: qty 20

## 2015-11-16 MED ORDER — DEXAMETHASONE SODIUM PHOSPHATE 4 MG/ML IJ SOLN
INTRAMUSCULAR | Status: AC
  Filled 2015-11-16: qty 1

## 2015-11-16 MED ORDER — FENTANYL CITRATE (PF) 100 MCG/2ML IJ SOLN
INTRAMUSCULAR | Status: DC | PRN
  Administered 2015-11-16: 50 ug via INTRAVENOUS
  Administered 2015-11-16: 25 ug via INTRAVENOUS
  Administered 2015-11-16: 50 ug via INTRAVENOUS
  Administered 2015-11-16: 25 ug via INTRAVENOUS
  Administered 2015-11-16 (×2): 50 ug via INTRAVENOUS

## 2015-11-16 MED ORDER — ENOXAPARIN SODIUM 40 MG/0.4ML ~~LOC~~ SOLN
40.0000 mg | SUBCUTANEOUS | Status: DC
  Administered 2015-11-17: 40 mg via SUBCUTANEOUS
  Filled 2015-11-16: qty 0.4

## 2015-11-16 MED ORDER — ONDANSETRON HCL 4 MG/2ML IJ SOLN
INTRAMUSCULAR | Status: DC | PRN
  Administered 2015-11-16: 4 mg via INTRAVENOUS

## 2015-11-16 MED ORDER — ROCURONIUM BROMIDE 100 MG/10ML IV SOLN
INTRAVENOUS | Status: AC
  Filled 2015-11-16: qty 1

## 2015-11-16 MED ORDER — OXYCODONE-ACETAMINOPHEN 5-325 MG PO TABS
1.0000 | ORAL_TABLET | ORAL | Status: DC | PRN
  Administered 2015-11-17 – 2015-11-18 (×4): 1 via ORAL
  Filled 2015-11-16 (×4): qty 1

## 2015-11-16 MED ORDER — MORPHINE SULFATE 2 MG/ML IV SOLN
INTRAVENOUS | Status: DC
  Administered 2015-11-16: 6 mg via INTRAVENOUS
  Administered 2015-11-16: 4 mg via INTRAVENOUS
  Administered 2015-11-16: 15:00:00 via INTRAVENOUS
  Administered 2015-11-17 (×2): 4 mg via INTRAVENOUS
  Administered 2015-11-17: 8 mg via INTRAVENOUS
  Filled 2015-11-16: qty 25

## 2015-11-16 MED ORDER — SCOPOLAMINE 1 MG/3DAYS TD PT72
MEDICATED_PATCH | TRANSDERMAL | Status: AC
  Filled 2015-11-16: qty 1

## 2015-11-16 MED ORDER — ACETAMINOPHEN 10 MG/ML IV SOLN
INTRAVENOUS | Status: DC | PRN
  Administered 2015-11-16: 1000 mg via INTRAVENOUS

## 2015-11-16 MED ORDER — ESTRADIOL 0.1 MG/GM VA CREA
TOPICAL_CREAM | VAGINAL | Status: DC | PRN
  Administered 2015-11-16: 1 via VAGINAL

## 2015-11-16 MED ORDER — ONDANSETRON HCL 4 MG/2ML IJ SOLN
INTRAMUSCULAR | Status: AC
  Filled 2015-11-16: qty 2

## 2015-11-16 MED ORDER — NEOSTIGMINE METHYLSULFATE 10 MG/10ML IV SOLN
INTRAVENOUS | Status: AC
  Filled 2015-11-16: qty 1

## 2015-11-16 MED ORDER — SCOPOLAMINE 1 MG/3DAYS TD PT72
MEDICATED_PATCH | TRANSDERMAL | Status: AC
  Administered 2015-11-16: 1.5 mg via TRANSDERMAL
  Filled 2015-11-16: qty 1

## 2015-11-16 MED ORDER — STERILE WATER FOR IRRIGATION IR SOLN
Status: DC | PRN
  Administered 2015-11-16: 1000 mL

## 2015-11-16 MED ORDER — SODIUM CHLORIDE 0.9% FLUSH: 9.0000 mL | INTRAVENOUS | Status: DC | PRN

## 2015-11-16 MED ORDER — MIDAZOLAM HCL 2 MG/2ML IJ SOLN
INTRAMUSCULAR | Status: DC | PRN
  Administered 2015-11-16: 2 mg via INTRAVENOUS

## 2015-11-16 MED ORDER — ROCURONIUM BROMIDE 100 MG/10ML IV SOLN
INTRAVENOUS | Status: DC | PRN
  Administered 2015-11-16: 10 mg via INTRAVENOUS
  Administered 2015-11-16: 5 mg via INTRAVENOUS
  Administered 2015-11-16: 60 mg via INTRAVENOUS
  Administered 2015-11-16: 10 mg via INTRAVENOUS

## 2015-11-16 MED ORDER — FENTANYL CITRATE (PF) 250 MCG/5ML IJ SOLN
INTRAMUSCULAR | Status: AC
  Filled 2015-11-16: qty 5

## 2015-11-16 MED ORDER — DIPHENHYDRAMINE HCL 12.5 MG/5ML PO ELIX
12.5000 mg | ORAL_SOLUTION | Freq: Four times a day (QID) | ORAL | Status: DC | PRN
Start: 2015-11-16 — End: 2015-11-18

## 2015-11-16 MED ORDER — DEXTROSE 10 % IV SOLN
INTRAVENOUS | Status: DC
  Filled 2015-11-16: qty 500

## 2015-11-16 MED ORDER — BUPIVACAINE LIPOSOME 1.3 % IJ SUSP
Freq: Once | INTRAMUSCULAR | Status: DC
  Filled 2015-11-16 (×2): qty 20

## 2015-11-16 MED ORDER — METHYLENE BLUE 1 % INJ SOLN
INTRAMUSCULAR | Status: AC
  Filled 2015-11-16: qty 10

## 2015-11-16 MED ORDER — EPHEDRINE SULFATE 50 MG/ML IJ SOLN
INTRAMUSCULAR | Status: DC | PRN
  Administered 2015-11-16 (×2): 10 mg via INTRAVENOUS

## 2015-11-16 MED ORDER — LIDOCAINE-EPINEPHRINE 1 %-1:100000 IJ SOLN
INTRAMUSCULAR | Status: DC | PRN
  Administered 2015-11-16: 12 mL

## 2015-11-16 MED ORDER — GLYCOPYRROLATE 0.2 MG/ML IJ SOLN
INTRAMUSCULAR | Status: AC
  Filled 2015-11-16: qty 2

## 2015-11-16 MED ORDER — ONDANSETRON HCL 4 MG/2ML IJ SOLN: 4.0000 mg | Freq: Four times a day (QID) | INTRAMUSCULAR | Status: DC | PRN

## 2015-11-16 MED ORDER — GLYCOPYRROLATE 0.2 MG/ML IJ SOLN
INTRAMUSCULAR | Status: DC | PRN
  Administered 2015-11-16 (×2): 0.2 mg via INTRAVENOUS

## 2015-11-16 MED ORDER — LACTATED RINGERS IV SOLN
INTRAVENOUS | Status: DC
  Administered 2015-11-16: 07:00:00 via INTRAVENOUS

## 2015-11-16 MED ORDER — LIDOCAINE HCL (CARDIAC) 20 MG/ML IV SOLN
INTRAVENOUS | Status: DC | PRN
  Administered 2015-11-16: 70 mg via INTRAVENOUS
  Administered 2015-11-16: 30 mg via INTRAVENOUS

## 2015-11-16 MED ORDER — LIDOCAINE-EPINEPHRINE 1 %-1:100000 IJ SOLN
INTRAMUSCULAR | Status: AC
  Filled 2015-11-16: qty 1

## 2015-11-16 MED ORDER — LIDOCAINE HCL (CARDIAC) 20 MG/ML IV SOLN
INTRAVENOUS | Status: AC
  Filled 2015-11-16: qty 5

## 2015-11-16 MED ORDER — ACETAMINOPHEN 10 MG/ML IV SOLN
1000.0000 mg | Freq: Once | INTRAVENOUS | Status: DC
  Filled 2015-11-16: qty 100

## 2015-11-16 MED ORDER — DEXTROSE 5 % IV SOLN
2.0000 g | Freq: Once | INTRAVENOUS | Status: AC
  Administered 2015-11-16: 2 g via INTRAVENOUS
  Filled 2015-11-16: qty 2

## 2015-11-16 MED ORDER — LACTATED RINGERS IV SOLN
INTRAVENOUS | Status: DC
  Administered 2015-11-16 – 2015-11-17 (×3): via INTRAVENOUS

## 2015-11-16 MED ORDER — SODIUM CHLORIDE 0.9 % IJ SOLN
INTRAMUSCULAR | Status: AC
  Filled 2015-11-16: qty 20

## 2015-11-16 MED ORDER — SUGAMMADEX SODIUM 200 MG/2ML IV SOLN
INTRAVENOUS | Status: AC
  Filled 2015-11-16: qty 2

## 2015-11-16 MED ORDER — SUGAMMADEX SODIUM 200 MG/2ML IV SOLN
INTRAVENOUS | Status: DC | PRN
  Administered 2015-11-16: 150 mg via INTRAVENOUS

## 2015-11-16 MED ORDER — BUPIVACAINE LIPOSOME 1.3 % IJ SUSP
INTRAMUSCULAR | Status: DC | PRN
  Administered 2015-11-16: 30 mL

## 2015-11-16 MED ORDER — NALOXONE HCL 0.4 MG/ML IJ SOLN: 0.4000 mg | INTRAMUSCULAR | Status: DC | PRN

## 2015-11-16 MED ORDER — KETOROLAC TROMETHAMINE 30 MG/ML IJ SOLN
INTRAMUSCULAR | Status: AC
  Filled 2015-11-16: qty 1

## 2015-11-16 MED ORDER — ONDANSETRON HCL 4 MG PO TABS: 4.0000 mg | ORAL_TABLET | Freq: Four times a day (QID) | ORAL | Status: DC | PRN

## 2015-11-16 MED ORDER — LACTATED RINGERS IV SOLN
INTRAVENOUS | Status: DC
  Administered 2015-11-16 (×2): via INTRAVENOUS
  Administered 2015-11-16: 125 mL/h via INTRAVENOUS
  Administered 2015-11-16: 08:00:00 via INTRAVENOUS

## 2015-11-16 MED ORDER — SCOPOLAMINE 1 MG/3DAYS TD PT72
1.0000 | MEDICATED_PATCH | Freq: Once | TRANSDERMAL | Status: DC
  Administered 2015-11-16: 1.5 mg via TRANSDERMAL

## 2015-11-16 SURGICAL SUPPLY — 70 items
BLADE SURG 11 STRL SS (BLADE) ×5 IMPLANT
BLADE SURG 15 STRL LF C SS BP (BLADE) ×4 IMPLANT
BLADE SURG 15 STRL SS (BLADE) ×5
CANISTER SUCT 3000ML (MISCELLANEOUS) ×5 IMPLANT
CATH FOLEY 2WAY SLVR  5CC 18FR (CATHETERS) ×1
CATH FOLEY 2WAY SLVR 5CC 18FR (CATHETERS) ×4 IMPLANT
CATH FOLEY 3WAY 30CC 18FR (CATHETERS) IMPLANT
CLOTH BEACON ORANGE TIMEOUT ST (SAFETY) ×5 IMPLANT
CONT PATH 16OZ SNAP LID 3702 (MISCELLANEOUS) IMPLANT
DECANTER SPIKE VIAL GLASS SM (MISCELLANEOUS) IMPLANT
DEVICE CAPIO SLIM SINGLE (INSTRUMENTS) IMPLANT
DISSECTOR SPONGE CHERRY (GAUZE/BANDAGES/DRESSINGS) ×5 IMPLANT
DRAPE UNDERBUTTOCKS STRL (DRAPE) ×5 IMPLANT
DRAPE WARM FLUID 44X44 (DRAPE) IMPLANT
DRSG OPSITE POSTOP 4X10 (GAUZE/BANDAGES/DRESSINGS) ×5 IMPLANT
ELECT BLADE 6 FLAT ULTRCLN (ELECTRODE) ×1 IMPLANT
FILTER STRAW FLUID ASPIR (MISCELLANEOUS) IMPLANT
GAUZE PACKING 2X5 YD STRL (GAUZE/BANDAGES/DRESSINGS) ×5 IMPLANT
GAUZE SPONGE 4X4 16PLY XRAY LF (GAUZE/BANDAGES/DRESSINGS) ×6 IMPLANT
GLOVE BIO SURGEON STRL SZ 6.5 (GLOVE) ×10 IMPLANT
GLOVE BIOGEL PI IND STRL 6.5 (GLOVE) ×4 IMPLANT
GLOVE BIOGEL PI IND STRL 7.0 (GLOVE) ×4 IMPLANT
GLOVE BIOGEL PI INDICATOR 6.5 (GLOVE) ×3
GLOVE BIOGEL PI INDICATOR 7.0 (GLOVE) ×4
GOWN STRL REUS W/TWL LRG LVL3 (GOWN DISPOSABLE) ×20 IMPLANT
LIQUID BAND (GAUZE/BANDAGES/DRESSINGS) ×5 IMPLANT
MESH RESTORELLE Y CONTOUR (Mesh General) ×1 IMPLANT
NDL MAYO 6 CRC TAPER PT (NEEDLE) IMPLANT
NEEDLE HYPO 22GX1.5 SAFETY (NEEDLE) ×5 IMPLANT
NEEDLE MAYO 6 CRC TAPER PT (NEEDLE) IMPLANT
NS IRRIG 1000ML POUR BTL (IV SOLUTION) ×6 IMPLANT
PACK ABDOMINAL GYN (CUSTOM PROCEDURE TRAY) ×5 IMPLANT
PACK VAGINAL MINOR WOMEN LF (CUSTOM PROCEDURE TRAY) ×4 IMPLANT
PACK VAGINAL WOMENS (CUSTOM PROCEDURE TRAY) ×4 IMPLANT
PAD MAGNETIC INST (MISCELLANEOUS) ×5 IMPLANT
PLUG CATH AND CAP STER (CATHETERS) ×4 IMPLANT
SET CYSTO W/LG BORE CLAMP LF (SET/KITS/TRAYS/PACK) ×5 IMPLANT
SHEET LAVH (DRAPES) ×5 IMPLANT
SLING TVT EXACT (Sling) ×1 IMPLANT
SPONGE GAUZE 4X4 12PLY STER LF (GAUZE/BANDAGES/DRESSINGS) ×10 IMPLANT
SPONGE LAP 18X18 X RAY DECT (DISPOSABLE) ×10 IMPLANT
SPONGE LAP 4X18 X RAY DECT (DISPOSABLE) ×5 IMPLANT
SPONGE SURGIFOAM ABS GEL 12-7 (HEMOSTASIS) IMPLANT
STAPLER VISISTAT 35W (STAPLE) ×4 IMPLANT
SURGIFLO TRUKIT (HEMOSTASIS) IMPLANT
SUT CAPIO ETHIBPND (SUTURE) ×4 IMPLANT
SUT ETHIBOND 0 (SUTURE) ×8 IMPLANT
SUT PDS AB 0 CT1 27 (SUTURE) IMPLANT
SUT PDS AB 2-0 CT1 27 (SUTURE) IMPLANT
SUT VIC AB 0 CT1 18XCR BRD8 (SUTURE) ×8 IMPLANT
SUT VIC AB 0 CT1 27 (SUTURE) ×25
SUT VIC AB 0 CT1 27XBRD ANBCTR (SUTURE) ×12 IMPLANT
SUT VIC AB 0 CT1 8-18 (SUTURE) ×15
SUT VIC AB 2-0 CT1 (SUTURE) ×5 IMPLANT
SUT VIC AB 2-0 CT1 27 (SUTURE)
SUT VIC AB 2-0 CT1 TAPERPNT 27 (SUTURE) IMPLANT
SUT VIC AB 2-0 CT2 27 (SUTURE) IMPLANT
SUT VIC AB 2-0 SH 27 (SUTURE) ×20
SUT VIC AB 2-0 SH 27XBRD (SUTURE) ×16 IMPLANT
SUT VIC AB 2-0 UR6 27 (SUTURE) IMPLANT
SUT VICRYL 0 TIES 12 18 (SUTURE) ×1 IMPLANT
SUT VICRYL 4-0 PS2 18IN ABS (SUTURE) ×1 IMPLANT
SYR 30ML LL (SYRINGE) IMPLANT
SYR 50ML LL SCALE MARK (SYRINGE) ×4 IMPLANT
SYR CONTROL 10ML LL (SYRINGE) ×2 IMPLANT
TOWEL OR 17X24 6PK STRL BLUE (TOWEL DISPOSABLE) ×10 IMPLANT
TRAY FOLEY BAG SILVER LF 16FR (SET/KITS/TRAYS/PACK) ×5 IMPLANT
TRAY FOLEY CATH SILVER 14FR (SET/KITS/TRAYS/PACK) IMPLANT
TUBING NON-CON 1/4 X 20 CONN (TUBING) ×5 IMPLANT
WATER STERILE IRR 1000ML POUR (IV SOLUTION) ×4 IMPLANT

## 2015-11-16 NOTE — Op Note (Signed)
NAME:  Heather Leach DATE OF BIRTH:   [birth year] MEDICAL RECORD NO.: [id number]   FACILITY: [facility]  PHYSICIAN: Lenard Galloway, M.D.   DATE OF PROCEDURE:  11/16/15    OPERATIVE REPORT   PREOPERATIVE DIAGNOSIS: Complete uterovaginal prolapse.  POSTOPERATIVE DIAGNOSIS: Complete uterovaginal prolapse.  PROCEDURE: Abdominal sacral colpopexy, anterior colporrhaphy,  TVT Exact midurethral sling, and cystoscopy, perineorrhaphy.  SURGEON: Lenard Galloway, MD  ASSISTANT:  Sumner Boast, MD  ANESTHESIA: General endotracheal, Local with lidocaine 1% with  epinephrine 1:100,000.  IV FLUIDS:2500 cc Ringer's lactate.  EBL: 250 cc   URINE OUTPUT:  1550 mL.  COMPLICATIONS: None.  INDICATIONS FOR THE PROCEDURE: The patient is a 60 year old  Para 2 female, who presents with progressive uterovaginal prolapse,  urinary incontinence with lifting at work, and difficulty with voiding.  She  was referred by her primary gynecologist, Dr. Sumner Boast to plan for  vaginal reconstruction for prolapse at the time of her concurrent  hysterectomy.  The patient has known uterine fibroids.  The patient was  noted to have urinary retention by Dr. Talbert Nan who catheterized the  patient for 270 cc at an office visit.  On physical exam, the patient has  uterine procidentia.  A plan is now made to proceed with an abdominal  sacral colpopexy, Halban's culdoplasty, TVT Exact mid urethral sling, and  anterior and posterior colporrhaphy  Risks, benefits, and alternatives have  been reviewed with the patient who wishes to proceed.  FINDINGS: Examination under anesthesia revealed uterine procidentia.  At the time of laparotomy, the patient was noted to have bilateral dilated  and somewhat tortuous distal ureters. The appendix was unremarkable.  The liver edge and gallbladder palpated to be normal. The right kidney felt  prominent, and the left kidney was palpably normal.  The  periaortic region  was unremarkable.  There were no bowel adhesions.  Cystoscopy at termination of the bladder portion of the procedure  documented the bladder to be intact throughout 360 degrees including  the bladder dome and trigone. There was no evidence of a foreign body  in the bladder or the urethra. The ureters were patent bilaterally.  SPECIMENS: none.  PROCEDURE IN DETAIL: The patient was reidentified in the preop  hold area. She did receive cefotetan 2 g IV for antibiotic prophylaxis.  She received Lovenox and PAS stockings for DVT prophylaxis.  The patient was escorted to the operating room, where she was placed in the dorsal lithotomy position in [place]. General endotracheal anesthesia was induced. The patient's lower abdomen, vagina, and perineum were sterilely prepped and she was sterilely draped. The Foley catheter was placed inside the bladder and left to gravity drainage throughout and at the termination of the procedure.  Dr. Talbert Nan performed the total abdominal hysterectomy with bilateral  salpingo-oophorectomy.  Please refer to this dictation separately.  Hemostasis was good at the termination of the hysterectomy procedure.  The abdominal sacrocolpopexy was performed next. An EEA Sizer was placed in the vagina at this time and the peritoneum along the anterior and posterior vaginal walls was opened and dissection performed down to the level of the underside of the vagina, both anteriorly and posteriorly.  Attention at this time was turned to the sacral promontory. The peritoneum overlying the sacral promontory was identified along with the landmarks of the bifurcation of the aorta and iliac vessels and the right ureter. The peritoneum was opened using a combination of a Metzenbaum scissors and monopolar cautery. Careful dissection was  performed down to the level of the sacral promontory. The anterior longitudinal ligament was  identified along with the middle sacral vessels. Hemostasis was good. The peritoneum was then opened inferiorly along the patient's right pelvic sidewall and all the way down to the level of dissection along the posterior vaginal peritoneal incision.  Halban's culdoplasty sutures were placed using 0/0 Ethibond sutures.  The Restorelle Colpoplast mesh was trimmed to properly fit the patient's anatomy and the mesh was secured to the anterior vagina using a series of 0/0 Ethibond sutures. The same was performed along the posterior vaginal Maina.  The EEA Sizer was removed and a hand was placed inside the vagina and the elevation of the vaginal cuff was simulated. The point of attachment of the mesh was then mapped to the sacral promontory and 2 sutures of 0 Ethibond were brought through the anterior longitudinal ligament with care taken to avoid the middle sacral vessels. These sutures were then brought through the mesh. The sutures were tied. Excess mesh was trimmed at this time. The mesh was made retroperitoneal by closing the peritoneum over it using 2-0 Vicryl suture. 2-0 Vicryl suture was also used to close the peritoneum over the vaginal cuff extending from right round ligament to the left round ligament. This allowed 100% coverage of the mesh material.  The pelvis was irrigated and suctioned. Hemostasis was then good. The abdomen was closed at this time by Dr. Talbert Nan.  Please refer to this  dictation separately.   Attention was turned to the vaginal portion of the procedure at this point. The patient was examined and there was excellent vaginal cuff support. There was evidence of a cystocele at this time. There was no rectocele noted.  Allis clamps were used to mark the anterior vaginal Gomez beginning 1 cm below the urethral meatus and extending down toward the vaginal cuff. The mucosa was injected locally with 1% lidocaine with epinephrine 1:100,000. The  mucosa was incised vertically in the midline with a scalpel and a combination of sharp and blunt dissection were used to dissect this vaginal tissue and mucosa off the underlying bladder. The dissection was carried back to the pubic rami.  A 1 cm suprapubic incisions were then created 2 cm to the right and left in the midline. The Foley catheter was removed. The TVT Exact was performed in a bottom up fashion. The urethral guide and Foley tip were placed in the urethra and the urethra was deflected properly as the TVT guide was brought through the right retropubic space and up through the right suprapubic incision. The urethra was then deflected in the opposite direction and the TVT guide was placed through the left retropubic space and through the left suprapubic ipsilateral incision.  The obturator guide was removed at this time and cystoscopy was Performed.  There was no foreign body in the urethra or the bladder. The ureters were not demonstrating jets at this time using sterile water.  The bladder was drained of all cystoscopic fluid and the Foley catheter was replaced. The sling was brought up through the suprapubic incisions bilaterally. The plastic sheaths were removed as a Kelly clamp was placed between the urethra and the sling. The sling was noted to be in excellent position. Excess mesh was trimmed suprapubically.   The anterior colporrhaphy was performed using vertical mattress sutures of 0/0 Vicryl. A figure of 8 suture of 2/0 Vicryl was used for hemostasis of a vein on the left side of the bladder.  Excess  vaginal mucosa was trimmed. the anterior vaginal Waller was closed with a running lock suture of 2-0 Vicryl.  Final cystoscopy was performed with D10 W and the bilateral ureters were noted to be patent. The bladder was drained of cystoscopic fluid and the Foley catheter was replaced and left to gravity drainage.  The suprapubic incisions were closed at the  termination of the procedure with Dermabond.  Attention was turned to the perineum. Allis clamps were used to mark the  perineal body which was injected with 1% lidocaine with epinephrine 1:100,000. A triangular wedge of epithelium was excised from the perineal body.  Two sutures of 0 Vicryl were placed along the superficial transverse perineal muscles.  The mucosa was closed with a running suture  of 2/0 Vicryl which was brought up the perineal body in a subcuticular fashion.  Vaginal examination confirmed excellent vaginal support and elevation. Final rectal exam documented the absence of sutures in the rectum.   A gauze packing with Estrace cream was placed inside the vagina.  At this point, the patient was awakened and extubated, and escorted to the recovery room in stable condition. There were no complications. All needle, instrument, and sponge counts were correct.     Lenard Galloway, M.D.

## 2015-11-16 NOTE — Progress Notes (Addendum)
Day of Surgery Procedure(s) (LRB): TOTAL ABDOMINAL HYSTERECTOMY (N/A) BILATERAL SALPINGO OOPHORECTOMY (Bilateral) ABDOMINO SACROCOLPOPEXY HALBAN'S CULDOPLASTY  (N/A) ANTERIOR (CYSTOCELE) AND PERINEORRHAPHY (N/A) TRANSVAGINAL TAPE (TVT) PROCEDURE MED URETHRAL SLING  (N/A) CYSTOSCOPY (N/A)  Subjective: Patient is doing well, no significant pain unless she tries to move. Tolerating sips of water, no nausea, emesis  Objective: I have reviewed patient's vital signs, intake and output and labs.  General: alert, cooperative and no distress Resp: slight inspiratory crackles at bilateral bases, c/w atelectasis Cardio: regular rate and rhythm, S1, S2 normal, no murmur, click, rub or gallop GI: soft, non tender, hypoactive BS. Dressing: clean, dry and intact.  Extremities: extremities normal, atraumatic, no cyanosis or edema Vaginal Bleeding: none   CBC Latest Ref Rng 11/16/2015 11/11/2015 10/28/2015  WBC 4.0 - 10.5 K/uL 9.3 7.9 6.3  Hemoglobin 12.0 - 15.0 g/dL 12.0 13.4 -  Hematocrit 36.0 - 46.0 % 36.3 40.2 41.3  Platelets 150 - 400 K/uL 174 197 220   Lab Results  Component Value Date   CREATININE 1.59* 11/16/2015   CREATININE 1.57* 10/28/2015   CREATININE 1.24* 08/19/2015   Today's Vitals   11/16/15 1429 11/16/15 1445 11/16/15 1500 11/16/15 1530  BP: 99/59   112/49  Pulse: 58   52  Temp: 98.4 F (36.9 C)   97.9 F (36.6 C)  TempSrc:    Oral  Resp: 16 16  16   Height:   5' 5.5" (1.664 m)   Weight:   182 lb (82.555 kg)   SpO2: 98% 98%  100%  PainSc: 5  5   5     I/O: 3285/2275  Assessment: s/p Procedure(s): TOTAL ABDOMINAL HYSTERECTOMY (N/A) BILATERAL SALPINGO OOPHORECTOMY (Bilateral) ABDOMINO SACROCOLPOPEXY HALBAN'S CULDOPLASTY  (N/A) ANTERIOR (CYSTOCELE) AND PERINEORRHAPHY (N/A) TRANSVAGINAL TAPE (TVT) PROCEDURE MED URETHRAL SLING  (N/A) CYSTOSCOPY (N/A): stable and progressing well  Plan: Advance diet as tolerated Vaginal pack out in the am Will change to oral pain  medication when tolerated Lovenox in the am (per pharmacy, dose changed to 40mg ) Foley out POD#2   LOS: 0 days    Salvadore Dom 11/16/2015, 5:06 PM    Addendum: second dose of Cefotetan this evening.

## 2015-11-16 NOTE — Anesthesia Procedure Notes (Signed)
Procedure Name: Intubation Date/Time: 11/16/2015 7:27 AM Performed by: Tobin Chad Pre-anesthesia Checklist: Patient identified, Emergency Drugs available, Patient being monitored, Suction available and Timeout performed Patient Re-evaluated:Patient Re-evaluated prior to inductionOxygen Delivery Method: Circle system utilized and Simple face mask Preoxygenation: Pre-oxygenation with 100% oxygen Intubation Type: IV induction Ventilation: Mask ventilation without difficulty Laryngoscope Size: Mac and 3 Grade View: Grade II Tube size: 7.0 mm Number of attempts: 1 Placement Confirmation: ETT inserted through vocal cords under direct vision,  positive ETCO2 and breath sounds checked- equal and bilateral Secured at: 22 (com) cm Tube secured with: Tape Dental Injury: Teeth and Oropharynx as per pre-operative assessment

## 2015-11-16 NOTE — Progress Notes (Signed)
Update to History and Physical  Patient has seen her PCP for a recheck of her hemoglobin A1C which is significantly lower.  Patient does not need medication to control her blood sugar.  She has had a mammogram which was benign.  Her rectal bleeding was attributed to hemorrhoids.   Patient examined.   OK to proceed with surgery.

## 2015-11-16 NOTE — Anesthesia Postprocedure Evaluation (Signed)
Anesthesia Post Note  Patient: Heather Leach  Procedure(s) Performed: Procedure(s) (LRB): TOTAL ABDOMINAL HYSTERECTOMY (N/A) BILATERAL SALPINGO OOPHORECTOMY (Bilateral) ABDOMINO SACROCOLPOPEXY HALBAN'S CULDOPLASTY  (N/A) ANTERIOR (CYSTOCELE) AND PERINEORRHAPHY (N/A) TRANSVAGINAL TAPE (TVT) PROCEDURE MED URETHRAL SLING  (N/A) CYSTOSCOPY (N/A)  Patient location during evaluation: [facility] Anesthesia Type: General Level of consciousness: awake and alert and oriented Pain management: pain level controlled Vital Signs Assessment: post-procedure vital signs reviewed and stable Respiratory status: spontaneous breathing, respiratory function stable and patient connected to nasal cannula oxygen Cardiovascular status: stable Postop Assessment: no signs of nausea or vomiting and adequate PO intake Anesthetic complications: no     Last Vitals:  Filed Vitals:   11/16/15 1445 11/16/15 1530  BP:  112/49  Pulse:  52  Temp:  36.6 C  Resp: 16 16    Last Pain:  Filed Vitals:   11/16/15 1546  PainSc: 5    Pain Goal: Patients Stated Pain Goal: 3 (11/16/15 1530)               Willa Rough

## 2015-11-16 NOTE — OR Nursing (Signed)
Patients family updated on surgical progress and patient condition.

## 2015-11-16 NOTE — Interval H&P Note (Signed)
History and Physical Interval Note:  11/16/2015 7:12 AM  Heather Leach  has presented today for surgery, with the diagnosis of PMB, thickened endometrium,complete uterovaginal prolapse, grade 4 cystocele  The various methods of treatment have been discussed with the patient and family. After consideration of risks, benefits and other options for treatment, the patient has consented to  Procedure(s): TOTAL ABDOMINAL HYSTERECTOMY (N/A) BILATERAL SALPINGO OOPHORECTOMY (Bilateral) ABDOMINO SACROCOLPOPEXY HALBAN'S CULDOPLASTY  (N/A) ANTERIOR (CYSTOCELE) AND POSTERIOR REPAIR (RECTOCELE) (N/A) TRANSVAGINAL TAPE (TVT) PROCEDURE MED URETHRAL SLING  (N/A) CYSTOSCOPY (N/A) as a surgical intervention .  The patient's history has been reviewed, patient examined, no change in status, stable for surgery.  I have reviewed the patient's chart and labs.  Questions were answered to the patient's satisfaction.     Salvadore Dom

## 2015-11-16 NOTE — Op Note (Signed)
Preoperative Diagnosis: Procidentia, fibroid uterus  Postoperative Diagnosis: Same  Procedure:  Total Abdominal Hysterectomy with bilateral salpingo-oophorectomy  Dr Quincy Simmonds did a separate procedure, please see her operative note  Surgeon: Dr Sumner Boast  Assistant: Dr Josefa Half  Anesthesia: General  EBL: 50 cc for my portion of the surgery  Fluids: 2,000 cc LR (for entire case)  Urine output: 1550 (entire case)  Complications: none  Indications for surgery: The patient is a 60 year old female, who presented with severe genital prolapse/procidentia and a fibroid uterus. Work up included a gyn ultrasound, a normal pap and a negative endometrial biopsy.  The patient is aware of the risks and complications involved with the surgery and consent was obtained prior to the procedure.  Findings: 8 week sized fibroid uterus, normal adnexa bilaterally, dilated ureters bilaterally, normal appendix. Intraoperative uterine weight was 284.6 grams  Procedure: The patient was taken to the operating room with an IV in placed, preoperative antibiotics and lovenox had been administered. She was placed in the dorsal lithotomy position. General anesthesia was administered. She was prepped and draped in the usual sterile fashion for an abdominal, vaginal surgery. A foley catheter was placed.   An approximately 10 cm pfannenstiel incision was made with a #10 blade. The subcutaneous tissue was taken down with cautery. The fascia was incised in the midline with a knife and taken down bilaterally with the mayo scissors. The fascia was dissection from the underlying muscle with a combination of sharp and blunt dissection.  The peritoneum was bluntly entered and the abdominal pelvic cavity was explored.An alexis retractor was placed. 3 lap pads were packed in the abdomen for visualization.  A kelly clamp was placed on the lateral aspect of the uterus bilaterally. The round ligament on the right was elevated a  suture of 0-Vicryl was placed through the round ligament and the round ligament was cut with cautery. The anterior leaf of the broad ligament was taken down to the level of cervix. The IP ligament on the right was clamped, cut and doubly ligated with a 0-Vicryl suture. The posterior leaf of the broad ligament was taken down sharply. The uterine vessels were skeletonized. The bladder flap was developed. The uterine vessels were clamped, cut and suture ligated with 0-Vicryl. The same procedure was repeated on the left. The vagina was sharply entered and the cervix was separated from the vagina sharply. The vaginal cuff was closed with interrupted sutures of 0-Vicryl. Hemostasis was excellent.  Dr Quincy Simmonds then performed her portion of the abdominal procedure. Please see her operative note.   At the completion of Dr Elza Rafter portion of the case the peritoneum was closed with 2-0 Vicryl suture. The fascia was closed with running sutures of 0-Vicryl. 30 cc of Exparel was injected into the subcutaneous tissue. The subcutaneous tissue was irrigated and suctioned dry, slight oozing was stopped with cautery. The subcutaneous space was closed with interrupted sutures of 2-0 plain. The skin was closed with a subcuticular stitch of 4-0 Vicryl. Dermabond was placed over the incision. The incision was covered with a honeycomb bandage.   Dr Quincy Simmonds then completed the vaginal portion of the procedure

## 2015-11-16 NOTE — OR Nursing (Signed)
Family updated @ 1110

## 2015-11-16 NOTE — Transfer of Care (Signed)
Immediate Anesthesia Transfer of Care Note  Patient: Heather Leach  Procedure(s) Performed: Procedure(s): TOTAL ABDOMINAL HYSTERECTOMY (N/A) BILATERAL SALPINGO OOPHORECTOMY (Bilateral) ABDOMINO SACROCOLPOPEXY HALBAN'S CULDOPLASTY  (N/A) ANTERIOR (CYSTOCELE) AND PERINEORRHAPHY (N/A) TRANSVAGINAL TAPE (TVT) PROCEDURE MED URETHRAL SLING  (N/A) CYSTOSCOPY (N/A)  Patient Location: PACU  Anesthesia Type:General  Level of Consciousness: awake, alert  and oriented  Airway & Oxygen Therapy: Patient Spontanous Breathing and Patient connected to nasal cannula oxygen  Post-op Assessment: Report given to RN and Post -op Vital signs reviewed and stable  Post vital signs: Reviewed and stable  Last Vitals:  Filed Vitals:   11/16/15 0603  BP: 164/80  Pulse: 54  Temp: 36.7 C    Last Pain: There were no vitals filed for this visit.       Complications: No apparent anesthesia complications

## 2015-11-16 NOTE — Brief Op Note (Signed)
11/16/2015  12:32 PM  PATIENT:  Heather Leach  60 y.o. female  PRE-OPERATIVE DIAGNOSIS:  Postmenopausal bleeding, thickened endometrium, uterine fibroids, complete uterovaginal prolapse.  POST-OPERATIVE DIAGNOSIS:  Postmenopausal bleeding, thickened endometrium,uterine fibroids, complete uterovaginal prolapse,.  PROCEDURE:  Procedure(s): TOTAL ABDOMINAL HYSTERECTOMY (N/A) BILATERAL SALPINGO OOPHORECTOMY (Bilateral) ABDOMINO SACROCOLPOPEXY HALBAN'S CULDOPLASTY  (N/A) ANTERIOR (CYSTOCELE) AND PERINEORRHAPHY (N/A) TRANSVAGINAL TAPE (TVT) PROCEDURE MED URETHRAL SLING  (N/A) CYSTOSCOPY (N/A)  SURGEON:  Surgeon(s) and Role: Panel 1:    * Salvadore Dom, MD - Primary  Panel 2:    * Nunzio Cobbs, MD - Primary  PHYSICIAN ASSISTANT: NA  ASSISTANTS:  Thelia Tanksley E. Yisroel Ramming, MD and Salvadore Dom, MD   ANESTHESIA:   local, general and Exparel subcutaneously.   EBL:  Total I/O In: 2500 [I.V.:2500] Out: [id number] [Urine:1550; Blood:300]  BLOOD ADMINISTERED:none  DRAINS: Urinary Catheter (Foley)   LOCAL MEDICATIONS USED:  LIDOCAINE   SPECIMEN:  Source of Specimen:  uterus, cervix, bilateral tubes and bilateral ovaries.   DISPOSITION OF SPECIMEN:  PATHOLOGY  COUNTS:  YES  TOURNIQUET:  * No tourniquets in log *  DICTATION: .Note written in EPIC  PLAN OF CARE: Admit to inpatient   PATIENT DISPOSITION:  PACU - hemodynamically stable.   Delay start of Pharmacological VTE agent (>24hrs) due to surgical blood loss or risk of bleeding: no

## 2015-11-16 NOTE — OR Nursing (Signed)
Family updated.

## 2015-11-17 ENCOUNTER — Encounter (HOSPITAL_COMMUNITY): Payer: Self-pay | Admitting: Obstetrics and Gynecology

## 2015-11-17 LAB — BASIC METABOLIC PANEL
Anion gap: 8 (ref 5–15)
BUN: 14 mg/dL (ref 6–20)
CHLORIDE: 109 mmol/L (ref 101–111)
CO2: 25 mmol/L (ref 22–32)
Calcium: 8.8 mg/dL — ABNORMAL LOW (ref 8.9–10.3)
Creatinine, Ser: 1.48 mg/dL — ABNORMAL HIGH (ref 0.44–1.00)
GFR, EST AFRICAN AMERICAN: 44 mL/min — AB (ref >60)
GFR, EST NON AFRICAN AMERICAN: 38 mL/min — AB (ref >60)
Glucose, Bld: 121 mg/dL — ABNORMAL HIGH (ref 65–99)
POTASSIUM: 3.5 mmol/L (ref 3.5–5.1)
SODIUM: 142 mmol/L (ref 135–145)

## 2015-11-17 LAB — CBC
HCT: 32.4 % — ABNORMAL LOW (ref 36.0–46.0)
HEMOGLOBIN: 10.6 g/dL — AB (ref 12.0–15.0)
MCH: 26.8 pg (ref 26.0–34.0)
MCHC: 32.7 g/dL (ref 30.0–36.0)
MCV: 82 fL (ref 78.0–100.0)
PLATELETS: 165 10*3/uL (ref 150–400)
RBC: 3.95 MIL/uL (ref 3.87–5.11)
RDW: 14.9 % (ref 11.5–15.5)
WBC: 6.7 10*3/uL (ref 4.0–10.5)

## 2015-11-17 NOTE — Progress Notes (Signed)
Vaginal packing removed as ordered. Scant amount of drainage noted. Patient tolerated well.

## 2015-11-17 NOTE — Progress Notes (Signed)
1 Day Post-Op Procedure(s) (LRB): TOTAL ABDOMINAL HYSTERECTOMY (N/A) BILATERAL SALPINGO OOPHORECTOMY (Bilateral) ABDOMINO SACROCOLPOPEXY HALBAN'S CULDOPLASTY  (N/A) ANTERIOR (CYSTOCELE) AND PERINEORRHAPHY (N/A) TRANSVAGINAL TAPE (TVT) PROCEDURE MED URETHRAL SLING  (N/A) CYSTOSCOPY (N/A)  Subjective: Patient reports incisional pain.   States pain was well controlled last night.  Wants to eat.   Denies flatus and nausea. Ambulated twice.  Vaginal packing out.   Objective: I have reviewed patient's vital signs, intake and output and labs. T max 99 - T now 98.5, BP 102/55, P 65, RR 21 I/O - 6003/4225 cc Hgb 10.6, Cr 1.48.  General: alert and cooperative Resp: clear to auscultation bilaterally Cardio: S1S2 RRR.  S3 present. GI: Scant BS, slightly distended, nontender.  Dressing - C, D, I.  SP incisions intact. Extremities: PAS on.  DPs 2+ - bilaterally.  Vaginal Bleeding: minimal  Assessment: s/p Procedure(s): TOTAL ABDOMINAL HYSTERECTOMY (N/A) BILATERAL SALPINGO OOPHORECTOMY (Bilateral) ABDOMINO SACROCOLPOPEXY HALBAN'S CULDOPLASTY  (N/A) ANTERIOR (CYSTOCELE) AND PERINEORRHAPHY (N/A) TRANSVAGINAL TAPE (TVT) PROCEDURE MED URETHRAL SLING  (N/A) CYSTOSCOPY (N/A): stable Creatinine improved this am.  Plan: Advance diet Encourage ambulation Advance to PO medication last dose of Levenox now.   Surgical findings and procedure discussed.  Continue foley until tomorrow am when will start voiding trials.   LOS: 1 day    Heather Leach Matthew Saras 11/17/2015, 7:58 AM

## 2015-11-18 ENCOUNTER — Other Ambulatory Visit: Payer: Self-pay | Admitting: Obstetrics and Gynecology

## 2015-11-18 MED ORDER — FERROUS SULFATE 325 (65 FE) MG PO TBEC: DELAYED_RELEASE_TABLET | ORAL | Status: DC

## 2015-11-18 MED ORDER — OXYCODONE-ACETAMINOPHEN 5-325 MG PO TABS: 1.0000 | ORAL_TABLET | ORAL | Status: DC | PRN

## 2015-11-18 MED ORDER — CIPROFLOXACIN HCL 250 MG PO TABS: 250.0000 mg | ORAL_TABLET | Freq: Two times a day (BID) | ORAL | Status: DC

## 2015-11-18 NOTE — Progress Notes (Signed)
2 Days Post-Op Procedure(s) (LRB): TOTAL ABDOMINAL HYSTERECTOMY (N/A) BILATERAL SALPINGO OOPHORECTOMY (Bilateral) ABDOMINO SACROCOLPOPEXY HALBAN'S CULDOPLASTY  (N/A) ANTERIOR (CYSTOCELE) AND PERINEORRHAPHY (N/A) TRANSVAGINAL TAPE (TVT) PROCEDURE MED URETHRAL SLING  (N/A) CYSTOSCOPY (N/A)  Late Entry - patient seen at 8:00 am today.   Subjective: Patient reports tolerating PO.   Ambulating.  Good pain control with Percocet. Foley out and no void yet.  Objective: I have reviewed patient's vital signs T max 100, T now 99.2, BP 109/51, P 68, RR 18    General: alert and cooperative Resp: clear to auscultation bilaterally Cardio: regular rate and rhythm, S1, S2 normal, no murmur, click, rub or gallop GI: soft, nontender, bowel sounds somewhat decreased but present, incisions- clean, dry, intact, dermabond present. Extremities: no edema. Vaginal Bleeding: minimal  Assessment: s/p Procedure(s): TOTAL ABDOMINAL HYSTERECTOMY (N/A) BILATERAL SALPINGO OOPHORECTOMY (Bilateral) ABDOMINO SACROCOLPOPEXY HALBAN'S CULDOPLASTY  (N/A) ANTERIOR (CYSTOCELE) AND PERINEORRHAPHY (N/A) TRANSVAGINAL TAPE (TVT) PROCEDURE MED URETHRAL SLING  (N/A) CYSTOSCOPY (N/A): progressing well  Low grade temp.  Probably atelectasis.   Plan: Voiding trials.  Foley catheter and prophylactic Ciprofloxacin if needs Foley catheter at time of discharge. Encourage ambulation.  Plan for discharge today.  Instructions and precautions reviewed in verbal and written form. Rx for Percocet.  Iron sulfate 325 mg daily.  I discussed Miralax daily at home until bowel function returns. Then use Colace 100 mg daily for stool softening.  Follow up in 6 days.    LOS: 2 days    Arloa Koh 11/18/2015, 7:16 PM

## 2015-11-18 NOTE — Progress Notes (Signed)
Pt verbalizes understanding of d/c instructions, medications, follow up appts, when to seek medical attention and belongings policy. IV was d/c without complcations. Pt has no questions at this time. MD aware that foley was replaced per previous order. MD states that she called in prophylactic antibiotic to pts pharmacy. Pt verbalizes clear understanding of instructions and care for foley catheter and leg bag. Pt ambulated to the main entrance, accompanied by me and pts friend/sister. Pts sister will be driving her home. Pt was assisted to the front seat of the vehicle she will be riding home in. Marry Guan

## 2015-11-18 NOTE — Discharge Instructions (Signed)
Abdominal Hysterectomy, Care After °Refer to this sheet in the next few weeks. These instructions provide you with information on caring for yourself after your procedure. Your health care provider may also give you more specific instructions. Your treatment has been planned according to current medical practices, but problems sometimes occur. Call your health care provider if you have any problems or questions after your procedure.  °WHAT TO EXPECT AFTER THE PROCEDURE °After your procedure, it is typical to have the following: °· Pain. °· Feeling tired. °· Poor appetite. °· Less interest in sex. °It takes 4-6 weeks to recover from this surgery.  °HOME CARE INSTRUCTIONS  °· Take pain medicines only as directed by your health care provider. Do not take over-the-counter pain medicines without checking with your health care provider first.  °· Change your bandage as directed by your health care provider. °· Return to your health care provider to have your sutures taken out. °· Take showers instead of baths for 2-3 weeks. Ask your health care provider when it is safe to start showering.  °· Do not douche, use tampons, or have sexual intercourse for at least 6 weeks or until your health care provider says you can.   °· Follow your health care provider's advice about exercise, lifting, driving, and general activities. °· Get plenty of rest and sleep.   °· Do not lift anything heavier than a gallon of milk (about 10 lb [4.5 kg]) for the first month after surgery. °· You can resume your normal diet if your health care provider says it is okay.   °· Do not drink alcohol until your health care provider says you can.   °· If you are constipated, ask your health care provider if you can take a mild laxative. °· Eating foods high in fiber may also help with constipation. Eat plenty of raw fruits and vegetables, whole grains, and beans. °· Drink enough fluids to keep your urine clear or pale yellow.   °· Try to have someone at  home with you for the first 1-2 weeks to help around the house. °· Keep all follow-up appointments. °SEEK MEDICAL CARE IF:  °· You have chills or fever. °· You have swelling, redness, or pain in the area of your incision that is getting worse.   °· You have pus coming from the incision.   °· You notice a bad smell coming from the incision or bandage.   °· Your incision breaks open.   °· You feel dizzy or light-headed.   °· You have pain or bleeding when you urinate.   °· You have persistent diarrhea.   °· You have persistent nausea and vomiting.   °· You have abnormal vaginal discharge.   °· You have a rash.   °· You have any type of abnormal reaction or develop an allergy to your medicine.   °· Your pain medicine is not helping.   °SEEK IMMEDIATE MEDICAL CARE IF:  °· You have a fever and your symptoms suddenly get worse. °· You have severe abdominal pain. °· You have chest pain. °· You have shortness of breath. °· You faint. °· You have pain, swelling, or redness of your leg. °· You have heavy vaginal bleeding with blood clots. °MAKE SURE YOU: °· Understand these instructions. °· Will watch your condition. °· Will get help right away if you are not doing well or get worse. °  °This information is not intended to replace advice given to you by your health care provider. Make sure you discuss any questions you have with your health care provider. °  °Document   Released: 12/30/2004 Document Revised: 07/03/2014 Document Reviewed: 04/04/2013 Elsevier Interactive Patient Education 2016 Elsevier Inc.  Urethral Vaginal Sling, Care After Refer to this sheet in the next few weeks. These instructions provide you with information on caring for yourself after your procedure. Your health care provider may also give you more specific instructions. Your treatment has been planned according to current medical practices, but problems sometimes occur. Call your health care provider if you have any problems or questions after your  procedure.  WHAT TO EXPECT AFTER THE PROCEDURE  After your procedure, it is typical to have the following:  A catheter in your bladder until your bladder is able to work on its own properly. You will be instructed on how to empty the catheter bag.  Absorbable stitches in your incisions. They will slowly dissolve over 1-2 months. HOME CARE INSTRUCTIONS  Get plenty of rest.  Only take over-the-counter or prescription medicines as directed by your health care provider. Do not take aspirin because it can cause bleeding.  Do not take baths. Take showers until your health care provider tells you otherwise.  You may resume your usual diet. Eat a well-balanced diet.  Drink enough fluids to keep your urine clear or pale yellow.  Limit exercise and activities as directed by your health care provider. Do not lift anything heavier than 5 pounds (2.3 kg).  Do not douche, use tampons, or have sexual intercourse for 6 weeks after your procedure.  Follow up with your health care provider as directed. SEEK MEDICAL CARE IF:  You have a heavy or bad smelling vaginal discharge.   You have a rash.   You have pain that is not controlled with medicines.   You have lightheadedness or feel faint.  SEEK IMMEDIATE MEDICAL CARE IF:  You have a fever.   You have vaginal bleeding.   You faint.   You have shortness of breath.   You have chest, abdominal, or leg pain.   You have pain when urinating or cannot urinate.   Your catheter is still in your bladder and becomes blocked.   You have swelling, redness, and pain in the vaginal area.    This information is not intended to replace advice given to you by your health care provider. Make sure you discuss any questions you have with your health care provider.   Document Released: 04/02/2013 Document Reviewed: 04/02/2013 Elsevier Interactive Patient Education [employer].

## 2015-11-18 NOTE — Discharge Summary (Signed)
Physician Discharge Summary  Patient ID: Heather Leach MRN: [id number] DOB/AGE: Oct 09, 1955 60 y.o.  Admit date: 11/16/2015 Discharge date: 11/18/2015  Admission Diagnoses: 1.  Complete uterovaginal prolapse.  2.  Postmenopausal bleeding.  3.  Uterine fibroids. 4.  Renal insufficiency.  Discharge Diagnoses:  1.  Complete uterovaginal prolapse. 2.  Postmenopausal bleeding. 3.  Uterine fibroids. 4.  Renal insufficiency, improved. 5.  Status post total abdominal hysterectomy with bilateral salpingectomy, abdomino-sacrocolpopexy, Halban's culdoplasty, TVT Exact midurethral sling, cystoscopy, anterior colporrhaphy, perineorrhaphy.  6.  Bilateral ureteral dilation.. 7.  Post op anemia. 8.  Post op urinary retention.  Active Problems:   Status post total abdominal hysterectomy and bilateral salpingo-oophorectomy   Discharged Condition: good  Hospital Course: The patient was admitted on 11/16/15 for a total abdominal hysterectomy with bilateral salpingectomy, abdomino-sacrocolpopexy, Halban's culdoplasty, TVT Exact midurethral sling, cystoscopy, anterior colporrhaphy, and perineorrhaphy which were performed without complication while under general anesthesia.  The patient's post op course was uneventful.  She had a morphine PCA for pain control initially, and this was converted over to Percocet on post op day one when the patient began taking po well.  She ambulated independently and wore PAS for DVT prophylaxis while in bed.  She received preop and 24 hour post op Lovenox for DVT prophylaxis also. Her foley catheter were removed on post op day two, and she was unable to void.  She was therefore discharged to home with a Foley catheter to gravity drainage.  The patient's vital signs remained stable and she demonstrated a maximum low grade temp of 100 degrees F which was attributed to atelectasis.  The patient's post op day one Hgb was 10.6.   She was tolerating the this well.  Her post of creatinine  decreased from 1.59 immediately after surgery to 1.48 on post op day number one.  She had very minimal vaginal bleeding, and her incisions demonstrated no signs of erythema or significant drainage.  She was found to be in good condition and ready for discharge on post op day two.    Consults: None  Significant Diagnostic Studies: labs:  See Hospital Course.  Treatments: surgery:  total abdominal hysterectomy with bilateral salpingectomy, abdomino-sacrocolpopexy, Halban's culdoplasty, TVT Exact midurethral sling, cystoscopy, anterior colporrhaphy, and perineorrhaphy performed on 11/16/15 with Dr. Nadene Rubins and Dr. Josefa Half.  Discharge Exam: Blood pressure 109/51, pulse 68, temperature 99.2 F (37.3 C), temperature source Oral, resp. rate 18, height 5' 5.5" (1.664 m), weight 182 lb (82.555 kg), last menstrual period 06/27/2011, SpO2 93 %. General: alert and cooperative Resp: clear to auscultation bilaterally Cardio: regular rate and rhythm, S1, S2 normal, no murmur, click, rub or gallop GI: soft, nontender, bowel sounds somewhat decreased but present, incisions- clean, dry, intact, dermabond present. Extremities: no edema. Vaginal Bleeding: minimal  Disposition: 01-Home or Self Care  Discharge instructions were reviewed in verbal and written form.     Medication List    TAKE these medications        ferrous sulfate 325 (65 FE) MG EC tablet  Take one tablet 325 mg once daily.     meclizine 25 MG tablet  Commonly known as:  ANTIVERT  Take 25 mg by mouth 3 (three) times daily as needed for dizziness. Reported on 08/30/2015     oxyCODONE-acetaminophen 5-325 MG tablet  Commonly known as:  PERCOCET/ROXICET  Take 1-2 tablets by mouth every 4 (four) hours as needed for severe pain (moderate to severe pain (when tolerating fluids)).  ASK your doctor about these medications        ciprofloxacin 250 MG tablet  Commonly known as:  CIPRO  Take 1 tablet (250 mg total) by mouth 2  (two) times daily.      Miralax daily until bowel movements resume.  When bowel movements resume, stop taking Miralax and take Colace 100 mg daily.   Follow-up Information    Follow up with Arloa Koh, MD In 6 days.   Specialty:  Obstetrics and Gynecology   Contact information:   [address] [place] [address] [phone]       Signed: Arloa Koh 11/18/2015, 7:49 PM

## 2015-11-24 ENCOUNTER — Telehealth: Payer: Self-pay | Admitting: Obstetrics and Gynecology

## 2015-11-24 ENCOUNTER — Encounter: Payer: Self-pay | Admitting: Obstetrics and Gynecology

## 2015-11-24 ENCOUNTER — Ambulatory Visit (INDEPENDENT_AMBULATORY_CARE_PROVIDER_SITE_OTHER): Payer: BLUE CROSS/BLUE SHIELD | Admitting: Obstetrics and Gynecology

## 2015-11-24 VITALS — BP 120/70 | HR 70 | Resp 16 | Ht 65.5 in | Wt 183.0 lb

## 2015-11-24 DIAGNOSIS — R748 Abnormal levels of other serum enzymes: Secondary | ICD-10-CM

## 2015-11-24 DIAGNOSIS — D649 Anemia, unspecified: Secondary | ICD-10-CM | POA: Diagnosis not present

## 2015-11-24 DIAGNOSIS — R7989 Other specified abnormal findings of blood chemistry: Secondary | ICD-10-CM | POA: Diagnosis not present

## 2015-11-24 DIAGNOSIS — Z9889 Other specified postprocedural states: Secondary | ICD-10-CM

## 2015-11-24 DIAGNOSIS — R339 Retention of urine, unspecified: Secondary | ICD-10-CM

## 2015-11-24 LAB — CBC
HCT: 39.3 % (ref 35.0–45.0)
Hemoglobin: 12.7 g/dL (ref 11.7–15.5)
MCH: 27 pg (ref 27.0–33.0)
MCHC: 32.3 g/dL (ref 32.0–36.0)
MCV: 83.4 fL (ref 80.0–100.0)
MPV: 9.6 fL (ref 7.5–12.5)
PLATELETS: 312 10*3/uL (ref 140–400)
RBC: 4.71 MIL/uL (ref 3.80–5.10)
RDW: 14.7 % (ref 11.0–15.0)
WBC: 7 10*3/uL (ref 3.8–10.8)

## 2015-11-24 LAB — BASIC METABOLIC PANEL
BUN: 18 mg/dL (ref 7–25)
CALCIUM: 9.8 mg/dL (ref 8.6–10.4)
CO2: 26 mmol/L (ref 20–31)
CREATININE: 1.3 mg/dL — AB (ref 0.50–1.05)
Chloride: 104 mmol/L (ref 98–110)
GLUCOSE: 115 mg/dL — AB (ref 65–99)
Potassium: 4.2 mmol/L (ref 3.5–5.3)
Sodium: 140 mmol/L (ref 135–146)

## 2015-11-24 NOTE — Telephone Encounter (Signed)
Patient is asking for a letter excusing her from work during her surgery and recovery period. Patient was last seen today with Dr.Silva.

## 2015-11-24 NOTE — Telephone Encounter (Signed)
Letter signed. Ok to close encounter.

## 2015-11-24 NOTE — Telephone Encounter (Signed)
Patient may have a note for work that she cannot work for 6 weeks following the date of her procedure.  The note also needs to say she cannot lift, carry, pull, or haul anything over 10 pounds for 3 months from the date of her surgery.   I am not certain if we have already filled out disability paperwork, so I will also sent this to our department that handles this paperwork.  Cc- Lerry Liner

## 2015-11-24 NOTE — Progress Notes (Signed)
GYNECOLOGY  VISIT   HPI: 60 y.o.   Single  African American  female   [id number] with Patient's last menstrual period was 06/27/2011 (approximate).   here for  Post op.  Not having a lot of pain.  Walking at home.  Eating well.  BMs working well.  Vaginal spotting only.   GYNECOLOGIC HISTORY: Patient's last menstrual period was 06/27/2011 (approximate). Contraception:  hysterectomy Menopausal hormone therapy:  n/a Last mammogram:  5/17 category c birads 1:neg Last pap smear:   3/17        OB History    Gravida Para Term Preterm AB TAB SAB Ectopic Multiple Living   2 1 1  1  0    1         Patient Active Problem List   Diagnosis Date Noted  . Status post total abdominal hysterectomy and bilateral salpingo-oophorectomy 11/16/2015  . Healthcare maintenance 10/28/2015  . Prediabetes   . Essential hypertension 08/19/2015  . Uterine prolapse 08/19/2015  . Syncope and collapse 08/11/2015  . Hypokalemia 08/11/2015  . Orthostatic hypotension 08/11/2015  . Vertigo 08/11/2015  . BMI 31.0-31.9,adult 06/12/2015  . Elevated blood pressure 06/12/2015  . Tinea corporis 06/12/2015  . Pre-diabetes 06/12/2015  . 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 Urinary frequency 10/28/2014  . Urinary incontinence 10/28/2014  . Vaginal itching 10/28/2014  . Hyperlipidemia 05/18/2010  . OVERWEIGHT 05/18/2010  . PRECORDIAL PAIN 05/18/2010  . ABNORMAL STRESS ELECTROCARDIOGRAM 05/18/2010    Past Medical History  Diagnosis Date  . Hyperlipidemia   . 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 Urinary frequency 10/28/2014  . Urinary incontinence 10/28/2014  . Herpes simplex virus (HSV) infection   . Vaginal itching 10/28/2014  . Fibroid   . Vertigo   . Prediabetes   . Diabetes mellitus without complication ([place])     diet controlled    Past Surgical History  Procedure Laterality Date  . Tubal ligation    . Breast biopsy    . Wisdom tooth extraction    . Abdominal hysterectomy N/A 11/16/2015     Procedure: TOTAL ABDOMINAL HYSTERECTOMY;  Surgeon: Salvadore Dom, MD;  Location: [facility];  Service: Gynecology;  Laterality: N/A;  . Salpingoophorectomy Bilateral 11/16/2015    Procedure: BILATERAL SALPINGO OOPHORECTOMY;  Surgeon: Salvadore Dom, MD;  Location: [facility];  Service: Gynecology;  Laterality: Bilateral;  . Abdominal sacrocolpopexy N/A 11/16/2015    Procedure: ABDOMINO SACROCOLPOPEXY HALBAN'S CULDOPLASTY ;  Surgeon: Nunzio Cobbs, MD;  Location: [facility];  Service: Gynecology;  Laterality: N/A;  . Anterior and posterior repair N/A 11/16/2015    Procedure: ANTERIOR (CYSTOCELE) AND PERINEORRHAPHY;  Surgeon: Nunzio Cobbs, MD;  Location: [facility];  Service: Gynecology;  Laterality: N/A;  . Bladder suspension N/A 11/16/2015    Procedure: TRANSVAGINAL TAPE (TVT) PROCEDURE MED URETHRAL SLING ;  Surgeon: Nunzio Cobbs, MD;  Location: [facility];  Service: Gynecology;  Laterality: N/A;  . Cystoscopy N/A 11/16/2015    Procedure: CYSTOSCOPY;  Surgeon: Nunzio Cobbs, MD;  Location: [facility];  Service: Gynecology;  Laterality: N/A;    Current Outpatient Prescriptions  Medication Sig Dispense Refill  . ciprofloxacin (CIPRO) 250 MG tablet Take 1 tablet (250 mg total) by mouth 2 (two) times daily. 14 tablet 0  . ferrous sulfate 325 (65 FE) MG EC tablet Take one tablet 325 mg once daily. 30 tablet 1  . meclizine (ANTIVERT) 25 MG tablet Take 25 mg by mouth 3 (  three) times daily as needed for dizziness. Reported on 08/30/2015    . oxyCODONE-acetaminophen (PERCOCET/ROXICET) 5-325 MG tablet Take 1-2 tablets by mouth every 4 (four) hours as needed for severe pain (moderate to severe pain (when tolerating fluids)). 30 tablet 0   No current facility-administered medications for this visit.     ALLERGIES: Review of patient's allergies indicates no known allergies.  Family History  Problem Relation Age of Onset  . Heart failure Mother   . Diabetes Mother   .  Aneurysm Father   . Diabetes Maternal Grandmother   . Diabetes Other     mom's siblings, maternal cousins    Social History   Social History  . Marital Status: Single    Spouse Name: N/A  . Number of Children: N/A  . Years of Education: N/A   Occupational History  . Works at [employer]  . Smoking status: Never Smoker   . Smokeless tobacco: Never Used  . Alcohol Use: No  . Drug Use: No  . Sexual Activity: No     Comment: tubal/postmenopausal   Other Topics Concern  . Not on file   Social History Narrative   Single    ROS:  Pertinent items are noted in HPI.  PHYSICAL EXAMINATION:    Ht 5' 5.5" (1.664 m)  LMP 06/27/2011 (Approximate)    General appearance: alert, cooperative and appears stated age.  Smiling.   Abdomen: incision(s):Yes.  , _____Pfannenstiel intact with Dermabond_______  soft, non-tender, no masses,  no organomegaly    Pelvic: External genitalia:  no lesions              Urethra:  normal appearing urethra with no masses, tenderness or lesions  Foley cath removed now.                Bimanual Exam:   Good vaginal support.  Sutures of anterior, apical, and posterior vaginal Theroux intact.   Mesh and sling protected.   Void 200 cc. Sterile cath with betadine and self cath tip after verbal permission.  PVR 115 cc.  Chaperone was present for exam.  ASSESSMENT  Status post TAH/BSO/sacrocolpopexy/Halban's culdoplasty/Anterior colporrhaphy/TVT Exact midurethral sling and cystoscopy/perineorrhaphy.  Post op urinary retention.  Voiding adequately now.  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 Doing well post op overall.   PLAN  Will check CBC and BMP now.  Continue decreased activity.  Void q 3 hours. Follow up in 6 weeks.  Call for any voiding problems.   An After Visit Summary was printed and given to the patient.

## 2015-11-24 NOTE — Telephone Encounter (Signed)
Dr.Silva, please review and advise. Patient had surgery on 11/16/2015. Requesting a note excusing her from work and during her recovery period. Is there a date we can write the letter through at this time?

## 2015-11-24 NOTE — Telephone Encounter (Signed)
Patient dropped off a Short Term Disability and form to be completed at check out today. Patient to call tomorrow with form completion fee.

## 2015-11-24 NOTE — Telephone Encounter (Signed)
Spoke with patient. Advised letter has been written/signed and is ready for pick up. She is agreeable and will pick up this letter when she picks up her disability paperwork from the office. Letter to [employer] to keep with her other forms. Would like to me to let Dr.Silva know she is using the restroom without problems and feels great.  Cc: Lerry Liner  Routing to provider for final review. Patient agreeable to disposition. Will close encounter.

## 2015-11-24 NOTE — Telephone Encounter (Signed)
Letter written and to Dr.Silva for review and signature prior to sending.

## 2015-11-26 DIAGNOSIS — Z0289 Encounter for other administrative examinations: Secondary | ICD-10-CM

## 2015-12-02 NOTE — Telephone Encounter (Signed)
Routing to provider for final review. Patient agreeable to disposition. Will close encounter.     

## 2015-12-29 ENCOUNTER — Encounter: Payer: Self-pay | Admitting: Obstetrics and Gynecology

## 2015-12-29 ENCOUNTER — Ambulatory Visit (INDEPENDENT_AMBULATORY_CARE_PROVIDER_SITE_OTHER): Payer: BLUE CROSS/BLUE SHIELD | Admitting: Obstetrics and Gynecology

## 2015-12-29 VITALS — BP 130/78 | HR 64 | Resp 14 | Wt 187.0 lb

## 2015-12-29 DIAGNOSIS — N289 Disorder of kidney and ureter, unspecified: Secondary | ICD-10-CM | POA: Diagnosis not present

## 2015-12-29 DIAGNOSIS — Z90710 Acquired absence of both cervix and uterus: Secondary | ICD-10-CM

## 2015-12-29 LAB — CREATININE, SERUM: CREATININE: 1.32 mg/dL — AB (ref 0.50–1.05)

## 2015-12-29 NOTE — Progress Notes (Signed)
Patient ID: Heather Leach, female   DOB: 1955/12/01, 60 y.o.   MRN: [id number] GYNECOLOGY  VISIT   HPI: 60 y.o.   Single  African American  female   [id number] with Patient's last menstrual period was 06/27/2011 (approximate).   here for  Follow up, she is status post TAH/BSO/sacrocolpopexy/Halban's culdoplasty/Anterior colporrhaphy/TVT Exact midurethral sling and cystoscopy/perineorrhaphy.She is 6 weeks post op, she is doing well. Voiding fine. She c/o a slight discharge, slightly brown, light. No itching, burning or irritation. Normal BM's  GYNECOLOGIC HISTORY: Patient's last menstrual period was 06/27/2011 (approximate). Contraception:hysterectomy Menopausal hormone therapy: none         OB History    Gravida Para Term Preterm AB TAB SAB Ectopic Multiple Living   2 1 1  1  0    1         Patient Active Problem List   Diagnosis Date Noted  . Status post total abdominal hysterectomy and bilateral salpingo-oophorectomy 11/16/2015  . Healthcare maintenance 10/28/2015  . Prediabetes   . Essential hypertension 08/19/2015  . Uterine prolapse 08/19/2015  . Syncope and collapse 08/11/2015  . Hypokalemia 08/11/2015  . Orthostatic hypotension 08/11/2015  . Vertigo 08/11/2015  . BMI 31.0-31.9,adult 06/12/2015  . Elevated blood pressure 06/12/2015  . Tinea corporis 06/12/2015  . Pre-diabetes 06/12/2015  . 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 Urinary frequency 10/28/2014  . Urinary incontinence 10/28/2014  . Vaginal itching 10/28/2014  . Hyperlipidemia 05/18/2010  . OVERWEIGHT 05/18/2010  . PRECORDIAL PAIN 05/18/2010  . ABNORMAL STRESS ELECTROCARDIOGRAM 05/18/2010    Past Medical History  Diagnosis Date  . Hyperlipidemia   . 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 Urinary frequency 10/28/2014  . Urinary incontinence 10/28/2014  . Herpes simplex virus (HSV) infection   . Vaginal itching 10/28/2014  . Fibroid   . Vertigo   . Prediabetes   . Diabetes mellitus without complication ([place])      diet controlled    Past Surgical History  Procedure Laterality Date  . Tubal ligation    . Breast biopsy    . Wisdom tooth extraction    . Abdominal hysterectomy N/A 11/16/2015    Procedure: TOTAL ABDOMINAL HYSTERECTOMY;  Surgeon: Salvadore Dom, MD;  Location: [facility];  Service: Gynecology;  Laterality: N/A;  . Salpingoophorectomy Bilateral 11/16/2015    Procedure: BILATERAL SALPINGO OOPHORECTOMY;  Surgeon: Salvadore Dom, MD;  Location: [facility];  Service: Gynecology;  Laterality: Bilateral;  . Abdominal sacrocolpopexy N/A 11/16/2015    Procedure: ABDOMINO SACROCOLPOPEXY HALBAN'S CULDOPLASTY ;  Surgeon: Nunzio Cobbs, MD;  Location: [facility];  Service: Gynecology;  Laterality: N/A;  . Anterior and posterior repair N/A 11/16/2015    Procedure: ANTERIOR (CYSTOCELE) AND PERINEORRHAPHY;  Surgeon: Nunzio Cobbs, MD;  Location: [facility];  Service: Gynecology;  Laterality: N/A;  . Bladder suspension N/A 11/16/2015    Procedure: TRANSVAGINAL TAPE (TVT) PROCEDURE MED URETHRAL SLING ;  Surgeon: Nunzio Cobbs, MD;  Location: [facility];  Service: Gynecology;  Laterality: N/A;  . Cystoscopy N/A 11/16/2015    Procedure: CYSTOSCOPY;  Surgeon: Nunzio Cobbs, MD;  Location: [facility];  Service: Gynecology;  Laterality: N/A;    Current Outpatient Prescriptions  Medication Sig Dispense Refill  . ferrous sulfate 325 (65 FE) MG EC tablet Take one tablet 325 mg once daily. 30 tablet 1   No current facility-administered medications for this visit.     ALLERGIES: Review of patient's allergies indicates no known  allergies.  Family History  Problem Relation Age of Onset  . Heart failure Mother   . Diabetes Mother   . Aneurysm Father   . Diabetes Maternal Grandmother   . Diabetes Other     mom's siblings, maternal cousins    Social History   Social History  . Marital Status: Single    Spouse Name: N/A  . Number of Children: N/A  . Years of Education: N/A    Occupational History  . Works at [employer]  . Smoking status: Never Smoker   . Smokeless tobacco: Never Used  . Alcohol Use: No  . Drug Use: No  . Sexual Activity: No     Comment: tubal/hysterectomy   Other Topics Concern  . Not on file   Social History Narrative   Single    Review of Systems  Constitutional: Negative.   HENT: Negative.   Eyes: Negative.   Respiratory: Negative.   Cardiovascular: Negative.   Gastrointestinal: Negative.   Genitourinary: Negative.   Musculoskeletal: Negative.   Skin: Negative.   Neurological: Negative.   Endo/Heme/Allergies: Negative.   Psychiatric/Behavioral: Negative.     PHYSICAL EXAMINATION:    BP 130/78 mmHg  Pulse 64  Resp 14  Wt 187 lb (84.823 kg)  LMP 06/27/2011 (Approximate)    General appearance: alert, cooperative and appears stated age Abdomen: soft, non-tender; bowel sounds normal; no masses,  no organomegaly Incision: well healed  Pelvic: External genitalia:  no lesions              Urethra:  normal appearing urethra with no masses, tenderness or lesions              Bartholins and Skenes: normal                 Vagina: normal appearing vagina with normal color and discharge, no lesions. Cuff with slightly friable tissue on the left, treated with silver nitrate. Healing well              Bimanual Exam: no masses or tenderness.   Vagina is well supported. No prolapse  Chaperone was present for exam.  ASSESSMENT 6 weeks status post TAH/BSO/sacrocolpopexy/Halban's culdoplasty/Anterior colporrhaphy/TVT Exact midurethral sling and cystoscopy/perineorrhaphy. 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

## 2016-01-03 ENCOUNTER — Telehealth: Payer: Self-pay | Admitting: Obstetrics and Gynecology

## 2016-01-03 NOTE — Telephone Encounter (Signed)
Patient called regarding [employer] Short Term Disability Form, form was completed on 12/01/15 and given to the patient in the office at that time.  Patient states she was advised she would return to work on 12/28/15 with restrictions, as noted in letter on file from Dr Quincy Simmonds. Patient states her employer is unable to provide work for her with restrictions and her FMLA has approved her to out of work the full 12 weeks. However, patient requested the information regarding the restrictions be documented and sent to [employer].  I added an addendum, today, to the [employer] form, noting restictions and if employer is unable to accommodate the restrictions, patient would need to be out of work the full 12 weeks, until 02/08/16.  Form faxed to [place] at [phone] referencing [employer] claim number [id number].  Routing to Dr Quincy Simmonds for final review Cc: Lamont Snowball

## 2016-01-03 NOTE — Telephone Encounter (Signed)
Forms reviewed with Lerry Liner. Encounter closed.

## 2016-01-03 NOTE — Telephone Encounter (Signed)
I agree with these recommendations/guidelines for the patient's post op recovery.

## 2016-02-07 ENCOUNTER — Ambulatory Visit (INDEPENDENT_AMBULATORY_CARE_PROVIDER_SITE_OTHER): Payer: BLUE CROSS/BLUE SHIELD | Admitting: Obstetrics and Gynecology

## 2016-02-07 ENCOUNTER — Encounter: Payer: Self-pay | Admitting: Obstetrics and Gynecology

## 2016-02-07 VITALS — BP 144/80 | HR 68 | Ht 65.5 in | Wt 197.0 lb

## 2016-02-07 DIAGNOSIS — N951 Menopausal and female climacteric states: Secondary | ICD-10-CM

## 2016-02-07 DIAGNOSIS — Z9889 Other specified postprocedural states: Secondary | ICD-10-CM

## 2016-02-07 DIAGNOSIS — R232 Flushing: Secondary | ICD-10-CM

## 2016-02-07 NOTE — Patient Instructions (Signed)
Menopause and Herbal Products WHAT IS MENOPAUSE? Menopause is the normal time of life when menstrual periods decrease in frequency and eventually stop completely. This process can take several years for some women. Menopause is complete when you have had an absence of menstruation for a full year since your last menstrual period. It usually occurs between the ages of 48 and 55. It is not common for menopause to begin before the age of 40. During menopause, your body stops producing the female hormones estrogen and progesterone. Common symptoms associated with this loss of hormones (vasomotor symptoms) are:  Hot flashes.  Hot flushes.  Night sweats. Other common symptoms and complications of menopause include:  Decrease in sex drive.  Vaginal dryness and thinning of the walls of the vagina. This can make sex painful.  Dryness of the skin and development of wrinkles.  Headaches.  Tiredness.  Irritability.  Memory problems.  Weight gain.  Bladder infections.  Hair growth on the face and chest.  Inability to reproduce offspring (infertility).  Loss of density in the bones (osteoporosis) increasing your risk for breaks (fractures).  Depression.  Hardening and narrowing of the arteries (atherosclerosis). This increases your risk of heart attack and stroke. WHAT TREATMENT OPTIONS ARE AVAILABLE? There are many treatment choices for menopause symptoms. The most common treatment is hormone replacement therapy. Many alternative therapies for menopause are emerging, including the use of herbal products. These supplements can be found in the form of herbs, teas, oils, tinctures, and pills. Common herbal supplements for menopause are made from plants that contain phytoestrogens. Phytoestrogens are compounds that occur naturally in plants and plant products. They act like estrogen in the body. Foods and herbs that contain phytoestrogens include:  Soy.  Flax seeds.  Red  clover.  Ginseng. WHAT MENOPAUSE SYMPTOMS MAY BE HELPED IF I USE HERBAL PRODUCTS?  Vasomotor symptoms. These may be helped by:  Soy. Some studies show that soy may have a moderate benefit for hot flashes.  Black cohosh. There is limited evidence indicating this may be beneficial for hot flashes.  Symptoms that are related to heart and blood vessel disease. These may be helped by soy. Studies have shown that soy can help to lower cholesterol.  Depression. This may be helped by:  St. John's wort. There is limited evidence that shows this may help mild to moderate depression.  Black cohosh. There is evidence that this may help depression and mood swings.  Osteoporosis. Soy may help to decrease bone loss that is associated with menopause and may prevent osteoporosis. Limited evidence indicates that red clover may offer some bone loss protection as well. Other herbal products that are commonly used during menopause lack enough evidence to support their use as a replacement for conventional menopause therapies. These products include evening primrose, ginseng, and red clover. WHAT ARE THE CASES WHEN HERBAL PRODUCTS SHOULD NOT BE USED DURING MENOPAUSE? Do not use herbal products during menopause without your health care provider's approval if:  You are taking medicine.  You have a preexisting liver condition. ARE THERE ANY RISKS IN MY TAKING HERBAL PRODUCTS DURING MENOPAUSE? If you choose to use herbal products to help with symptoms of menopause, keep in mind that:  Different supplements have different and unmeasured amounts of herbal ingredients.  Herbal products are not regulated the same way that medicines are.  Concentrations of herbs may vary depending on the way they are prepared. For example, the concentration may be different in a pill, tea, oil, and tincture.    Little is known about the risks of using herbal products, particularly the risks of long-term use.  Some herbal  supplements can be harmful when combined with certain medicines. Most commonly reported side effects of herbal products are mild. However, if used improperly, many herbal supplements can cause serious problems. Talk to your health care provider before starting any herbal product. If problems develop, stop taking the supplement and let your health care provider know.   This information is not intended to replace advice given to you by your health care provider. Make sure you discuss any questions you have with your health care provider.   Document Released: 11/29/2007 Document Revised: 07/03/2014 Document Reviewed: 11/25/2013 Elsevier Interactive Patient Education 2016 Elsevier Inc.  

## 2016-02-07 NOTE — Progress Notes (Signed)
GYNECOLOGY  VISIT   HPI: 60 y.o.   Single  African American  female   [id number] with Patient's last menstrual period was 06/27/2011 (approximate).   here for 11 week follow up TOTAL ABDOMINAL HYSTERECTOMY (N/A Abdomen) BILATERAL SALPINGO OOPHORECTOMY (Bilateral Abdomen) [id number] CPT (R)] ABDOMINO SACROCOLPOPEXY HALBAN'S CULDOPLASTY (N/A Abdomen) ANTERIOR (CYSTOCELE) AND PERINEORRHAPHY (N/A Vagina ) TRANSVAGINAL TAPE (TVT) PROCEDURE MED URETHRAL SLING (N/A Vagina ) CYSTOSCOPY (N/A Urethra).  Good energy level.  A little incisional tenderness, nothing major.  No leakage of urine.  Voiding well.  Normal bowel movements.  No vaginal bleeding.  Hot flashes.  Stopped taking iron.  Hgb 12.7 on 11/24/15.  Does heavy lifting at work.   GYNECOLOGIC HISTORY: Patient's last menstrual period was 06/27/2011 (approximate). Contraception:  Hyst Menopausal hormone therapy:  none Last mammogram:  10-27-15 Density B/Poss.asymmetry in Rt.Br.Lt.Br.Neg.;11-09-15 Rt.Diag.mmg asymmetry resolves on additional imaging/Neg/[facility] Last pap smear:   08-30-15 Neg        OB History    Gravida Para Term Preterm AB Living   2 1 1   1 1    SAB TAB Ectopic Multiple Live Births     0     1         Patient Active Problem List   Diagnosis Date Noted  . Status post total abdominal hysterectomy and bilateral salpingo-oophorectomy 11/16/2015  . Healthcare maintenance 10/28/2015  . Prediabetes   . Essential hypertension 08/19/2015  . Uterine prolapse 08/19/2015  . Syncope and collapse 08/11/2015  . Hypokalemia 08/11/2015  . Orthostatic hypotension 08/11/2015  . Vertigo 08/11/2015  . BMI 31.0-31.9,adult 06/12/2015  . Elevated blood pressure 06/12/2015  . Tinea corporis 06/12/2015  . Pre-diabetes 06/12/2015  . 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 Urinary frequency 10/28/2014  . Urinary incontinence 10/28/2014  . Vaginal itching 10/28/2014  . Hyperlipidemia 05/18/2010  . OVERWEIGHT  05/18/2010  . PRECORDIAL PAIN 05/18/2010  . ABNORMAL STRESS ELECTROCARDIOGRAM 05/18/2010    Past Medical History:  Diagnosis Date  . 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 Diabetes mellitus without complication (HCC)    diet controlled  . Fibroid   . Herpes simplex virus (HSV) infection   . Hyperlipidemia   . Prediabetes   . Urinary frequency 10/28/2014  . Urinary incontinence 10/28/2014  . Vaginal itching 10/28/2014  . Vertigo     Past Surgical History:  Procedure Laterality Date  . ABDOMINAL HYSTERECTOMY N/A 11/16/2015   Procedure: TOTAL ABDOMINAL HYSTERECTOMY;  Surgeon: Salvadore Dom, MD;  Location: [facility];  Service: Gynecology;  Laterality: N/A;  . ABDOMINAL SACROCOLPOPEXY N/A 11/16/2015   Procedure: ABDOMINO SACROCOLPOPEXY HALBAN'S CULDOPLASTY ;  Surgeon: Nunzio Cobbs, MD;  Location: [facility];  Service: Gynecology;  Laterality: N/A;  . ANTERIOR AND POSTERIOR REPAIR N/A 11/16/2015   Procedure: ANTERIOR (CYSTOCELE) AND PERINEORRHAPHY;  Surgeon: Nunzio Cobbs, MD;  Location: [facility];  Service: Gynecology;  Laterality: N/A;  . BLADDER SUSPENSION N/A 11/16/2015   Procedure: TRANSVAGINAL TAPE (TVT) PROCEDURE MED URETHRAL SLING ;  Surgeon: Nunzio Cobbs, MD;  Location: [facility];  Service: Gynecology;  Laterality: N/A;  . BREAST BIOPSY    . CYSTOSCOPY N/A 11/16/2015   Procedure: CYSTOSCOPY;  Surgeon: Nunzio Cobbs, MD;  Location: [facility];  Service: Gynecology;  Laterality: N/A;  . SALPINGOOPHORECTOMY Bilateral 11/16/2015   Procedure: BILATERAL SALPINGO OOPHORECTOMY;  Surgeon: Salvadore Dom, MD;  Location: [facility];  Service: Gynecology;  Laterality: Bilateral;  . TUBAL  LIGATION    . WISDOM TOOTH EXTRACTION      Current Outpatient Prescriptions  Medication Sig Dispense Refill  . ferrous sulfate 325 (65 FE) MG EC tablet Take one tablet 325 mg once daily. (Patient not taking: Reported on 02/07/2016) 30 tablet 1   No current facility-administered  medications for this visit.      ALLERGIES: Review of patient's allergies indicates no known allergies.  Family History  Problem Relation Age of Onset  . Heart failure Mother   . Diabetes Mother   . Aneurysm Father   . Diabetes Maternal Grandmother   . Diabetes Other     mom's siblings, maternal cousins    Social History   Social History  . Marital status: Single    Spouse name: N/A  . Number of children: N/A  . Years of education: N/A   Occupational History  . Works at [employer] Topics  . Smoking status: Never Smoker  . Smokeless tobacco: Never Used  . Alcohol use No  . Drug use: No  . Sexual activity: No     Comment: tubal/hysterectomy   Other Topics Concern  . Not on file   Social History Narrative   Single    ROS:  Pertinent items are noted in HPI.  PHYSICAL EXAMINATION:    BP (!) 144/80 (BP Location: Right Arm, Patient Position: Sitting, Cuff Size: Normal)   Pulse 68   Ht 5' 5.5" (1.664 m)   Wt 197 lb (89.4 kg)   LMP 06/27/2011 (Approximate)   BMI 32.28 kg/m     General appearance: alert, cooperative and appears stated age   Abdomen: Pfannensteil incision intact, abdomen soft, non-tender, no masses,  no organomegaly    Pelvic: External genitalia:  no lesions              Urethra:  normal appearing urethra with no masses, tenderness or lesions              Bartholins and Skenes: normal                 Vagina: normal appearing vagina with normal color and discharge, no lesions.  Good support.  No vaginal cuff mesh and midurethral mesh visible or palpable.              Cervix:  Absent.                Bimanual Exam:  Uterus:   Absent.              Adnexa: no mass, fullness, tenderness              Rectal exam: Yes.  .  Confirms.              Anus:  normal sphincter tone, no lesions  Chaperone was present for exam.  ASSESSMENT  Doing well post op. Hot flashes.   PLAN  Discussed herbal remedies for  hot flashes. May return to all normal activity.  Patient may switch to another role at her work so she does not need to continue heavy lifting.  Return for annual exam yearly with Dr. Talbert Nan yearly around the anniversary of her surgery date.  Patient expressing appreciation for her care.  I am available to see her prn.    An After Visit Summary was printed and given to the patient.

## 2016-02-14 ENCOUNTER — Telehealth: Payer: Self-pay | Admitting: Pediatrics

## 2016-02-14 NOTE — Telephone Encounter (Signed)
Scheduled

## 2016-02-24 ENCOUNTER — Ambulatory Visit: Payer: BLUE CROSS/BLUE SHIELD | Admitting: Pediatrics

## 2016-02-29 ENCOUNTER — Telehealth: Payer: Self-pay | Admitting: *Deleted

## 2016-02-29 NOTE — Telephone Encounter (Signed)
Attempted to contact patient to reschedule appt for 09/06 due to Dr. Evette Doffing being out

## 2016-03-01 ENCOUNTER — Ambulatory Visit: Payer: BLUE CROSS/BLUE SHIELD | Admitting: Pediatrics

## 2016-03-06 ENCOUNTER — Ambulatory Visit: Payer: BLUE CROSS/BLUE SHIELD | Admitting: Pediatrics

## 2016-03-09 ENCOUNTER — Encounter: Payer: Self-pay | Admitting: Pediatrics

## 2016-03-09 ENCOUNTER — Ambulatory Visit (INDEPENDENT_AMBULATORY_CARE_PROVIDER_SITE_OTHER): Payer: BLUE CROSS/BLUE SHIELD | Admitting: Pediatrics

## 2016-03-09 VITALS — BP 129/75 | HR 57 | Temp 97.3°F | Ht 65.5 in | Wt 197.2 lb

## 2016-03-09 DIAGNOSIS — Z1211 Encounter for screening for malignant neoplasm of colon: Secondary | ICD-10-CM | POA: Diagnosis not present

## 2016-03-09 DIAGNOSIS — I10 Essential (primary) hypertension: Secondary | ICD-10-CM

## 2016-03-09 DIAGNOSIS — Z90710 Acquired absence of both cervix and uterus: Secondary | ICD-10-CM

## 2016-03-09 DIAGNOSIS — N3946 Mixed incontinence: Secondary | ICD-10-CM

## 2016-03-09 DIAGNOSIS — Z90722 Acquired absence of ovaries, bilateral: Secondary | ICD-10-CM

## 2016-03-09 DIAGNOSIS — E785 Hyperlipidemia, unspecified: Secondary | ICD-10-CM | POA: Diagnosis not present

## 2016-03-09 DIAGNOSIS — E118 Type 2 diabetes mellitus with unspecified complications: Secondary | ICD-10-CM | POA: Diagnosis not present

## 2016-03-09 DIAGNOSIS — Z9079 Acquired absence of other genital organ(s): Secondary | ICD-10-CM

## 2016-03-09 DIAGNOSIS — E119 Type 2 diabetes mellitus without complications: Secondary | ICD-10-CM | POA: Insufficient documentation

## 2016-03-09 DIAGNOSIS — R42 Dizziness and giddiness: Secondary | ICD-10-CM | POA: Diagnosis not present

## 2016-03-09 LAB — BAYER DCA HB A1C WAIVED: HB A1C (BAYER DCA - WAIVED): 6.1 % (ref <7.0)

## 2016-03-09 NOTE — Progress Notes (Signed)
  Subjective:   Patient ID: Heather Leach, female    DOB: [birth year], 60 y.o.   MRN: [id number] CC: Follow-up multiple med problems HPI: Heather Leach is a 60 y.o. female presenting for Follow-up  Feeling much better Rarely feels a few seconds of dizziness, not persistent vertigo like before  Now s/p prolapse surgery No more urinary incontinence  Cutting out sodas, still drinking 1-2 regular mountain dews a day  Has not yet scheduled screening colonoscopy, due  DM2: has been diet controlled  CKD: avoiding NSAIDs  Relevant past medical, surgical, family and social history reviewed. Allergies and medications reviewed and updated. History  Smoking Status  . Never Smoker  Smokeless Tobacco  . Never Used   ROS: Per HPI   Objective:    BP 129/75   Pulse (!) 57   Temp 97.3 F (36.3 C) (Oral)   Ht 5' 5.5" (1.664 m)   Wt 197 lb 3.2 oz (89.4 kg)   LMP 06/27/2011 (Approximate)   BMI 32.32 kg/m   Wt Readings from Last 3 Encounters:  03/09/16 197 lb 3.2 oz (89.4 kg)  02/07/16 197 lb (89.4 kg)  12/29/15 187 lb (84.8 kg)    Gen: NAD, alert, cooperative with exam, NCAT EYES: EOMI, no conjunctival injection, or no icterus ENT:  TMs pearly gray b/l, OP without erythema LYMPH: no cervical LAD CV: NRRR, normal S1/S2, no murmur, distal pulses 2+ b/l Resp: CTABL, no wheezes, normal WOB Abd: +BS, soft, NTND. no guarding or organomegaly Ext: No edema, warm Neuro: Alert and oriented, strength equal b/l UE and LE, coordination grossly normal MSK: normal muscle bulk  Assessment & Plan:  Heather Leach was seen today for follow-up multiple med problems  Diagnoses and all orders for this visit:  Type 2 diabetes mellitus with complication, without long-term current use of insulin ([place]) Diet controlled a1c 6.1 Cont lifestyle changes Needs eye exam, pt aware Foot exam today normal -     Microalbumin / creatinine urine ratio -     Bayer DCA Hb A1c Waived  Vertigo Symptoms much  improved  Essential hypertension Well controlle,d off of meds now  Mixed incontinence Improved since prolpase surg  Status post total abdominal hysterectomy and bilateral salpingo-oophorectomy Incision well healed  Hyperlipidemia Repeat next visit Not on meds now  Screen for colon cancer Pt has phone number to GI to call for appt, referral has been made  Follow up plan: Return in about 4 months (around 07/09/2016). Assunta Found, MD [place]

## 2016-03-10 LAB — MICROALBUMIN / CREATININE URINE RATIO
Creatinine, Urine: 112.4 mg/dL
Microalbumin, Urine: <3 ug/mL

## 2016-04-14 ENCOUNTER — Telehealth: Payer: Self-pay | Admitting: Pediatrics

## 2016-06-20 ENCOUNTER — Other Ambulatory Visit: Payer: Self-pay | Admitting: Pediatrics

## 2016-06-20 DIAGNOSIS — N10 Acute tubulo-interstitial nephritis: Secondary | ICD-10-CM

## 2016-06-22 ENCOUNTER — Ambulatory Visit: Payer: BLUE CROSS/BLUE SHIELD | Admitting: Pediatrics

## 2016-06-27 ENCOUNTER — Encounter: Payer: Self-pay | Admitting: Pediatrics

## 2016-08-04 ENCOUNTER — Encounter: Payer: Self-pay | Admitting: Pediatrics

## 2016-08-04 ENCOUNTER — Ambulatory Visit (INDEPENDENT_AMBULATORY_CARE_PROVIDER_SITE_OTHER): Payer: BLUE CROSS/BLUE SHIELD | Admitting: Pediatrics

## 2016-08-04 VITALS — BP 128/86 | HR 60 | Temp 97.0°F | Ht 65.5 in | Wt 196.0 lb

## 2016-08-04 DIAGNOSIS — Z0289 Encounter for other administrative examinations: Secondary | ICD-10-CM

## 2016-08-04 DIAGNOSIS — H65113 Acute and subacute allergic otitis media (mucoid) (sanguinous) (serous), bilateral: Secondary | ICD-10-CM

## 2016-08-04 DIAGNOSIS — H8120 Vestibular neuronitis, unspecified ear: Secondary | ICD-10-CM | POA: Diagnosis not present

## 2016-08-04 DIAGNOSIS — E118 Type 2 diabetes mellitus with unspecified complications: Secondary | ICD-10-CM | POA: Diagnosis not present

## 2016-08-04 DIAGNOSIS — E669 Obesity, unspecified: Secondary | ICD-10-CM | POA: Diagnosis not present

## 2016-08-04 DIAGNOSIS — N189 Chronic kidney disease, unspecified: Secondary | ICD-10-CM

## 2016-08-04 DIAGNOSIS — E785 Hyperlipidemia, unspecified: Secondary | ICD-10-CM | POA: Diagnosis not present

## 2016-08-04 LAB — BAYER DCA HB A1C WAIVED: HB A1C: 6 % (ref <7.0)

## 2016-08-04 MED ORDER — PREDNISONE 10 MG (21) PO TBPK: 10.0000 mg | ORAL_TABLET | Freq: Every day | ORAL | 0 refills | Status: DC

## 2016-08-04 MED ORDER — FLUTICASONE PROPIONATE 50 MCG/ACT NA SUSP: 2.0000 | Freq: Every day | NASAL | 6 refills | Status: DC

## 2016-08-04 MED ORDER — AMOXICILLIN 875 MG PO TABS: 875.0000 mg | ORAL_TABLET | Freq: Two times a day (BID) | ORAL | 0 refills | Status: DC

## 2016-08-04 NOTE — Progress Notes (Signed)
  Subjective:   Patient ID: Heather Leach, female    DOB: 10/25/1955, 61 y.o.   MRN: [id number] CC: Nasal Congestion; Headache; Dizziness; and Chills  HPI: Heather Leach is a 61 y.o. female presenting for Nasal Congestion; Headache; Dizziness; and Chills Started getting sick 4 days ago Has had vertigo since URI symptoms started No fevers Taking OTC meds, zyrtec Feels like the room is spinning when she turns her head Doesn't matter if turning right or left Does not happen unless she triggers the vertigo with turning her head  Meclizine helped last time  Eye exam done recently Dr. Radford Pax in [place] controlled Trying to avoid sugar, stay active  No headaches, no CP, no SOB  Relevant past medical, surgical, family and social history reviewed. Allergies and medications reviewed and updated. History  Smoking Status  . Never Smoker  Smokeless Tobacco  . Never Used   ROS: Per HPI   Objective:    BP 128/86   Pulse 60   Temp 97 F (36.1 C) (Oral)   Ht 5' 5.5" (1.664 m)   Wt 196 lb (88.9 kg)   LMP 06/27/2011 (Approximate)   BMI 32.12 kg/m   Wt Readings from Last 3 Encounters:  08/04/16 196 lb (88.9 kg)  03/09/16 197 lb 3.2 oz (89.4 kg)  02/07/16 197 lb (89.4 kg)   Gen: NAD, alert, cooperative with exam, NCAT EYES: EOMI, no conjunctival injection, or no icterus ENT:  TMs b/l red and bulging with opaque fluid behind ear drum LYMPH: no cervical LAD CV: NRRR, normal S1/S2, no murmur, distal pulses 2+ b/l Resp: CTABL, no wheezes, normal WOB Abd: +BS, soft, NTND. no guarding or organomegaly Ext: No edema, warm Neuro: Alert and oriented, no nystagmus  Assessment & Plan:  Chalese was seen today for nasal congestion, headache, dizziness and chills.  Diagnoses and all orders for this visit:  Acute mucoid otitis media of both ears -     fluticasone (FLONASE) 50 MCG/ACT nasal spray; Place 2 sprays into both nostrils daily. -     amoxicillin (AMOXIL) 875 MG tablet; Take 1 tablet  (875 mg total) by mouth 2 (two) times daily.  Chronic kidney disease, unspecified CKD stage Baseline Cr 1.2-1.4 -     CMP14+EGFR  Hyperlipidemia, unspecified hyperlipidemia type H/o DM2, not on statin, check labs, start statin -     Lipid panel  Type 2 diabetes mellitus with complication, without long-term current use of insulin (HCC) A1c 6, diet controlled, avoid sugar -     Bayer DCA Hb A1c Waived  Obesity (BMI 30.0-34.9) Discussed lifestyle changes -     TSH  Vestibular neuronitis, unspecified laterality Trial of below -     predniSONE (STERAPRED UNI-PAK 21 TAB) 10 MG (21) TBPK tablet; Take 1 tablet (10 mg total) by mouth daily. As directed x 6 days -     meclizine (ANTIVERT) 25 MG tablet; Take 1 tablet (25 mg total) by mouth 3 (three) times daily as needed for dizziness.   Follow up plan: Return in about 3 months (around 11/01/2016) for complete physical. Assunta Found, MD [place]

## 2016-08-05 LAB — LIPID PANEL
CHOLESTEROL TOTAL: 218 mg/dL — AB (ref 100–199)
Chol/HDL Ratio: 5.5 ratio units — ABNORMAL HIGH (ref 0.0–4.4)
HDL: 40 mg/dL (ref >39)
LDL Calculated: 151 mg/dL — ABNORMAL HIGH (ref 0–99)
TRIGLYCERIDES: 137 mg/dL (ref 0–149)
VLDL Cholesterol Cal: 27 mg/dL (ref 5–40)

## 2016-08-05 LAB — CMP14+EGFR
A/G RATIO: 1.6 (ref 1.2–2.2)
ALK PHOS: 77 IU/L (ref 39–117)
ALT: 12 IU/L (ref 0–32)
AST: 13 IU/L (ref 0–40)
Albumin: 4.2 g/dL (ref 3.5–5.5)
BUN/Creatinine Ratio: 13 (ref 9–23)
BUN: 16 mg/dL (ref 6–24)
Bilirubin Total: 0.5 mg/dL (ref 0.0–1.2)
CALCIUM: 9.4 mg/dL (ref 8.7–10.2)
CHLORIDE: 104 mmol/L (ref 96–106)
CO2: 21 mmol/L (ref 18–29)
Creatinine, Ser: 1.23 mg/dL — ABNORMAL HIGH (ref 0.57–1.00)
GFR calc Af Amer: 55 mL/min/{1.73_m2} — ABNORMAL LOW (ref >59)
GFR calc non Af Amer: 48 mL/min/{1.73_m2} — ABNORMAL LOW (ref >59)
GLOBULIN, TOTAL: 2.7 g/dL (ref 1.5–4.5)
Glucose: 98 mg/dL (ref 65–99)
POTASSIUM: 4.4 mmol/L (ref 3.5–5.2)
SODIUM: 143 mmol/L (ref 134–144)
Total Protein: 6.9 g/dL (ref 6.0–8.5)

## 2016-08-05 LAB — TSH: TSH: 1.17 u[IU]/mL (ref 0.450–4.500)

## 2016-08-06 MED ORDER — MECLIZINE HCL 25 MG PO TABS: 25.0000 mg | ORAL_TABLET | Freq: Three times a day (TID) | ORAL | 0 refills | Status: DC | PRN

## 2016-08-16 ENCOUNTER — Other Ambulatory Visit: Payer: Self-pay

## 2016-08-16 MED ORDER — PRAVASTATIN SODIUM 40 MG PO TABS: 40.0000 mg | ORAL_TABLET | Freq: Every day | ORAL | 1 refills | Status: DC

## 2016-11-01 ENCOUNTER — Encounter: Payer: BLUE CROSS/BLUE SHIELD | Admitting: Pediatrics

## 2016-11-09 ENCOUNTER — Encounter: Payer: Self-pay | Admitting: Pediatrics

## 2016-11-09 ENCOUNTER — Ambulatory Visit (INDEPENDENT_AMBULATORY_CARE_PROVIDER_SITE_OTHER): Payer: BLUE CROSS/BLUE SHIELD | Admitting: Pediatrics

## 2016-11-09 VITALS — BP 119/76 | HR 55 | Temp 97.8°F | Ht 65.5 in | Wt 195.8 lb

## 2016-11-09 DIAGNOSIS — Z0000 Encounter for general adult medical examination without abnormal findings: Secondary | ICD-10-CM

## 2016-11-09 DIAGNOSIS — Z1211 Encounter for screening for malignant neoplasm of colon: Secondary | ICD-10-CM

## 2016-11-09 DIAGNOSIS — E118 Type 2 diabetes mellitus with unspecified complications: Secondary | ICD-10-CM

## 2016-11-09 DIAGNOSIS — H8120 Vestibular neuronitis, unspecified ear: Secondary | ICD-10-CM

## 2016-11-09 DIAGNOSIS — E785 Hyperlipidemia, unspecified: Secondary | ICD-10-CM

## 2016-11-09 MED ORDER — MECLIZINE HCL 25 MG PO TABS: 25.0000 mg | ORAL_TABLET | Freq: Three times a day (TID) | ORAL | 1 refills | Status: DC | PRN

## 2016-11-09 NOTE — Progress Notes (Signed)
  Subjective:   Patient ID: Heather Leach, female    DOB: 03-24-1956, 61 y.o.   MRN: [id number] CC: Annual Exam (NO PAP)  HPI: Heather Leach is a 60 y.o. female presenting for Annual Exam (NO PAP)  Colon ca screen: never had colonoscopy No fam h/o colon cancer  Last mammogram a year ago, no fam hx of breast cancer Has been getting yearly mammograms  Rarely feels the dizziness, last was over 3 mo ago Has taken meclizine when she does and helps with symptoms Wants to keep some of the meclizine around for that purpose No lightheadedness Very physical job No chest pain or chest pressure with exertion Drinking lots of fluids  HLD: doing fine on statin  Having a hard time sleeping because of thinking about work Works 2nd shift, gets home around midnight, to bed around 130am, up at 7am, sometimes stays in bed until 10am Doesn't think she is feeling anxious  Elevated BMI Decreasing soda intake, more lemonade and water Open to walking more regularly  DM2: diet controlled  H/o abnormal stress test in 2012 per chart review, not able to see results Pt says she has not needed to follow up since then with cardiology, very active now with no CP, SOB, nausea  Relevant past medical, surgical, family and social history reviewed. Allergies and medications reviewed and updated. History  Smoking Status  . Never Smoker  Smokeless Tobacco  . Never Used   ROS: All systems negative other than what is in HPI  Objective:    BP 119/76   Pulse (!) 55   Temp 97.8 F (36.6 C) (Oral)   Ht 5' 5.5" (1.664 m)   Wt 195 lb 12.8 oz (88.8 kg)   LMP 06/27/2011 (Approximate)   BMI 32.09 kg/m   Wt Readings from Last 3 Encounters:  11/09/16 195 lb 12.8 oz (88.8 kg)  08/04/16 196 lb (88.9 kg)  03/09/16 197 lb 3.2 oz (89.4 kg)    Gen: NAD, alert, cooperative with exam, NCAT EYES: EOMI, no conjunctival injection, or no icterus ENT:  R TM slightly opaque, nl LR, L TM nl, OP without erythema LYMPH: no  cervical LAD CV: NRRR, normal S1/S2, no murmur, distal pulses 2+ b/l Resp: CTABL, no wheezes, normal WOB Abd: +BS, soft, NTND. no guarding or organomegaly Ext: No edema, warm Neuro: Alert and oriented, strength equal b/l UE and LE, coordination grossly normal MSK: normal muscle bulk  Assessment & Plan:  Heather Leach was seen today for annual exam.  Diagnoses and all orders for this visit:  Encounter for preventive health examination Due for colonoscopy, mammogram  Type 2 diabetes mellitus with complication, without long-term current use of insulin ([place]) Well controlled with diet Recheck A1c next visit  Hyperlipidemia, unspecified hyperlipidemia type Now on pravastatin 40mg , rechecl lipid panel next visit  Vestibular neuronitis, unspecified laterality Rarely has flares Does not think she feels lightheaded/about to faint with episodes No heart palpitations -     meclizine (ANTIVERT) 25 MG tablet; Take 1 tablet (25 mg total) by mouth 3 (three) times daily as needed for dizziness.  Colon cancer screening -     Ambulatory referral to Gastroenterology   Follow up plan: 4 mo Assunta Found, MD [place]

## 2016-11-13 ENCOUNTER — Encounter (INDEPENDENT_AMBULATORY_CARE_PROVIDER_SITE_OTHER): Payer: Self-pay | Admitting: *Deleted

## 2016-11-15 ENCOUNTER — Ambulatory Visit: Payer: BLUE CROSS/BLUE SHIELD | Admitting: Obstetrics and Gynecology

## 2016-11-21 ENCOUNTER — Encounter: Payer: Self-pay | Admitting: Obstetrics and Gynecology

## 2016-11-21 ENCOUNTER — Ambulatory Visit (INDEPENDENT_AMBULATORY_CARE_PROVIDER_SITE_OTHER): Payer: BLUE CROSS/BLUE SHIELD | Admitting: Obstetrics and Gynecology

## 2016-11-21 VITALS — BP 118/60 | HR 56 | Resp 16 | Ht 64.5 in | Wt 195.0 lb

## 2016-11-21 DIAGNOSIS — Z1211 Encounter for screening for malignant neoplasm of colon: Secondary | ICD-10-CM

## 2016-11-21 DIAGNOSIS — Z01419 Encounter for gynecological examination (general) (routine) without abnormal findings: Secondary | ICD-10-CM

## 2016-11-21 DIAGNOSIS — N8111 Cystocele, midline: Secondary | ICD-10-CM

## 2016-11-21 NOTE — Progress Notes (Signed)
61 y.o. G2P1011 SingleAfrican AmericanF here for annual exam.  She is doing well. Voiding great since her surgery. 1-2 soft BM's a day, she c/o some abdominal pain s/p BM for the last 6 months. Every once in a while she see's a little bright red blood when she wipes. No bumps, no pain with BM.  Not sexually active.  She is status post TAH/BSO/sacrocolpopexy/Halban's culdoplasty/Anterior colporrhaphy/TVT Exact midurethral sling and cystoscopy/perineorrhaphy    Patient's last menstrual period was 06/27/2011 (approximate).          Sexually active: No.  The current method of family planning is hysterectomy.    Exercising: No.  The patient does not participate in regular exercise at present. Smoker:  no  Health Maintenance: Pap:  08-30-15 WNL  10-28-14 WNL  History of abnormal Pap:  no MMG:  11-09-15 WNL  Colonoscopy:  Never BMD:  2016 WNL per patient   TDaP:  10-13-09  Gardasil: N/A   reports that she has never smoked. She has never used smokeless tobacco. She reports that she does not drink alcohol or use drugs.Son and 3 grandchildren (3-12) are local. 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  Past Medical History:  Diagnosis Date  . 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 Diabetes mellitus without complication (HCC)    diet controlled  . Fibroid   . Herpes simplex virus (HSV) infection   . Hyperlipidemia   . Prediabetes   . Urinary frequency 10/28/2014  . Urinary incontinence 10/28/2014  . Vaginal itching 10/28/2014  . Vertigo     Past Surgical History:  Procedure Laterality Date  . ABDOMINAL HYSTERECTOMY N/A 11/16/2015   Procedure: TOTAL ABDOMINAL HYSTERECTOMY;  Surgeon: Salvadore Dom, MD;  Location: [facility];  Service: Gynecology;  Laterality: N/A;  . ABDOMINAL SACROCOLPOPEXY N/A 11/16/2015   Procedure: ABDOMINO SACROCOLPOPEXY HALBAN'S CULDOPLASTY ;  Surgeon: Nunzio Cobbs, MD;  Location: [facility];  Service: Gynecology;  Laterality: N/A;  .  ANTERIOR AND POSTERIOR REPAIR N/A 11/16/2015   Procedure: ANTERIOR (CYSTOCELE) AND PERINEORRHAPHY;  Surgeon: Nunzio Cobbs, MD;  Location: [facility];  Service: Gynecology;  Laterality: N/A;  . BLADDER SUSPENSION N/A 11/16/2015   Procedure: TRANSVAGINAL TAPE (TVT) PROCEDURE MED URETHRAL SLING ;  Surgeon: Nunzio Cobbs, MD;  Location: [facility];  Service: Gynecology;  Laterality: N/A;  . BREAST BIOPSY    . CYSTOSCOPY N/A 11/16/2015   Procedure: CYSTOSCOPY;  Surgeon: Nunzio Cobbs, MD;  Location: [facility];  Service: Gynecology;  Laterality: N/A;  . SALPINGOOPHORECTOMY Bilateral 11/16/2015   Procedure: BILATERAL SALPINGO OOPHORECTOMY;  Surgeon: Salvadore Dom, MD;  Location: [facility];  Service: Gynecology;  Laterality: Bilateral;  . TUBAL LIGATION    . WISDOM TOOTH EXTRACTION      Current Outpatient Prescriptions  Medication Sig Dispense Refill  . meclizine (ANTIVERT) 25 MG tablet Take 1 tablet (25 mg total) by mouth 3 (three) times daily as needed for dizziness. 30 tablet 1  . pravastatin (PRAVACHOL) 40 MG tablet Take 1 tablet (40 mg total) by mouth daily. 90 tablet 1   No current facility-administered medications for this visit.     Family History  Problem Relation Age of Onset  . Heart failure Mother   . Diabetes Mother   . Aneurysm Father   . Diabetes Maternal Grandmother   . Diabetes Other        mom's siblings, maternal cousins  Review of Systems  Constitutional: Negative.   HENT: Negative.   Eyes: Negative.   Respiratory: Negative.   Cardiovascular: Negative.   Gastrointestinal: Negative.   Endocrine: Negative.   Genitourinary: Negative.   Musculoskeletal: Negative.   Skin: Negative.   Allergic/Immunologic: Negative.   Neurological: Negative.   Psychiatric/Behavioral: Negative.     Exam:   BP 118/60 (BP Location: Right Arm, Patient Position: Sitting, Cuff Size: Normal)   Pulse (!) 56   Resp 16   Ht 5' 4.5" (1.638 m)   Wt 195 lb (88.5  kg)   LMP 06/27/2011 (Approximate)   BMI 32.95 kg/m   Weight change: @WEIGHTCHANGE @ Height:   Height: 5' 4.5" (163.8 cm)  Ht Readings from Last 3 Encounters:  11/21/16 5' 4.5" (1.638 m)  11/09/16 5' 5.5" (1.664 m)  08/04/16 5' 5.5" (1.664 m)    General appearance: alert, cooperative and appears stated age Head: Normocephalic, without obvious abnormality, atraumatic Neck: no adenopathy, supple, symmetrical, trachea midline and thyroid normal to inspection and palpation Lungs: clear to auscultation bilaterally Cardiovascular: regular rate and rhythm Breasts: normal appearance, no masses or tenderness Abdomen: soft, non-tender; bowel sounds normal; no masses,  no organomegaly Extremities: extremities normal, atraumatic, no cyanosis or edema Skin: Skin color, texture, turgor normal. No rashes or lesions Lymph nodes: Cervical, supraclavicular, and axillary nodes normal. No abnormal inguinal nodes palpated Neurologic: Grossly normal   Pelvic: External genitalia:  no lesions              Urethra:  normal appearing urethra with no masses, tenderness or lesions              Bartholins and Skenes: normal                 Vagina: normal appearing atrophic vagina with normal color and discharge, no lesions. She has a grade 2 cystocele with valsalva.               Cervix: absent               Bimanual Exam:  Uterus:  uterus absent              Adnexa: no mass, fullness, tenderness               Rectovaginal: Confirms               Anus:  normal sphincter tone, no lesions  Chaperone was present for exam.  A:  Well Woman with normal exam  S/P TAH/BSO/sacrocolpopexy, TVT, anterior repair  She has recurrence of her cystocele, not symptomatic (only noted with valsalva)  She is overdue for screening colonoscopy and c/o some abdominal pain after BM and occasional BRB with BM (no pain)  P:   Mammogram  No pap needed  Will set up a colonoscopy  Labs with primary   Discussed breast self  exam  Discussed calcium and vit D intake  We discussed that her job and heavy lifting aren't good for her cystocele, she is considering looking for other work  She will call with symptoms

## 2016-11-21 NOTE — Patient Instructions (Signed)

## 2016-11-24 ENCOUNTER — Telehealth: Payer: Self-pay | Admitting: Obstetrics and Gynecology

## 2016-11-24 NOTE — Telephone Encounter (Signed)
Left voicemail regarding referral appointment. The information is listed below. Should the patient need to cancel or reschedule this appointment, please advise them to call the office they've been referred to in order to reschedule.  [facility] [place]  Phone: [phone]  Dr Collene Mares 12-07-16 @ 8:45am. Please arrive 15 minutes early and bring your insurance card, photo id and list of medications.

## 2017-03-12 ENCOUNTER — Encounter: Payer: Self-pay | Admitting: Pediatrics

## 2017-03-12 ENCOUNTER — Ambulatory Visit: Payer: BLUE CROSS/BLUE SHIELD | Admitting: Pediatrics

## 2017-03-29 IMAGING — CR DG CHEST 2V
2 series · 2 of 2 positions shown · non-contrast
Comparison: None.

CLINICAL DATA: Preop for hysterectomy

EXAM:
CHEST  2 VIEW

[view not recorded (1 of 2)]
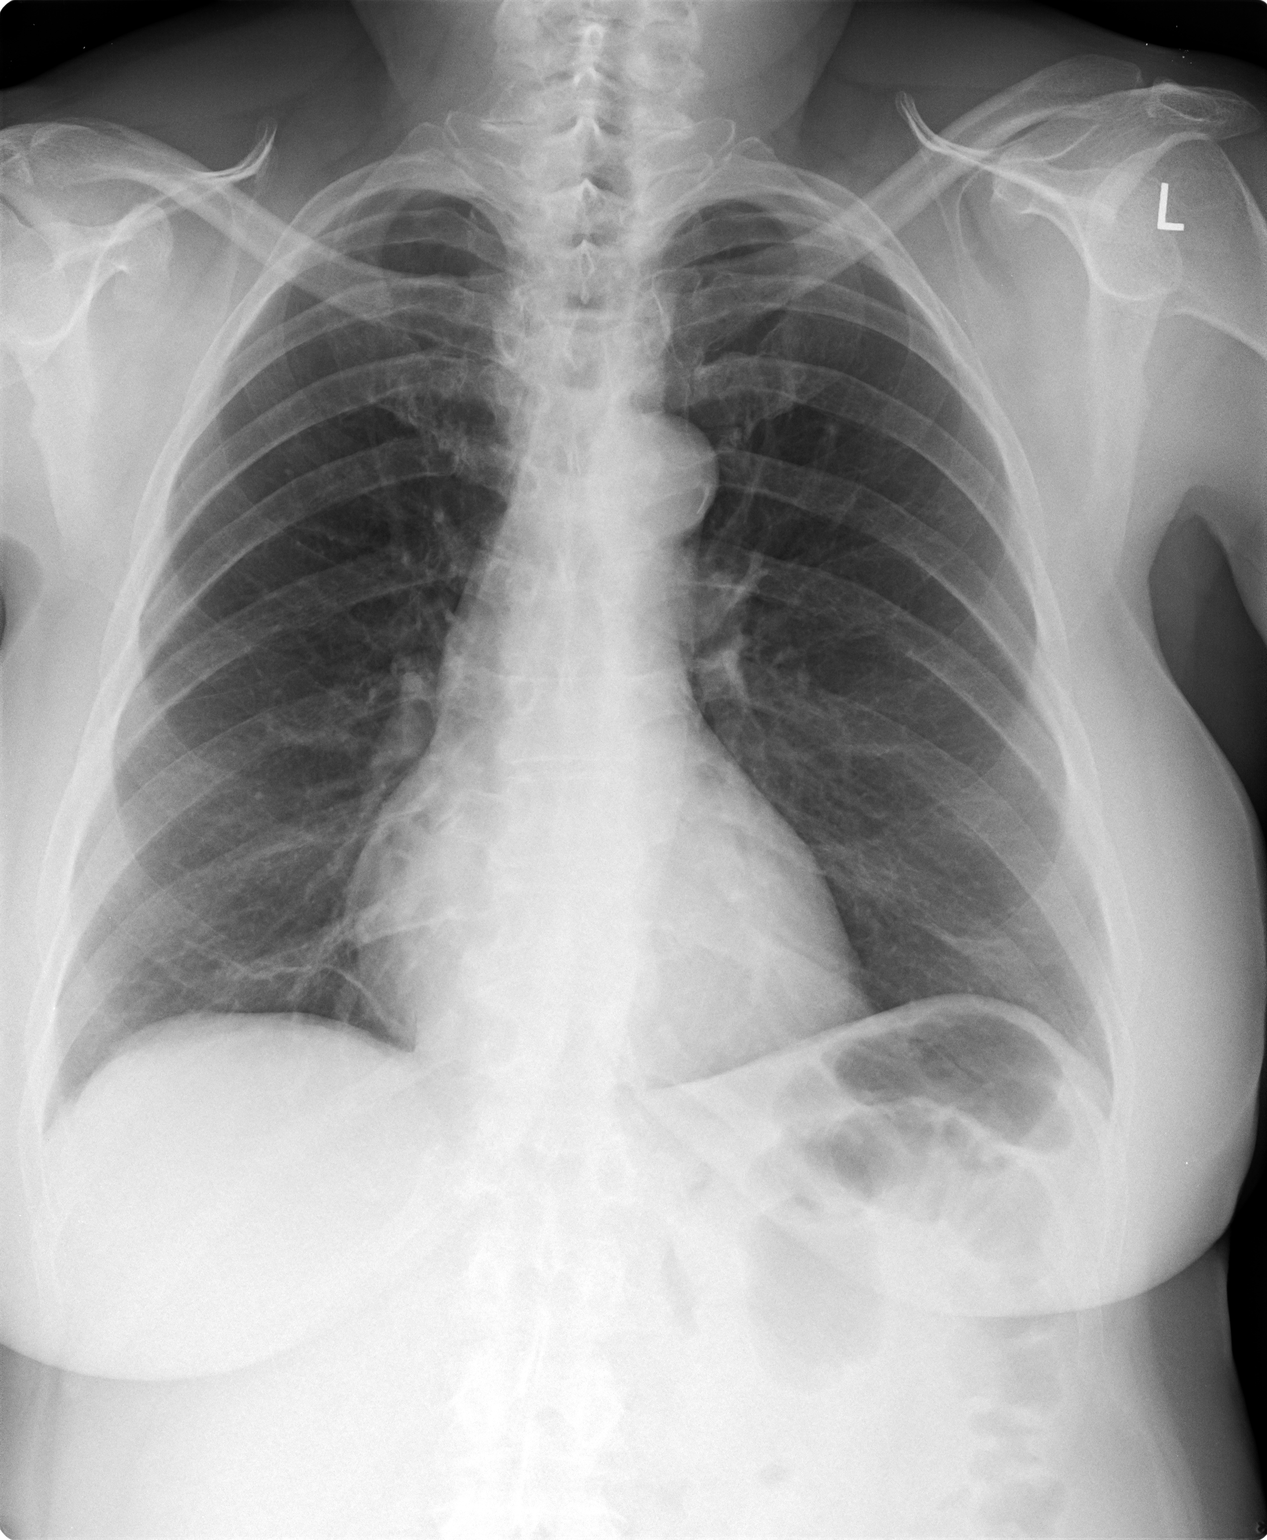

[view not recorded (2 of 2)]
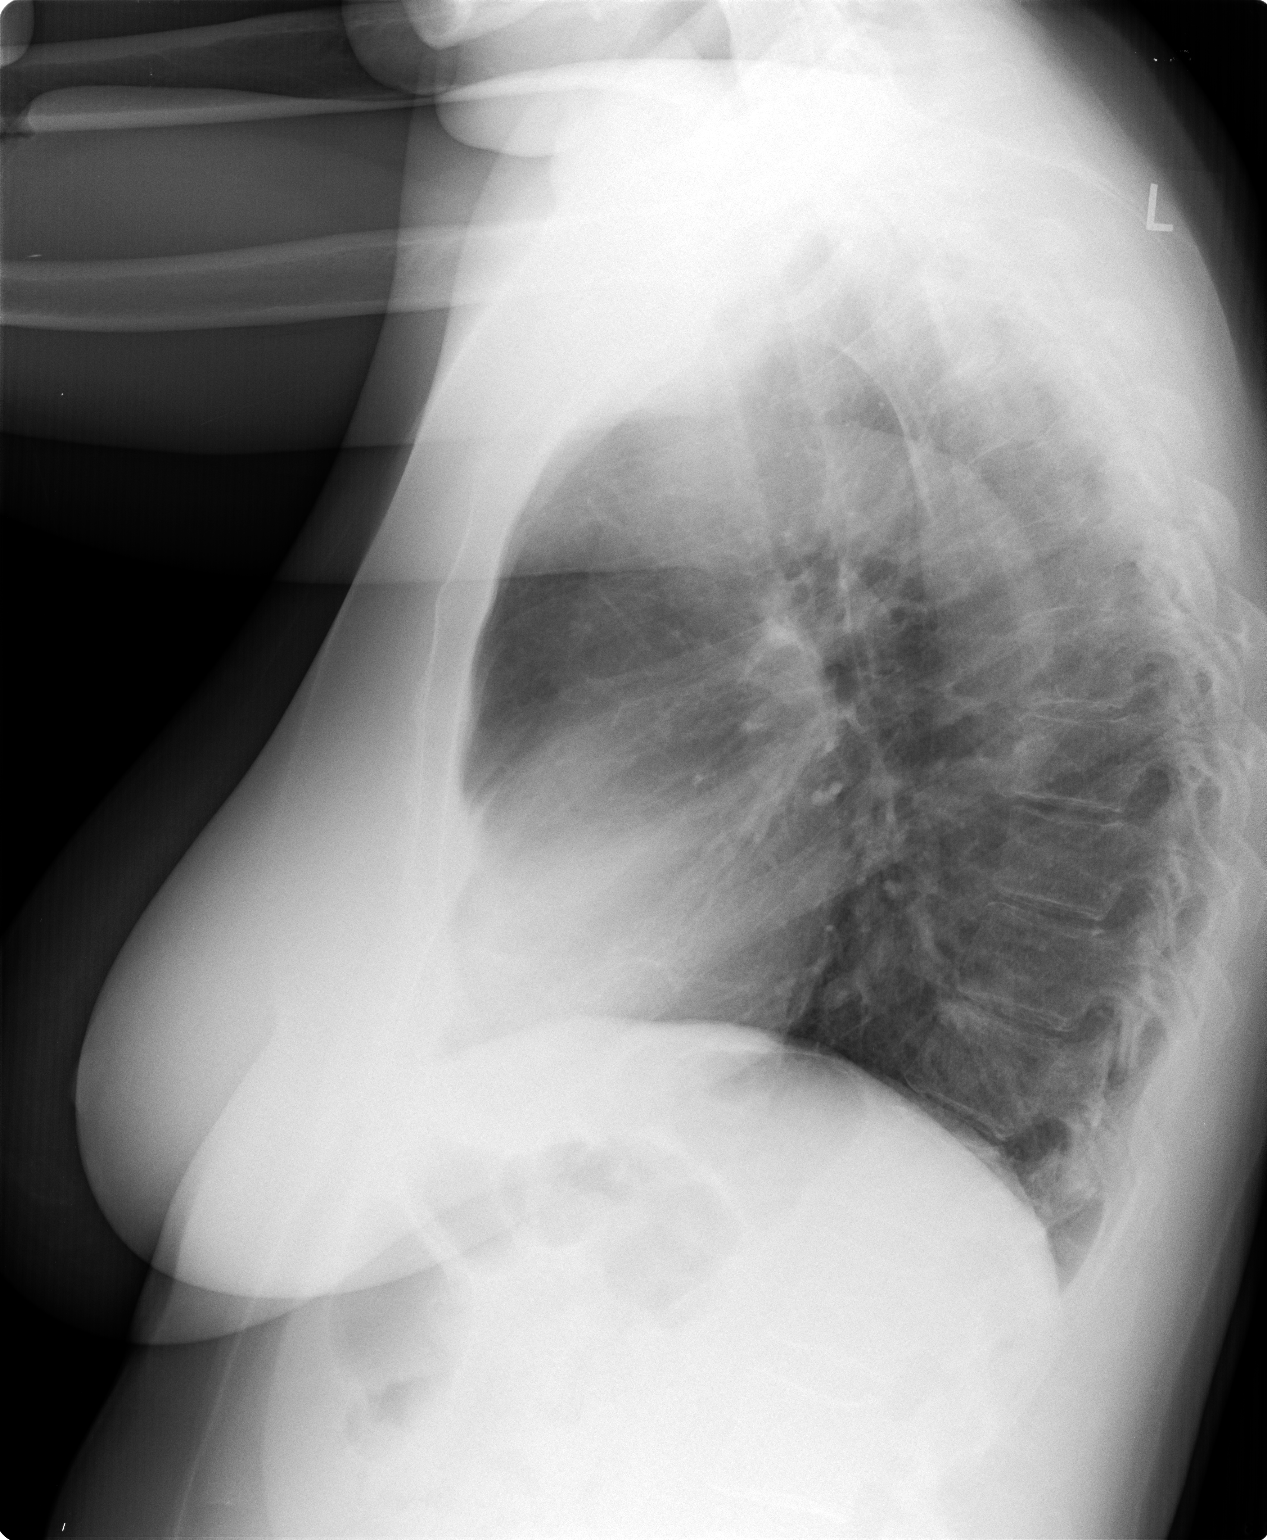

[2 of 2 positions shown; findings below may reference images not displayed]

FINDINGS: Mild linear scarring is noted in the right middle lobe. No active
infiltrate or effusion is seen. Mediastinal and hilar contours are
unremarkable. The heart is within normal limits in size. No bony
abnormality is seen.
IMPRESSION: No active cardiopulmonary disease.

## 2017-11-22 ENCOUNTER — Ambulatory Visit: Payer: BLUE CROSS/BLUE SHIELD | Admitting: Obstetrics and Gynecology

## 2017-11-26 ENCOUNTER — Encounter: Payer: Self-pay | Admitting: Obstetrics and Gynecology

## 2017-11-26 ENCOUNTER — Ambulatory Visit: Payer: BLUE CROSS/BLUE SHIELD | Admitting: Obstetrics and Gynecology

## 2017-11-26 ENCOUNTER — Other Ambulatory Visit: Payer: Self-pay

## 2017-11-26 VITALS — BP 136/70 | HR 56 | Resp 16 | Ht 65.5 in | Wt 203.0 lb

## 2017-11-26 DIAGNOSIS — N762 Acute vulvitis: Secondary | ICD-10-CM | POA: Diagnosis not present

## 2017-11-26 DIAGNOSIS — Z1211 Encounter for screening for malignant neoplasm of colon: Secondary | ICD-10-CM

## 2017-11-26 DIAGNOSIS — Z0000 Encounter for general adult medical examination without abnormal findings: Secondary | ICD-10-CM

## 2017-11-26 DIAGNOSIS — Z01419 Encounter for gynecological examination (general) (routine) without abnormal findings: Secondary | ICD-10-CM

## 2017-11-26 DIAGNOSIS — E559 Vitamin D deficiency, unspecified: Secondary | ICD-10-CM | POA: Diagnosis not present

## 2017-11-26 DIAGNOSIS — R7303 Prediabetes: Secondary | ICD-10-CM | POA: Diagnosis not present

## 2017-11-26 DIAGNOSIS — N3941 Urge incontinence: Secondary | ICD-10-CM

## 2017-11-26 MED ORDER — BETAMETHASONE VALERATE 0.1 % EX OINT: TOPICAL_OINTMENT | CUTANEOUS | 0 refills | Status: DC

## 2017-11-26 NOTE — Patient Instructions (Addendum)
EXERCISE AND DIET:  We recommended that you start or continue a regular exercise program for good health. Regular exercise means any activity that makes your heart beat faster and makes you sweat.  We recommend exercising at least 30 minutes per day at least 3 days a week, preferably 4 or 5.  We also recommend a diet low in fat and sugar.  Inactivity, poor dietary choices and obesity can cause diabetes, heart attack, stroke, and kidney damage, among others.    ALCOHOL AND SMOKING:  Women should limit their alcohol intake to no more than 7 drinks/beers/glasses of wine (combined, not each!) per week. Moderation of alcohol intake to this level decreases your risk of breast cancer and liver damage. And of course, no recreational drugs are part of a healthy lifestyle.  And absolutely no smoking or even second hand smoke. Most people know smoking can cause heart and lung diseases, but did you know it also contributes to weakening of your bones? Aging of your skin?  Yellowing of your teeth and nails?  CALCIUM AND VITAMIN D:  Adequate intake of calcium and Vitamin D are recommended.  The recommendations for exact amounts of these supplements seem to change often, but generally speaking 600 mg of calcium (either carbonate or citrate) and 800 units of Vitamin D per day seems prudent. Certain women may benefit from higher intake of Vitamin D.  If you are among these women, your doctor will have told you during your visit.    PAP SMEARS:  Pap smears, to check for cervical cancer or precancers,  have traditionally been done yearly, although recent scientific advances have shown that most women can have pap smears less often.  However, every woman still should have a physical exam from her gynecologist every year. It will include a breast check, inspection of the vulva and vagina to check for abnormal growths or skin changes, a visual exam of the cervix, and then an exam to evaluate the size and shape of the uterus and  ovaries.  And after 61 years of age, a rectal exam is indicated to check for rectal cancers. We will also provide age appropriate advice regarding health maintenance, like when you should have certain vaccines, screening for sexually transmitted diseases, bone density testing, colonoscopy, mammograms, etc.   MAMMOGRAMS:  All women over 40 years old should have a yearly mammogram. Many facilities now offer a "3D" mammogram, which may cost around $50 extra out of pocket. If possible,  we recommend you accept the option to have the 3D mammogram performed.  It both reduces the number of women who will be called back for extra views which then turn out to be normal, and it is better than the routine mammogram at detecting truly abnormal areas.    COLONOSCOPY:  Colonoscopy to screen for colon cancer is recommended for all women at age 50.  We know, you hate the idea of the prep.  We agree, BUT, having colon cancer and not knowing it is worse!!  Colon cancer so often starts as a polyp that can be seen and removed at colonscopy, which can quite literally save your life!  And if your first colonoscopy is normal and you have no family history of colon cancer, most women don't have to have it again for 10 years.  Once every ten years, you can do something that may end up saving your life, right?  We will be happy to help you get it scheduled when you are ready.    Be sure to check your insurance coverage so you understand how much it will cost.  It may be covered as a preventative service at no cost, but you should check your particular policy.       Kegel Exercises Kegel exercises help strengthen the muscles that support the rectum, vagina, small intestine, bladder, and uterus. Doing Kegel exercises can help:  Improve bladder and bowel control.  Improve sexual response.  Reduce problems and discomfort during pregnancy.  Kegel exercises involve squeezing your pelvic floor muscles, which are the same muscles you  squeeze when you try to stop the flow of urine. The exercises can be done while sitting, standing, or lying down, but it is best to vary your position. Phase 1 exercises 1. Squeeze your pelvic floor muscles tight. You should feel a tight lift in your rectal area. If you are a female, you should also feel a tightness in your vaginal area. Keep your stomach, buttocks, and legs relaxed. 2. Hold the muscles tight for up to 10 seconds. 3. Relax your muscles. Repeat this exercise 50 times a day or as many times as told by your health care provider. Continue to do this exercise for at least 4-6 weeks or for as long as told by your health care provider. This information is not intended to replace advice given to you by your health care provider. Make sure you discuss any questions you have with your health care provider. Document Released: 05/29/2012 Document Revised: 02/05/2016 Document Reviewed: 05/02/2015 Elsevier Interactive Patient Education  [employer].    I would recommend a mediterranean diet and regular exercise.  A mediterranean diet is high in fruits, vegetables, whole grains, fish, chicken, nuts, healthy fats (olive oil or canola oil). Low fat dairy. Limit butter, margarine, red meat and sweets.

## 2017-11-26 NOTE — Progress Notes (Signed)
62 y.o. [place] here for annual exam.  H/O TAH/BSO/sacrocolpopexy/anterior repair/TVT.  She has a h/o pre-diabetes, c/o excessive thirst.  No leaking urine with valsalva. She does have some urgency to void and can leak a little bit on the way to the bathroom. Leaks a couple of times a week (wears a mini-pad). Not sexually active. No vaginal bleeding. She has had some vulvar itching in the last week, no d/c. Used a new soap.     Patient's last menstrual period was 06/27/2011 (approximate).          Sexually active: No.  The current method of family planning is status post hysterectomy.    Exercising: No.  The patient does not participate in regular exercise at present. Smoker:  no  Health Maintenance: Pap:  08-30-15 [place] 10-28-14 WNL  History of abnormal Pap: yes years ago- repeat PAP normal  MMG:  11-09-15 WNL  Colonoscopy:  Never BMD:   2017 normal per patient   TDaP:  10-13-09 Gardasil: N/A   reports that she has never smoked. She has never used smokeless tobacco. She reports that she does not drink alcohol or use drugs. Son and 3 grandchildren (4-13) are local. 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 Diabetes mellitus without complication (HCC)    diet controlled  . Fibroid   . Herpes simplex virus (HSV) infection   . Hyperlipidemia   . Prediabetes   . Urinary frequency 10/28/2014  . Urinary incontinence 10/28/2014  . Vaginal itching 10/28/2014  . Vertigo     Past Surgical History:  Procedure Laterality Date  . ABDOMINAL HYSTERECTOMY N/A 11/16/2015   Procedure: TOTAL ABDOMINAL HYSTERECTOMY;  Surgeon: Salvadore Dom, MD;  Location: [facility];  Service: Gynecology;  Laterality: N/A;  . ABDOMINAL SACROCOLPOPEXY N/A 11/16/2015   Procedure: ABDOMINO SACROCOLPOPEXY HALBAN'S CULDOPLASTY ;  Surgeon: Nunzio Cobbs, MD;  Location: [facility];  Service: Gynecology;   Laterality: N/A;  . ANTERIOR AND POSTERIOR REPAIR N/A 11/16/2015   Procedure: ANTERIOR (CYSTOCELE) AND PERINEORRHAPHY;  Surgeon: Nunzio Cobbs, MD;  Location: [facility];  Service: Gynecology;  Laterality: N/A;  . BLADDER SUSPENSION N/A 11/16/2015   Procedure: TRANSVAGINAL TAPE (TVT) PROCEDURE MED URETHRAL SLING ;  Surgeon: Nunzio Cobbs, MD;  Location: [facility];  Service: Gynecology;  Laterality: N/A;  . BREAST BIOPSY    . CYSTOSCOPY N/A 11/16/2015   Procedure: CYSTOSCOPY;  Surgeon: Nunzio Cobbs, MD;  Location: [facility];  Service: Gynecology;  Laterality: N/A;  . SALPINGOOPHORECTOMY Bilateral 11/16/2015   Procedure: BILATERAL SALPINGO OOPHORECTOMY;  Surgeon: Salvadore Dom, MD;  Location: [facility];  Service: Gynecology;  Laterality: Bilateral;  . TUBAL LIGATION    . WISDOM TOOTH EXTRACTION      Current Outpatient Medications  Medication Sig Dispense Refill  . meclizine (ANTIVERT) 25 MG tablet Take 1 tablet (25 mg total) by mouth 3 (three) times daily as needed for dizziness. 30 tablet 1   No current facility-administered medications for this visit.     Family History  Problem Relation Age of Onset  . Heart failure Mother   . Diabetes Mother   . Aneurysm Father   . Diabetes Maternal Grandmother   . Diabetes Other        mom's siblings, maternal cousins    Review of Systems  Constitutional: Negative.  HENT: Negative.   Eyes: Negative.   Respiratory: Negative.   Cardiovascular: Negative.   Gastrointestinal: Negative.   Endocrine: Positive for polydipsia.  Genitourinary:       Vaginal itching   Musculoskeletal: Negative.   Skin: Negative.   Allergic/Immunologic: Negative.   Neurological: Negative.   Psychiatric/Behavioral: Negative.     Exam:   BP 136/70 (BP Location: Right Arm, Patient Position: Sitting, Cuff Size: Normal)   Pulse (!) 56   Resp 16   Ht 5' 5.5" (1.664 m)   Wt 203 lb (92.1 kg)   LMP 06/27/2011 (Approximate)   BMI 33.27  kg/m   Weight change: @WEIGHTCHANGE @ Height:   Height: 5' 5.5" (166.4 cm)  Ht Readings from Last 3 Encounters:  11/26/17 5' 5.5" (1.664 m)  11/21/16 5' 4.5" (1.638 m)  11/09/16 5' 5.5" (1.664 m)    General appearance: alert, cooperative and appears stated age Head: Normocephalic, without obvious abnormality, atraumatic Neck: no adenopathy, supple, symmetrical, trachea midline and thyroid normal to inspection and palpation Lungs: clear to auscultation bilaterally Cardiovascular: regular rate and rhythm Breasts: normal appearance, no masses or tenderness Abdomen: soft, non-tender; non distended,  no masses,  no organomegaly Extremities: extremities normal, atraumatic, no cyanosis or edema Skin: Skin color, texture, turgor normal. No rashes or lesions Lymph nodes: Cervical, supraclavicular, and axillary nodes normal. No abnormal inguinal nodes palpated Neurologic: Grossly normal   Pelvic: External genitalia:  no lesions              Urethra:  normal appearing urethra with no masses, tenderness or lesions              Bartholins and Skenes: normal                 Vagina: atrophic appearing vagina with normal color and discharge, no lesions. Grade 1-2 cystocele (not symptomatic)              Cervix: absent               Bimanual Exam:  Uterus:  uterus absent              Adnexa: no mass, fullness, tenderness               Rectovaginal: Confirms               Anus:  normal sphincter tone, no lesions  Chaperone was present for exam.  A:  Well Woman with normal exam  BMI 33  Vulvitis   Polydipsia  Urge incontinence  Vit d def  Cystocele, not symptomatic   P:   No pap needed  Mammogram  Colonoscopy  Check urine for ua, c&s  Discussed options of Kegel's, PT and medication  Discussed breast self exam  Discussed calcium and vit D intake  Recommended exercise and a mediterranean diet  Screening labs, TSH, HgbA1C, vit d

## 2017-11-26 NOTE — Addendum Note (Signed)
Addended by: Susanne Greenhouse E on: 11/26/2017 04:21 PM   Modules accepted: Orders

## 2017-11-27 ENCOUNTER — Encounter: Payer: Self-pay | Admitting: Internal Medicine

## 2017-11-27 LAB — LIPID PANEL
CHOL/HDL RATIO: 5.5 ratio — AB (ref 0.0–4.4)
Cholesterol, Total: 226 mg/dL — ABNORMAL HIGH (ref 100–199)
HDL: 41 mg/dL (ref >39)
LDL CALC: 153 mg/dL — AB (ref 0–99)
Triglycerides: 160 mg/dL — ABNORMAL HIGH (ref 0–149)
VLDL Cholesterol Cal: 32 mg/dL (ref 5–40)

## 2017-11-27 LAB — URINALYSIS, MICROSCOPIC ONLY
Bacteria, UA: NONE SEEN
CASTS: NONE SEEN /LPF
Epithelial Cells (non renal): NONE SEEN /hpf (ref 0–10)

## 2017-11-27 LAB — HEMOGLOBIN A1C
Est. average glucose Bld gHb Est-mCnc: 157 mg/dL
HEMOGLOBIN A1C: 7.1 % — AB (ref 4.8–5.6)

## 2017-11-27 LAB — CBC
Hematocrit: 46.8 % — ABNORMAL HIGH (ref 34.0–46.6)
Hemoglobin: 15.3 g/dL (ref 11.1–15.9)
MCH: 27.1 pg (ref 26.6–33.0)
MCHC: 32.7 g/dL (ref 31.5–35.7)
MCV: 83 fL (ref 79–97)
PLATELETS: 199 10*3/uL (ref 150–450)
RBC: 5.65 x10E6/uL — ABNORMAL HIGH (ref 3.77–5.28)
RDW: 15.3 % (ref 12.3–15.4)
WBC: 5.7 10*3/uL (ref 3.4–10.8)

## 2017-11-27 LAB — COMPREHENSIVE METABOLIC PANEL
ALK PHOS: 89 IU/L (ref 39–117)
ALT: 11 IU/L (ref 0–32)
AST: 11 IU/L (ref 0–40)
Albumin/Globulin Ratio: 1.6 (ref 1.2–2.2)
Albumin: 4.2 g/dL (ref 3.6–4.8)
BILIRUBIN TOTAL: 0.4 mg/dL (ref 0.0–1.2)
BUN / CREAT RATIO: 12 (ref 12–28)
BUN: 14 mg/dL (ref 8–27)
CHLORIDE: 107 mmol/L — AB (ref 96–106)
CO2: 23 mmol/L (ref 20–29)
Calcium: 9.3 mg/dL (ref 8.7–10.3)
Creatinine, Ser: 1.15 mg/dL — ABNORMAL HIGH (ref 0.57–1.00)
GFR calc Af Amer: 60 mL/min/{1.73_m2} (ref >59)
GFR calc non Af Amer: 52 mL/min/{1.73_m2} — ABNORMAL LOW (ref >59)
GLUCOSE: 129 mg/dL — AB (ref 65–99)
Globulin, Total: 2.7 g/dL (ref 1.5–4.5)
POTASSIUM: 4.1 mmol/L (ref 3.5–5.2)
Sodium: 144 mmol/L (ref 134–144)
Total Protein: 6.9 g/dL (ref 6.0–8.5)

## 2017-11-27 LAB — VAGINITIS/VAGINOSIS, DNA PROBE
CANDIDA SPECIES: NEGATIVE
GARDNERELLA VAGINALIS: NEGATIVE
TRICHOMONAS VAG: NEGATIVE

## 2017-11-27 LAB — URINE CULTURE: Organism ID, Bacteria: NO GROWTH

## 2017-11-27 LAB — VITAMIN D 25 HYDROXY (VIT D DEFICIENCY, FRACTURES): Vit D, 25-Hydroxy: 12 ng/mL — ABNORMAL LOW (ref 30.0–100.0)

## 2017-11-27 LAB — TSH: TSH: 0.833 u[IU]/mL (ref 0.450–4.500)

## 2017-12-04 ENCOUNTER — Telehealth: Payer: Self-pay | Admitting: *Deleted

## 2017-12-04 MED ORDER — VITAMIN D (ERGOCALCIFEROL) 1.25 MG (50000 UNIT) PO CAPS: 50000.0000 [IU] | ORAL_CAPSULE | ORAL | 0 refills | Status: DC

## 2017-12-04 NOTE — Telephone Encounter (Signed)
Spoke with patient. Results given. Patient will follow up with PCP. Declines assistance with scheduling appointment. Labs faxed to [facility]. Rx for Vitamin D 50,000 IU weekly #[id number] sent to pharmacy on file. 3 month lab recheck scheduled for 03/08/2018 at 10 am. Patient is agreeable to date and time. Encounter closed.

## 2017-12-04 NOTE — Telephone Encounter (Signed)
-----   Message from Salvadore Dom, MD sent at 11/28/2017 10:59 AM EDT ----- Please inform the patient that she has a vit d def and start her on 50,000 IU of vit d3 weekly x 12 weeks. She then needs another vit d level Please let her know that her blood work is c/w diabetes, her lipid panel is abnormal. Her renal function is abnormal, but slightly improved from the last 2 years. The rest of her lab work was normal, including the vaginitis panel and urine culture.  She needs to f/u with her primary. Please send a copy of her labs to her primary.

## 2017-12-04 NOTE — Telephone Encounter (Signed)
Attempted to reach patient to review results. Patient does not have voicemail set up, so unable to leave message to return call. Please route to Triage if I am unavailable.

## 2017-12-06 ENCOUNTER — Telehealth: Payer: Self-pay | Admitting: *Deleted

## 2017-12-06 NOTE — Telephone Encounter (Signed)
Patient aware of lab results and appt made for annual exam

## 2017-12-17 ENCOUNTER — Ambulatory Visit (INDEPENDENT_AMBULATORY_CARE_PROVIDER_SITE_OTHER): Payer: BLUE CROSS/BLUE SHIELD | Admitting: Pediatrics

## 2017-12-17 ENCOUNTER — Encounter: Payer: Self-pay | Admitting: Pediatrics

## 2017-12-17 ENCOUNTER — Ambulatory Visit (INDEPENDENT_AMBULATORY_CARE_PROVIDER_SITE_OTHER): Payer: BLUE CROSS/BLUE SHIELD

## 2017-12-17 VITALS — BP 122/77 | HR 67 | Temp 97.5°F | Ht 65.5 in | Wt 201.2 lb

## 2017-12-17 DIAGNOSIS — R079 Chest pain, unspecified: Secondary | ICD-10-CM

## 2017-12-17 DIAGNOSIS — E785 Hyperlipidemia, unspecified: Secondary | ICD-10-CM

## 2017-12-17 DIAGNOSIS — M25561 Pain in right knee: Secondary | ICD-10-CM | POA: Diagnosis not present

## 2017-12-17 DIAGNOSIS — Z0000 Encounter for general adult medical examination without abnormal findings: Secondary | ICD-10-CM

## 2017-12-17 DIAGNOSIS — E1169 Type 2 diabetes mellitus with other specified complication: Secondary | ICD-10-CM

## 2017-12-17 DIAGNOSIS — M1711 Unilateral primary osteoarthritis, right knee: Secondary | ICD-10-CM | POA: Diagnosis not present

## 2017-12-17 DIAGNOSIS — E119 Type 2 diabetes mellitus without complications: Secondary | ICD-10-CM | POA: Diagnosis not present

## 2017-12-17 MED ORDER — PRAVASTATIN SODIUM 20 MG PO TABS: 20.0000 mg | ORAL_TABLET | Freq: Every day | ORAL | 3 refills | Status: DC

## 2017-12-17 MED ORDER — METFORMIN HCL 500 MG PO TABS: 500.0000 mg | ORAL_TABLET | Freq: Two times a day (BID) | ORAL | 3 refills | Status: DC

## 2017-12-17 NOTE — Patient Instructions (Signed)
Schedule eye exam  Schedule mammogram [place] Appointment: [phone]

## 2017-12-17 NOTE — Progress Notes (Signed)
Subjective:   Patient ID: Heather Leach, female    DOB: 07-29-1955, 62 y.o.   MRN: [id number] CC: Annual Exam  HPI: Heather Leach is a 62 y.o. female   Chest pain: episodes of chest pain, sometimes out of the blue, sometimes with exertion.  Often in the morning when she wakes up but can also come on with walking.  Sometimes lasts 5 to 10 minutes.  A week ago she had episodes started in the morning when she woke up, had to sit down several times while getting ready for church.  Lasted for several hours throughout the day.  Sitting down or resting helps with pain at times.  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  R knee has been bothering her more when she gets up from sitting. Taking tylenol, some improvement in pain. Bothering her most days.  Fam hx: mom with CHF in her 57s, on dialysis  Relevant past medical, surgical, family and social history reviewed. Allergies and medications reviewed and updated. Social History   Tobacco Use  Smoking Status Never Smoker  Smokeless Tobacco Never Used   ROS: All systems negative other than what is in the HPI  Objective:    BP 122/77   Pulse 67   Temp (!) 97.5 F (36.4 C) (Oral)   Ht 5' 5.5" (1.664 m)   Wt 201 lb 3.2 oz (91.3 kg)   LMP 06/27/2011 (Approximate)   BMI 32.97 kg/m   Wt Readings from Last 3 Encounters:  12/17/17 201 lb 3.2 oz (91.3 kg)  11/26/17 203 lb (92.1 kg)  11/21/16 195 lb (88.5 kg)    Gen: NAD, alert, cooperative with exam, NCAT EYES: EOMI, no conjunctival injection, or no icterus ENT:  TMs pearly gray b/l, OP without erythema LYMPH: no cervical LAD CV: NRRR, normal S1/S2, no murmur, distal pulses 2+ b/l Resp: CTABL, no wheezes, normal WOB Abd: +BS, soft, NTND. no guarding or organomegaly Ext: No edema, warm Neuro: Alert and oriented, strength equal b/l UE and LE, coordination grossly normal MSK: nl ROM b/l knees,  crepitus present R knee>L knee. Some tenderness with patella rocking.  Assessment & Plan:  Heather Leach was seen today for annual exam.  Diagnoses and all orders for this visit:  Encounter for preventive health examination Continue regular physical activity, avoiding sugary/carb heavy foods.  Diabetes mellitus without complication (HCC) New diagnosis, A1c 7.1.  Discussed pathophysiology, nutrition, follow-up and monitoring.  Start metformin.  If loose stools, she should take half a pill rather than a full pill for 2 weeks. -     metFORMIN (GLUCOPHAGE) 500 MG tablet; Take 1 tablet (500 mg total) by mouth 2 (two) times daily with a meal. -     Microalbumin / creatinine urine ratio  Hyperlipidemia associated with type 2 diabetes mellitus (HCC) -     pravastatin (PRAVACHOL) 20 MG tablet; Take 1 tablet (20 mg total) by mouth daily.  Acute pain of right knee Likely due to arthritis. Rest, ice. OK to use aspercreme, tylenol.  -     DG Knee 1-2 Views Right; Future  Chest pain, unspecified type H/o abnormal EKG stress test years ago, though I cannot see the actual results. Some atypical features. Risk factors include DM2, HLD, family history. -     Ambulatory referral to Cardiology   Follow up plan: Return in about 3 months (around 03/19/2018). Assunta Found, MD [place]

## 2017-12-18 LAB — MICROALBUMIN / CREATININE URINE RATIO: Creatinine, Urine: 109 mg/dL

## 2018-01-08 ENCOUNTER — Ambulatory Visit: Payer: BLUE CROSS/BLUE SHIELD | Admitting: Cardiovascular Disease

## 2018-01-24 ENCOUNTER — Ambulatory Visit (AMBULATORY_SURGERY_CENTER): Payer: Self-pay | Admitting: *Deleted

## 2018-01-24 VITALS — Ht 65.0 in | Wt 198.8 lb

## 2018-01-24 DIAGNOSIS — Z1211 Encounter for screening for malignant neoplasm of colon: Secondary | ICD-10-CM

## 2018-01-24 NOTE — Progress Notes (Signed)
No egg or soy allergy known to patient  No issues with past sedation with any surgeries  or procedures, no intubation problems  No diet pills per patient No home 02 use per patient  No blood thinners per patient  Pt denies issues with constipation  No A fib or A flutter  EMMI video offered, patient does not have email.

## 2018-02-06 ENCOUNTER — Encounter: Payer: Self-pay | Admitting: Internal Medicine

## 2018-02-07 ENCOUNTER — Encounter: Payer: BLUE CROSS/BLUE SHIELD | Admitting: Internal Medicine

## 2018-02-11 ENCOUNTER — Encounter: Payer: Self-pay | Admitting: Internal Medicine

## 2018-02-11 ENCOUNTER — Ambulatory Visit (AMBULATORY_SURGERY_CENTER): Payer: BLUE CROSS/BLUE SHIELD | Admitting: Internal Medicine

## 2018-02-11 VITALS — BP 107/54 | HR 55 | Temp 97.8°F | Resp 17 | Ht 65.0 in | Wt 198.0 lb

## 2018-02-11 DIAGNOSIS — D120 Benign neoplasm of cecum: Secondary | ICD-10-CM

## 2018-02-11 DIAGNOSIS — D123 Benign neoplasm of transverse colon: Secondary | ICD-10-CM | POA: Diagnosis not present

## 2018-02-11 DIAGNOSIS — D125 Benign neoplasm of sigmoid colon: Secondary | ICD-10-CM

## 2018-02-11 DIAGNOSIS — Z1211 Encounter for screening for malignant neoplasm of colon: Secondary | ICD-10-CM | POA: Diagnosis not present

## 2018-02-11 MED ORDER — SODIUM CHLORIDE 0.9 % IV SOLN: 500.0000 mL | Freq: Once | INTRAVENOUS | Status: DC

## 2018-02-11 NOTE — Progress Notes (Signed)
Called to room to assist during endoscopic procedure.  Patient ID and intended procedure confirmed with present staff. Received instructions for my participation in the procedure from the performing physician.  

## 2018-02-11 NOTE — Progress Notes (Signed)
A/ox3 pleased with MAC, report to RN 

## 2018-02-11 NOTE — Op Note (Signed)
[facility] Patient Name: Heather Leach Procedure Date: 02/11/2018 10:34 AM MRN: [id number] Endoscopist: Gatha Mayer , MD Age: 62 Referring MD:  Date of Birth: 05-Aug-1955 Gender: Female Account #: [id number] Procedure:                Colonoscopy Indications:              Screening for colorectal malignant neoplasm, This                            is the patient's first colonoscopy Medicines:                Propofol per Anesthesia, Monitored Anesthesia Care Procedure:                Pre-Anesthesia Assessment:                           - Prior to the procedure, a History and Physical                            was performed, and patient medications and                            allergies were reviewed. The patient's tolerance of                            previous anesthesia was also reviewed. The risks                            and benefits of the procedure and the sedation                            options and risks were discussed with the patient.                            All questions were answered, and informed consent                            was obtained. Prior Anticoagulants: The patient has                            taken no previous anticoagulant or antiplatelet                            agents. ASA Grade Assessment: II - A patient with                            mild systemic disease. After reviewing the risks                            and benefits, the patient was deemed in                            satisfactory condition to undergo the procedure.  After obtaining informed consent, the colonoscope                            was passed under direct vision. Throughout the                            procedure, the patient's blood pressure, pulse, and                            oxygen saturations were monitored continuously. The                            Model CF-HQ190L [id number]) scope was introduced   through the anus and advanced to the the cecum,                            identified by appendiceal orifice and ileocecal                            valve. The colonoscopy was performed without                            difficulty. The patient tolerated the procedure                            well. The quality of the bowel preparation was                            good. The ileocecal valve, appendiceal orifice, and                            rectum were photographed. The bowel preparation                            used was Miralax. Scope In: 10:45:33 AM Scope Out: 11:09:01 AM Scope Withdrawal Time: 0 hours 20 minutes 16 seconds  Total Procedure Duration: 0 hours 23 minutes 28 seconds  Findings:                 The perianal and digital rectal examinations were                            normal.                           Four sessile polyps were found in the sigmoid                            colon, transverse colon and cecum. The polyps were                            diminutive in size. These polyps were removed with                            a cold snare. Resection and retrieval were  complete. Verification of patient identification                            for the specimen was done. Estimated blood loss was                            minimal.                           The exam was otherwise without abnormality on                            direct and retroflexion views. Complications:            No immediate complications. Estimated Blood Loss:     Estimated blood loss was minimal. Impression:               - Four diminutive polyps in the sigmoid colon, in                            the transverse colon and in the cecum, removed with                            a cold snare. Resected and retrieved.                           - The examination was otherwise normal on direct                            and retroflexion views. Recommendation:           -  Patient has a contact number available for                            emergencies. The signs and symptoms of potential                            delayed complications were discussed with the                            patient. Return to normal activities tomorrow.                            Written discharge instructions were provided to the                            patient.                           - Continue present medications.                           - Repeat colonoscopy is recommended. The                            colonoscopy date will be determined after pathology  results from today's exam become available for                            review. Gatha Mayer, MD 02/11/2018 11:14:57 AM This report has been signed electronically.

## 2018-02-11 NOTE — Patient Instructions (Addendum)
   I found and removed 4 tiny polyps - all look benign.  I will let you know pathology results and when to have another routine colonoscopy by mail and/or My Chart.  I appreciate the opportunity to care for you. Gatha Mayer, MD, FACG   YOU HAD AN ENDOSCOPIC PROCEDURE TODAY AT [facility]:   Refer to the procedure report that was given to you for any specific questions about what was found during the examination.  If the procedure report does not answer your questions, please call your gastroenterologist to clarify.  If you requested that your care partner not be given the details of your procedure findings, then the procedure report has been included in a sealed envelope for you to review at your convenience later.  YOU SHOULD EXPECT: Some feelings of bloating in the abdomen. Passage of more gas than usual.  Walking can help get rid of the air that was put into your GI tract during the procedure and reduce the bloating. If you had a lower endoscopy (such as a colonoscopy or flexible sigmoidoscopy) you may notice spotting of blood in your stool or on the toilet paper. If you underwent a bowel prep for your procedure, you may not have a normal bowel movement for a few days.  Please Note:  You might notice some irritation and congestion in your nose or some drainage.  This is from the oxygen used during your procedure.  There is no need for concern and it should clear up in a day or so.  SYMPTOMS TO REPORT IMMEDIATELY:   Following lower endoscopy (colonoscopy or flexible sigmoidoscopy):  Excessive amounts of blood in the stool  Significant tenderness or worsening of abdominal pains  Swelling of the abdomen that is new, acute  Fever of 100F or higher   For urgent or emergent issues, a gastroenterologist can be reached at any hour by calling [phone].   DIET:  We do recommend a small meal at first, but then you may proceed to your regular diet.  Drink plenty of  fluids but you should avoid alcoholic beverages for 24 hours.  ACTIVITY:  You should plan to take it easy for the rest of today and you should NOT DRIVE or use heavy machinery until tomorrow (because of the sedation medicines used during the test).    FOLLOW UP: Our staff will call the number listed on your records the next business day following your procedure to check on you and address any questions or concerns that you may have regarding the information given to you following your procedure. If we do not reach you, we will leave a message.  However, if you are feeling well and you are not experiencing any problems, there is no need to return our call.  We will assume that you have returned to your regular daily activities without incident.  If any biopsies were taken you will be contacted by phone or by letter within the next 1-3 weeks.  Please call us at [phone] if you have not heard about the biopsies in 3 weeks.    SIGNATURES/CONFIDENTIALITY: You and/or your care partner have signed paperwork which will be entered into your electronic medical record.  These signatures attest to the fact that that the information above on your After Visit Summary has been reviewed and is understood.  Full responsibility of the confidentiality of this discharge information lies with you and/or your care-partner.

## 2018-02-11 NOTE — Progress Notes (Signed)
Pt. Reports no change in her medical or surgical history since her pre-visit 01/24/2018.

## 2018-02-12 ENCOUNTER — Telehealth: Payer: Self-pay

## 2018-02-12 NOTE — Telephone Encounter (Signed)
  Follow up Call-  Call back number 02/11/2018  Post procedure Call Back phone  # [phone]  Permission to leave phone message Yes  Some recent data might be hidden     Patient questions:  Do you have a fever, pain , or abdominal swelling? No. Pain Score  0 *  Have you tolerated food without any problems? Yes.    Have you been able to return to your normal activities? Yes.    Do you have any questions about your discharge instructions: Diet   No. Medications  No. Follow up visit  No.  Do you have questions or concerns about your Care? No.  Actions: * If pain score is 4 or above: No action needed, pain <4.

## 2018-02-17 ENCOUNTER — Encounter: Payer: Self-pay | Admitting: Internal Medicine

## 2018-02-17 DIAGNOSIS — Z860100 Personal history of colon polyps, unspecified: Secondary | ICD-10-CM

## 2018-02-17 HISTORY — DX: Personal history of colonic polyps: Z86.010

## 2018-02-17 HISTORY — DX: Personal history of colon polyps, unspecified: Z86.0100

## 2018-02-17 NOTE — Progress Notes (Signed)
Adenoma and ssps Recall 2024 My chart

## 2018-03-06 ENCOUNTER — Telehealth: Payer: Self-pay | Admitting: Obstetrics and Gynecology

## 2018-03-06 NOTE — Telephone Encounter (Signed)
Patient canceled her vitamin d lab appointment 03/08/18. Patient states she will need to call later to reschedule.

## 2018-03-08 ENCOUNTER — Other Ambulatory Visit: Payer: BLUE CROSS/BLUE SHIELD

## 2018-03-27 NOTE — Telephone Encounter (Signed)
Left another message for patient to call and reschedule her vitamin d appointment. No response from patient, okay to close encounter?

## 2018-04-18 DIAGNOSIS — E11319 Type 2 diabetes mellitus with unspecified diabetic retinopathy without macular edema: Secondary | ICD-10-CM | POA: Diagnosis not present

## 2018-04-18 LAB — HM DIABETES EYE EXAM

## 2018-07-27 DIAGNOSIS — Z6833 Body mass index (BMI) 33.0-33.9, adult: Secondary | ICD-10-CM | POA: Diagnosis not present

## 2018-07-27 DIAGNOSIS — R509 Fever, unspecified: Secondary | ICD-10-CM | POA: Diagnosis not present

## 2018-07-27 DIAGNOSIS — J40 Bronchitis, not specified as acute or chronic: Secondary | ICD-10-CM | POA: Diagnosis not present

## 2018-10-21 ENCOUNTER — Ambulatory Visit (INDEPENDENT_AMBULATORY_CARE_PROVIDER_SITE_OTHER): Payer: BLUE CROSS/BLUE SHIELD | Admitting: Family Medicine

## 2018-10-21 ENCOUNTER — Encounter: Payer: Self-pay | Admitting: Family Medicine

## 2018-10-21 ENCOUNTER — Other Ambulatory Visit: Payer: Self-pay

## 2018-10-21 DIAGNOSIS — R42 Dizziness and giddiness: Secondary | ICD-10-CM

## 2018-10-21 MED ORDER — MECLIZINE HCL 25 MG PO TABS: 25.0000 mg | ORAL_TABLET | Freq: Three times a day (TID) | ORAL | 1 refills | Status: DC | PRN

## 2018-10-21 NOTE — Progress Notes (Signed)
Virtual Visit via telephone Note Due to COVID-19, visit is conducted virtually and was requested by patient. This visit type was conducted due to national recommendations for restrictions regarding the COVID-19 Pandemic (e.g. social distancing) in an effort to limit this patient's exposure and mitigate transmission in our community.  Due to her co-morbid illnesses, this patient is at least at moderate risk for complications without adequate follow up.  This format is felt to be most appropriate for this patient at this time.  All issues noted in this document were discussed and addressed.  A physical exam was not performed with this format.   I connected with Heather Leach on 10/21/18 at 1115 by telephone and verified that I am speaking with the correct person using two identifiers. Heather Leach is currently located at home and family is currently with them during visit. The provider, Monia Pouch, FNP is located in their office at time of visit.  I discussed the limitations, risks, security and privacy concerns of performing an evaluation and management service by telephone and the availability of in person appointments. I also discussed with the patient that there may be a patient responsible charge related to this service. The patient expressed understanding and agreed to proceed.  Subjective:  Patient ID: Heather Leach, female    DOB: 10-14-1955, 63 y.o.   MRN: [id number]  Chief Complaint:  Dizziness   HPI: Heather Leach is a 63 y.o. female presenting on 10/21/2018 for Dizziness   Pt reports dizziness and feeling off balance since Saturday. Pt states this is worse at times. States she has vertigo and this is consistent with her previous vertigo episodes. She denies nausea, vomiting, weakness, confusion, focal deficits, slurred speech, facial droop, or visual disturbances. States her prescription for Meclizine has expired and she would like to refill this today.   Relevant past medical, surgical,  family, and social history reviewed and updated as indicated.  Allergies and medications reviewed and updated.   Past Medical History:  Diagnosis Date  . 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 Diabetes mellitus without complication (HCC)    diet controlled  . Fibroid   . Herpes simplex virus (HSV) infection   . Hx of colonic polyps 02/17/2018  . Hyperlipidemia   . Prediabetes   . Urinary frequency 10/28/2014  . Urinary incontinence 10/28/2014  . Vaginal itching 10/28/2014  . Vertigo     Past Surgical History:  Procedure Laterality Date  . ABDOMINAL HYSTERECTOMY N/A 11/16/2015   Procedure: TOTAL ABDOMINAL HYSTERECTOMY;  Surgeon: Salvadore Dom, MD;  Location: [facility];  Service: Gynecology;  Laterality: N/A;  . ABDOMINAL SACROCOLPOPEXY N/A 11/16/2015   Procedure: ABDOMINO SACROCOLPOPEXY HALBAN'S CULDOPLASTY ;  Surgeon: Nunzio Cobbs, MD;  Location: [facility];  Service: Gynecology;  Laterality: N/A;  . ANTERIOR AND POSTERIOR REPAIR N/A 11/16/2015   Procedure: ANTERIOR (CYSTOCELE) AND PERINEORRHAPHY;  Surgeon: Nunzio Cobbs, MD;  Location: [facility];  Service: Gynecology;  Laterality: N/A;  . BLADDER SUSPENSION N/A 11/16/2015   Procedure: TRANSVAGINAL TAPE (TVT) PROCEDURE MED URETHRAL SLING ;  Surgeon: Nunzio Cobbs, MD;  Location: [facility];  Service: Gynecology;  Laterality: N/A;  . BREAST BIOPSY    . CYSTOSCOPY N/A 11/16/2015   Procedure: CYSTOSCOPY;  Surgeon: Nunzio Cobbs, MD;  Location: [facility];  Service: Gynecology;  Laterality: N/A;  . SALPINGOOPHORECTOMY Bilateral 11/16/2015   Procedure: BILATERAL SALPINGO OOPHORECTOMY;  Surgeon: Salvadore Dom, MD;  Location: [facility];  Service: Gynecology;  Laterality: Bilateral;  . TUBAL LIGATION    . WISDOM TOOTH EXTRACTION      Social History   Socioeconomic History  . Marital status: Single    Spouse name: Not on file  . Number of children: Not on file  . Years of education: Not on file  . Highest  education level: Not on file  Occupational History  . Occupation: Works at [employer]: [place]  . Financial resource strain: Not on file  . Food insecurity:    Worry: Not on file    Inability: Not on file  . Transportation needs:    Medical: Not on file    Non-medical: Not on file  Tobacco Use  . Smoking status: Never Smoker  . Smokeless tobacco: Never Used  Substance and Sexual Activity  . Alcohol use: No    Alcohol/week: 0.0 standard drinks  . Drug use: No  . Sexual activity: Not Currently    Partners: Male    Birth control/protection: Post-menopausal, Surgical    Comment: tubal/hysterectomy  Lifestyle  . Physical activity:    Days per week: Not on file    Minutes per session: Not on file  . Stress: Not on file  Relationships  . Social connections:    Talks on phone: Not on file    Gets together: Not on file    Attends religious service: Not on file    Active member of club or organization: Not on file    Attends meetings of clubs or organizations: Not on file    Relationship status: Not on file  . Intimate partner violence:    Fear of current or ex partner: Not on file    Emotionally abused: Not on file    Physically abused: Not on file    Forced sexual activity: Not on file  Other Topics Concern  . Not on file  Social History Narrative   Single    Outpatient Encounter Medications as of 10/21/2018  Medication Sig  . betamethasone valerate ointment (VALISONE) 0.1 % Use a pea sized amount BID for 1-2 weeks as needed, not for daily use. (Patient not taking: Reported on 01/24/2018)  . meclizine (ANTIVERT) 25 MG tablet Take 1 tablet (25 mg total) by mouth 3 (three) times daily as needed for dizziness.  . metFORMIN (GLUCOPHAGE) 500 MG tablet Take 1 tablet (500 mg total) by mouth 2 (two) times daily with a meal. (Patient not taking: Reported on 01/24/2018)  . pravastatin (PRAVACHOL) 20 MG tablet Take 1 tablet (20 mg total) by mouth  daily.  . Vitamin D, Ergocalciferol, (DRISDOL) 50000 units CAPS capsule Take 1 capsule (50,000 Units total) by mouth every 7 (seven) days.  . [DISCONTINUED] meclizine (ANTIVERT) 25 MG tablet Take 1 tablet (25 mg total) by mouth 3 (three) times daily as needed for dizziness. (Patient not taking: Reported on 01/24/2018)   No facility-administered encounter medications on file as of 10/21/2018.     No Known Allergies  Review of Systems  Constitutional: Negative for chills, fatigue, fever and unexpected weight change.  Eyes: Negative for photophobia and visual disturbance.  Respiratory: Negative for cough, chest tightness and shortness of breath.   Cardiovascular: Negative for chest pain and palpitations.  Skin: Negative for color change and pallor.  Neurological: Positive for dizziness. Negative for tremors, seizures, syncope, facial asymmetry, speech difficulty, weakness, light-headedness, numbness and headaches.  Psychiatric/Behavioral: Negative for confusion.  All other systems reviewed  and are negative.        Observations/Objective: No vital signs or physical exam, this was a telephone or virtual health encounter.  Pt alert and oriented, answers all questions appropriately, and able to speak in full sentences.    Assessment and Plan: Heather Leach was seen today for dizziness.  Diagnoses and all orders for this visit:  Vertigo Pt aware to report any new, worsening, or persistent symptoms. Medications as prescribed. Pt aware of symptoms that require emergent treatment.  -     meclizine (ANTIVERT) 25 MG tablet; Take 1 tablet (25 mg total) by mouth 3 (three) times daily as needed for dizziness.     Follow Up Instructions: Return if symptoms worsen or fail to improve.    I discussed the assessment and treatment plan with the patient. The patient was provided an opportunity to ask questions and all were answered. The patient agreed with the plan and demonstrated an understanding of the  instructions.   The patient was advised to call back or seek an in-person evaluation if the symptoms worsen or if the condition fails to improve as anticipated.  The above assessment and management plan was discussed with the patient. The patient verbalized understanding of and has agreed to the management plan. Patient is aware to call the clinic if symptoms persist or worsen. Patient is aware when to return to the clinic for a follow-up visit. Patient educated on when it is appropriate to go to the emergency department.    I provided 15 minutes of non-face-to-face time during this encounter. The call started at 1115. The call ended at 1130.   Monia Pouch, FNP-C [facility] [address] [place], [place] [address] [phone]

## 2018-10-25 ENCOUNTER — Other Ambulatory Visit: Payer: Self-pay

## 2018-10-25 ENCOUNTER — Ambulatory Visit (INDEPENDENT_AMBULATORY_CARE_PROVIDER_SITE_OTHER): Payer: BLUE CROSS/BLUE SHIELD | Admitting: Family Medicine

## 2018-10-25 ENCOUNTER — Encounter: Payer: Self-pay | Admitting: Family Medicine

## 2018-10-25 DIAGNOSIS — R42 Dizziness and giddiness: Secondary | ICD-10-CM | POA: Diagnosis not present

## 2018-10-25 NOTE — Progress Notes (Signed)
Virtual Visit via telephone Note Due to COVID-19, visit is conducted virtually and was requested by patient. This visit type was conducted due to national recommendations for restrictions regarding the COVID-19 Pandemic (e.g. social distancing) in an effort to limit this patient's exposure and mitigate transmission in our community.  Due to her co-morbid illnesses, this patient is at least at moderate risk for complications without adequate follow up.  This format is felt to be most appropriate for this patient at this time.  All issues noted in this document were discussed and addressed.  A physical exam was not performed with this format.   I connected with Heather Leach on 10/25/18 at [place] by telephone and verified that I am speaking with the correct person using two identifiers. Heather Leach is currently located at home and family is currently with them during visit. The provider, Monia Pouch, FNP is located in their office at time of visit.  I discussed the limitations, risks, security and privacy concerns of performing an evaluation and management service by telephone and the availability of in person appointments. I also discussed with the patient that there may be a patient responsible charge related to this service. The patient expressed understanding and agreed to proceed.  Subjective:  Patient ID: Heather Leach, female    DOB: Jul 08, 1955, 63 y.o.   MRN: [id number]  Chief Complaint:  Dizziness   HPI: Heather Leach is a 63 y.o. female presenting on 10/25/2018 for Dizziness   Dizziness  This is a recurrent problem. The current episode started 1 to 4 weeks ago. The problem occurs intermittently. The problem has been waxing and waning. Associated symptoms include nausea and vertigo. Pertinent negatives include no chest pain, chills, fatigue, fever, headaches, numbness, visual change, vomiting or weakness. The symptoms are aggravated by standing, twisting and bending. Treatments tried: meclizine. The  treatment provided significant (drowsiness with medication) relief.     Relevant past medical, surgical, family, and social history reviewed and updated as indicated.  Allergies and medications reviewed and updated.   Past Medical History:  Diagnosis Date  . 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 Diabetes mellitus without complication (HCC)    diet controlled  . Fibroid   . Herpes simplex virus (HSV) infection   . Hx of colonic polyps 02/17/2018  . Hyperlipidemia   . Prediabetes   . Urinary frequency 10/28/2014  . Urinary incontinence 10/28/2014  . Vaginal itching 10/28/2014  . Vertigo     Past Surgical History:  Procedure Laterality Date  . ABDOMINAL HYSTERECTOMY N/A 11/16/2015   Procedure: TOTAL ABDOMINAL HYSTERECTOMY;  Surgeon: Salvadore Dom, MD;  Location: [facility];  Service: Gynecology;  Laterality: N/A;  . ABDOMINAL SACROCOLPOPEXY N/A 11/16/2015   Procedure: ABDOMINO SACROCOLPOPEXY HALBAN'S CULDOPLASTY ;  Surgeon: Nunzio Cobbs, MD;  Location: [facility];  Service: Gynecology;  Laterality: N/A;  . ANTERIOR AND POSTERIOR REPAIR N/A 11/16/2015   Procedure: ANTERIOR (CYSTOCELE) AND PERINEORRHAPHY;  Surgeon: Nunzio Cobbs, MD;  Location: [facility];  Service: Gynecology;  Laterality: N/A;  . BLADDER SUSPENSION N/A 11/16/2015   Procedure: TRANSVAGINAL TAPE (TVT) PROCEDURE MED URETHRAL SLING ;  Surgeon: Nunzio Cobbs, MD;  Location: [facility];  Service: Gynecology;  Laterality: N/A;  . BREAST BIOPSY    . CYSTOSCOPY N/A 11/16/2015   Procedure: CYSTOSCOPY;  Surgeon: Nunzio Cobbs, MD;  Location: [facility];  Service: Gynecology;  Laterality: N/A;  . SALPINGOOPHORECTOMY Bilateral 11/16/2015   Procedure: BILATERAL  SALPINGO OOPHORECTOMY;  Surgeon: Salvadore Dom, MD;  Location: [facility];  Service: Gynecology;  Laterality: Bilateral;  . TUBAL LIGATION    . WISDOM TOOTH EXTRACTION      Social History   Socioeconomic History  . Marital status: Single     Spouse name: Not on file  . Number of children: Not on file  . Years of education: Not on file  . Highest education level: Not on file  Occupational History  . Occupation: Works at [employer]: [place]  . Financial resource strain: Not on file  . Food insecurity:    Worry: Not on file    Inability: Not on file  . Transportation needs:    Medical: Not on file    Non-medical: Not on file  Tobacco Use  . Smoking status: Never Smoker  . Smokeless tobacco: Never Used  Substance and Sexual Activity  . Alcohol use: No    Alcohol/week: 0.0 standard drinks  . Drug use: No  . Sexual activity: Not Currently    Partners: Male    Birth control/protection: Post-menopausal, Surgical    Comment: tubal/hysterectomy  Lifestyle  . Physical activity:    Days per week: Not on file    Minutes per session: Not on file  . Stress: Not on file  Relationships  . Social connections:    Talks on phone: Not on file    Gets together: Not on file    Attends religious service: Not on file    Active member of club or organization: Not on file    Attends meetings of clubs or organizations: Not on file    Relationship status: Not on file  . Intimate partner violence:    Fear of current or ex partner: Not on file    Emotionally abused: Not on file    Physically abused: Not on file    Forced sexual activity: Not on file  Other Topics Concern  . Not on file  Social History Narrative   Single    Outpatient Encounter Medications as of 10/25/2018  Medication Sig  . betamethasone valerate ointment (VALISONE) 0.1 % Use a pea sized amount BID for 1-2 weeks as needed, not for daily use. (Patient not taking: Reported on 01/24/2018)  . meclizine (ANTIVERT) 25 MG tablet Take 1 tablet (25 mg total) by mouth 3 (three) times daily as needed for dizziness.  . metFORMIN (GLUCOPHAGE) 500 MG tablet Take 1 tablet (500 mg total) by mouth 2 (two) times daily with a meal. (Patient not  taking: Reported on 01/24/2018)  . pravastatin (PRAVACHOL) 20 MG tablet Take 1 tablet (20 mg total) by mouth daily.  . Vitamin D, Ergocalciferol, (DRISDOL) 50000 units CAPS capsule Take 1 capsule (50,000 Units total) by mouth every 7 (seven) days.   No facility-administered encounter medications on file as of 10/25/2018.     No Known Allergies  Review of Systems  Constitutional: Negative for chills, fatigue and fever.  Eyes: Negative for photophobia and visual disturbance.  Cardiovascular: Negative for chest pain.  Gastrointestinal: Positive for nausea. Negative for vomiting.  Neurological: Positive for dizziness and vertigo. Negative for tremors, seizures, syncope, facial asymmetry, speech difficulty, weakness, light-headedness, numbness and headaches.  Psychiatric/Behavioral: Negative for confusion.  All other systems reviewed and are negative.        Observations/Objective: No vital signs or physical exam, this was a telephone or virtual health encounter.  Pt alert and oriented, answers all  questions appropriately, and able to speak in full sentences.    Assessment and Plan: Videl was seen today for dizziness.  Diagnoses and all orders for this visit:  Vertigo Can try 12.5 mg of meclizine or non drowsy dramamine. Report any new or worsening symptoms.     Follow Up Instructions: Return if symptoms worsen or fail to improve.    I discussed the assessment and treatment plan with the patient. The patient was provided an opportunity to ask questions and all were answered. The patient agreed with the plan and demonstrated an understanding of the instructions.   The patient was advised to call back or seek an in-person evaluation if the symptoms worsen or if the condition fails to improve as anticipated.  The above assessment and management plan was discussed with the patient. The patient verbalized understanding of and has agreed to the management plan. Patient is aware to call  the clinic if symptoms persist or worsen. Patient is aware when to return to the clinic for a follow-up visit. Patient educated on when it is appropriate to go to the emergency department.    I provided 15 minutes of non-face-to-face time during this encounter. The call started at [place]. The call ended at 47.   Monia Pouch, FNP-C [facility] [address] [place], [place] [address] [phone]

## 2018-11-01 ENCOUNTER — Other Ambulatory Visit: Payer: Self-pay

## 2018-11-01 ENCOUNTER — Telehealth: Payer: Self-pay | Admitting: Nurse Practitioner

## 2018-11-01 ENCOUNTER — Ambulatory Visit: Payer: Self-pay | Admitting: Nurse Practitioner

## 2018-11-01 ENCOUNTER — Ambulatory Visit (INDEPENDENT_AMBULATORY_CARE_PROVIDER_SITE_OTHER): Payer: BLUE CROSS/BLUE SHIELD | Admitting: Nurse Practitioner

## 2018-11-01 ENCOUNTER — Encounter: Payer: Self-pay | Admitting: Nurse Practitioner

## 2018-11-01 VITALS — BP 138/82 | HR 69 | Temp 97.7°F | Ht 65.0 in | Wt 196.0 lb

## 2018-11-01 DIAGNOSIS — E1165 Type 2 diabetes mellitus with hyperglycemia: Secondary | ICD-10-CM | POA: Diagnosis not present

## 2018-11-01 LAB — GLUCOSE HEMOCUE WAIVED: Glu Hemocue Waived: >444 mg/dL (ref 65–99)

## 2018-11-01 LAB — BAYER DCA HB A1C WAIVED: HB A1C (BAYER DCA - WAIVED): 12.6 % — ABNORMAL HIGH (ref <7.0)

## 2018-11-01 MED ORDER — SITAGLIP PHOS-METFORMIN HCL ER 100-1000 MG PO TB24: 1.0000 | ORAL_TABLET | Freq: Every day | ORAL | 0 refills | Status: DC

## 2018-11-01 MED ORDER — BLOOD GLUCOSE METER KIT: PACK | 0 refills | Status: AC

## 2018-11-01 NOTE — Telephone Encounter (Signed)
contacted patient to check on her blood sugar. We had given her lantus 20u in the office and I told her to take janumet when she got home.  5:00 PM - I spoke with patient and she said her blood sugar was 339 which was down from >444.. I told her to limit her carb intake and eat some protein. 8:00PM- called patient to check on her. She had not checked blood sugar again. She said she was feeling much better. Not as thirsty and not going to rest room as often. I told her I would check on her in the morning.

## 2018-11-01 NOTE — Progress Notes (Signed)
   Subjective:    Patient ID: Heather Leach, female    DOB: February 19, 1956, 63 y.o.   MRN: [id number]   Chief Complaint: Blood sugar  will not read on machine (Felt bad for 2 weeks)   HPI patient says that she has felt  Bad for several weeks. She felt bad at work today. Went to nurse at work and they could not get a reading on her blood sugar. So she was told to come see her PCP. She has never had a machine  to check her blood sugars at home.    Review of Systems  Constitutional: Negative.   Respiratory: Negative.   Cardiovascular: Negative.   Gastrointestinal: Negative.   Neurological: Positive for dizziness. Negative for seizures, speech difficulty, weakness and headaches.  Psychiatric/Behavioral: Negative.   All other systems reviewed and are negative.      Objective:   Physical Exam Vitals signs and nursing note reviewed.  Constitutional:      Appearance: Normal appearance.  Cardiovascular:     Rate and Rhythm: Normal rate and regular rhythm.     Heart sounds: Normal heart sounds.  Pulmonary:     Effort: Pulmonary effort is normal.     Breath sounds: Normal breath sounds.  Abdominal:     General: Abdomen is flat. Bowel sounds are normal.     Palpations: Abdomen is soft.  Musculoskeletal: Normal range of motion.  Skin:    General: Skin is warm and dry.  Neurological:     General: No focal deficit present.     Mental Status: She is alert and oriented to person, place, and time.     Cranial Nerves: No cranial nerve deficit.     Sensory: No sensory deficit.    BP 138/82   Pulse 69   Temp 97.7 F (36.5 C) (Oral)   Ht 5\' 5"  (1.651 m)   Wt 196 lb (88.9 kg)   LMP 06/27/2011 (Approximate)   BMI 32.62 kg/m   BS >444  lantus 20u now  lOt #[id number]  [facility] [id number]       Assessment & Plan:  Heather Leach in today with chief complaint of Blood sugar  will not read on machine (Felt bad for 2 weeks)   1. Uncontrolled type 2 diabetes mellitus with hyperglycemia (HCC) Keep  diary of blood sugars Strict carb counting Information given on diabetes I will personally call and check on patient over the weekend - Bayer DCA Hb A1c Waived - Glucose Hemocue Waived - SitaGLIPtin-MetFORMIN HCl (JANUMET XR) [phone] MG TB24; Take 1 tablet by mouth daily.  Dispense: 30 tablet; Refill: 0  Mary-Margaret Hassell Done, FNP

## 2018-11-02 ENCOUNTER — Telehealth: Payer: Self-pay | Admitting: Nurse Practitioner

## 2018-11-02 NOTE — Telephone Encounter (Signed)
Blood sugar this morning was 310. Says she feels fine

## 2018-11-08 ENCOUNTER — Other Ambulatory Visit: Payer: Self-pay

## 2018-11-08 ENCOUNTER — Ambulatory Visit (INDEPENDENT_AMBULATORY_CARE_PROVIDER_SITE_OTHER): Payer: BLUE CROSS/BLUE SHIELD | Admitting: Nurse Practitioner

## 2018-11-08 ENCOUNTER — Encounter: Payer: Self-pay | Admitting: Nurse Practitioner

## 2018-11-08 DIAGNOSIS — E1165 Type 2 diabetes mellitus with hyperglycemia: Secondary | ICD-10-CM | POA: Diagnosis not present

## 2018-11-08 NOTE — Progress Notes (Signed)
   Virtual Visit via telephone Note  I connected with Heather Leach on 11/08/18 at 3:40 PM by telephone and verified that I am speaking with the correct person using two identifiers. Heather Leach is currently located at home and no one is currently with her during visit. The provider, Mary-Margaret Hassell Done, FNP is located in their office at time of visit.  I discussed the limitations, risks, security and privacy concerns of performing an evaluation and management service by telephone and the availability of in person appointments. I also discussed with the patient that there may be a patient responsible charge related to this service. The patient expressed understanding and agreed to proceed.   History and Present Illness:   Chief Complaint: Hyperglycemia   HPI Patient came into office last Friday with elevated blood sugar. Her blood sugar was greater then 444 and hgba1c was 12.2%. she was given a dose of lantus 20u and was started on janumet 100/1000XR daily. Over the weekend her blood sugars were gradually coming down into the 200. The last week her blood sugars have gradullay been coming down. Last 2 days has been in 170-190.    Review of Systems  Constitutional: Negative for diaphoresis and weight loss.  Eyes: Negative for blurred vision, double vision and pain.  Respiratory: Negative for shortness of breath.   Cardiovascular: Negative for chest pain, palpitations, orthopnea and leg swelling.  Gastrointestinal: Negative for abdominal pain.  Skin: Negative for rash.  Neurological: Negative for dizziness, sensory change, loss of consciousness, weakness and headaches.  Endo/Heme/Allergies: Negative for polydipsia. Does not bruise/bleed easily.  Psychiatric/Behavioral: Negative for memory loss. The patient does not have insomnia.   All other systems reviewed and are negative.    Observations/Objective: Alert and oriented- answers all questions appropriately  Assessment and Plan: Heather Leach in today with chief complaint of Hyperglycemia   1. Uncontrolled type 2 diabetes mellitus with hyperglycemia ([place]) Continue janumet as rx Continue to keep diary of blood sugars Watch carbs in diet exericse daily   Follow Up Instructions: Will follow up with telephone visit in June.- patient will call sooner if blood sugars are not good.    I discussed the assessment and treatment plan with the patient. The patient was provided an opportunity to ask questions and all were answered. The patient agreed with the plan and demonstrated an understanding of the instructions.   The patient was advised to call back or seek an in-person evaluation if the symptoms worsen or if the condition fails to improve as anticipated.  The above assessment and management plan was discussed with the patient. The patient verbalized understanding of and has agreed to the management plan. Patient is aware to call the clinic if symptoms persist or worsen. Patient is aware when to return to the clinic for a follow-up visit. Patient educated on when it is appropriate to go to the emergency department.   Time call ended:  3:55 PM I provided 15 minutes of non-face-to-face time during this encounter.    Mary-Margaret Hassell Done, FNP

## 2018-11-13 ENCOUNTER — Encounter: Payer: Self-pay | Admitting: Nurse Practitioner

## 2018-11-13 ENCOUNTER — Ambulatory Visit (INDEPENDENT_AMBULATORY_CARE_PROVIDER_SITE_OTHER): Payer: BLUE CROSS/BLUE SHIELD | Admitting: Nurse Practitioner

## 2018-11-13 ENCOUNTER — Other Ambulatory Visit: Payer: Self-pay

## 2018-11-13 DIAGNOSIS — H538 Other visual disturbances: Secondary | ICD-10-CM

## 2018-11-13 NOTE — Progress Notes (Signed)
   Virtual Visit via telephone Note  I connected with Heather Leach on 11/13/18 at 5:15 PM by telephone and verified that I am speaking with the correct person using two identifiers. Heather Leach is currently located at home and no one is currently with her during visit. The provider, Mary-Margaret Hassell Done, FNP is located in their office at time of visit.  I discussed the limitations, risks, security and privacy concerns of performing an evaluation and management service by telephone and the availability of in person appointments. I also discussed with the patient that there may be a patient responsible charge related to this service. The patient expressed understanding and agreed to proceed.   History and Present Illness:   Chief Complaint: Blurred Vision   HPI Patient calls in today c/o blurred vision. She noticed it Monday on her way to work. She says she can see but it is not as clear as usually is. Reading glasses . She says distance and close up are both blurry. Blood sugars have improved. The last 4 days the highest it has been is 143.   Review of Systems  Constitutional: Negative for diaphoresis and weight loss.  Eyes: Negative for blurred vision, double vision and pain.  Respiratory: Negative for shortness of breath.   Cardiovascular: Negative for chest pain, palpitations, orthopnea and leg swelling.  Gastrointestinal: Negative for abdominal pain.  Skin: Negative for rash.  Neurological: Negative for dizziness, sensory change, loss of consciousness, weakness and headaches.  Endo/Heme/Allergies: Negative for polydipsia. Does not bruise/bleed easily.  Psychiatric/Behavioral: Negative for memory loss. The patient does not have insomnia.   All other systems reviewed and are negative.    Observations/Objective: Alert and oriented No distress  Assessment and Plan: Heather Leach in today with chief complaint of Blurred Vision   1. Blurred vision, bilateral Needs to see eye doctor  May be coming from blood suagrs starting to get to normal Continue to keep diary of blood sugars.   Follow Up Instructions: prn   I discussed the assessment and treatment plan with the patient. The patient was provided an opportunity to ask questions and all were answered. The patient agreed with the plan and demonstrated an understanding of the instructions.   The patient was advised to call back or seek an in-person evaluation if the symptoms worsen or if the condition fails to improve as anticipated.  The above assessment and management plan was discussed with the patient. The patient verbalized understanding of and has agreed to the management plan. Patient is aware to call the clinic if symptoms persist or worsen. Patient is aware when to return to the clinic for a follow-up visit. Patient educated on when it is appropriate to go to the emergency department.   Time call ended:  5:15  I provided 15 minutes minutes of non-face-to-face time during this encounter.    Mary-Margaret Hassell Done, FNP

## 2018-11-28 ENCOUNTER — Other Ambulatory Visit: Payer: Self-pay

## 2018-11-28 ENCOUNTER — Ambulatory Visit (INDEPENDENT_AMBULATORY_CARE_PROVIDER_SITE_OTHER): Payer: BLUE CROSS/BLUE SHIELD | Admitting: Nurse Practitioner

## 2018-11-28 ENCOUNTER — Encounter: Payer: Self-pay | Admitting: Nurse Practitioner

## 2018-11-28 DIAGNOSIS — E1165 Type 2 diabetes mellitus with hyperglycemia: Secondary | ICD-10-CM | POA: Diagnosis not present

## 2018-11-28 MED ORDER — SITAGLIP PHOS-METFORMIN HCL ER 100-1000 MG PO TB24: 1.0000 | ORAL_TABLET | Freq: Every day | ORAL | 1 refills | Status: DC

## 2018-11-28 NOTE — Progress Notes (Signed)
   Virtual Visit via telephone Note  I connected with Heather Leach on 11/28/18 at 10:45 AM by telephone and verified that I am speaking with the correct person using two identifiers. Heather Leach is currently located at home and non one is currently with her during visit. The provider, Mary-Margaret Hassell Done, FNP is located in their office at time of visit.  I discussed the limitations, risks, security and privacy concerns of performing an evaluation and management service by telephone and the availability of in person appointments. I also discussed with the patient that there may be a patient responsible charge related to this service. The patient expressed understanding and agreed to proceed.   History and Present Illness:  Patient was seen in office on 11/01/18 with elevated blood sugars. Her hgba1c was 12.6% with blood sugar over 400. She was changed for metformin to janumet 100/100 XR. Had telephone visit follow up on 11/13/18 and was doing better. Was having some blurred vision we think was due to the dropping of her blood sugar to normal range. Today her visit is to just check on her and see how she is doing and how blood sugars are. Now her fasting blood sugars are running between 120-130's consistently. She is feeling much better. Vision has improved. She is planning to get eye exam this month.   Review of Systems  Constitutional: Negative for diaphoresis and weight loss.  Eyes: Negative for blurred vision, double vision and pain.  Respiratory: Negative for shortness of breath.   Cardiovascular: Negative for chest pain, palpitations, orthopnea and leg swelling.  Gastrointestinal: Negative for abdominal pain.  Skin: Negative for rash.  Neurological: Negative for dizziness, sensory change, loss of consciousness, weakness and headaches.  Endo/Heme/Allergies: Negative for polydipsia. Does not bruise/bleed easily.  Psychiatric/Behavioral: Negative for memory loss. The patient does not have insomnia.    All other systems reviewed and are negative.    Observations/Objective: Alert and oriented  no distress  Assessment and Plan: Heather Leach comes in today with chief complaint of Diabetes   Diagnosis and orders addressed:  1. Uncontrolled type 2 diabetes mellitus with hyperglycemia (HCC) continue to monitor carbs in diet Get eye exam - SitaGLIPtin-MetFORMIN HCl (JANUMET XR) [phone] MG TB24; Take 1 tablet by mouth daily.  Dispense: 90 tablet; Refill: 1   Labs pending Health Maintenance reviewed Diet and exercise encouraged  Follow up plan: 3 months     I discussed the assessment and treatment plan with the patient. The patient was provided an opportunity to ask questions and all were answered. The patient agreed with the plan and demonstrated an understanding of the instructions.   The patient was advised to call back or seek an in-person evaluation if the symptoms worsen or if the condition fails to improve as anticipated.  The above assessment and management plan was discussed with the patient. The patient verbalized understanding of and has agreed to the management plan. Patient is aware to call the clinic if symptoms persist or worsen. Patient is aware when to return to the clinic for a follow-up visit. Patient educated on when it is appropriate to go to the emergency department.   Time call ended:  11:00  I provided 15 minutes of non-face-to-face time during this encounter.    Mary-Margaret Hassell Done, FNP

## 2018-11-29 MED ORDER — SITAGLIP PHOS-METFORMIN HCL ER 100-1000 MG PO TB24: 1.0000 | ORAL_TABLET | Freq: Every day | ORAL | 1 refills | Status: DC

## 2018-11-29 NOTE — Addendum Note (Signed)
Addended by: Rolena Infante on: 11/29/2018 11:12 AM   Modules accepted: Orders

## 2018-12-10 ENCOUNTER — Ambulatory Visit: Payer: BLUE CROSS/BLUE SHIELD | Admitting: Obstetrics and Gynecology

## 2018-12-20 ENCOUNTER — Encounter: Payer: BLUE CROSS/BLUE SHIELD | Admitting: Family Medicine

## 2019-02-27 ENCOUNTER — Other Ambulatory Visit: Payer: Self-pay

## 2019-02-28 ENCOUNTER — Ambulatory Visit: Payer: Self-pay | Admitting: Nurse Practitioner

## 2019-06-06 DIAGNOSIS — N390 Urinary tract infection, site not specified: Secondary | ICD-10-CM | POA: Diagnosis not present

## 2019-06-06 DIAGNOSIS — Z6829 Body mass index (BMI) 29.0-29.9, adult: Secondary | ICD-10-CM | POA: Diagnosis not present

## 2019-06-06 DIAGNOSIS — R8290 Unspecified abnormal findings in urine: Secondary | ICD-10-CM | POA: Diagnosis not present

## 2019-10-15 ENCOUNTER — Other Ambulatory Visit: Payer: Self-pay

## 2019-10-15 ENCOUNTER — Encounter: Payer: Self-pay | Admitting: Nurse Practitioner

## 2019-10-15 ENCOUNTER — Ambulatory Visit (INDEPENDENT_AMBULATORY_CARE_PROVIDER_SITE_OTHER): Payer: BC Managed Care – PPO | Admitting: Nurse Practitioner

## 2019-10-15 ENCOUNTER — Ambulatory Visit (INDEPENDENT_AMBULATORY_CARE_PROVIDER_SITE_OTHER): Payer: BC Managed Care – PPO

## 2019-10-15 VITALS — BP 133/77 | HR 57 | Temp 97.4°F | Ht 65.0 in | Wt 194.2 lb

## 2019-10-15 DIAGNOSIS — I10 Essential (primary) hypertension: Secondary | ICD-10-CM

## 2019-10-15 DIAGNOSIS — E876 Hypokalemia: Secondary | ICD-10-CM

## 2019-10-15 DIAGNOSIS — Z0000 Encounter for general adult medical examination without abnormal findings: Secondary | ICD-10-CM

## 2019-10-15 DIAGNOSIS — M1711 Unilateral primary osteoarthritis, right knee: Secondary | ICD-10-CM | POA: Diagnosis not present

## 2019-10-15 DIAGNOSIS — E785 Hyperlipidemia, unspecified: Secondary | ICD-10-CM

## 2019-10-15 DIAGNOSIS — E1165 Type 2 diabetes mellitus with hyperglycemia: Secondary | ICD-10-CM | POA: Diagnosis not present

## 2019-10-15 DIAGNOSIS — M25561 Pain in right knee: Secondary | ICD-10-CM

## 2019-10-15 DIAGNOSIS — E669 Obesity, unspecified: Secondary | ICD-10-CM

## 2019-10-15 DIAGNOSIS — Z0001 Encounter for general adult medical examination with abnormal findings: Secondary | ICD-10-CM | POA: Diagnosis not present

## 2019-10-15 DIAGNOSIS — E1169 Type 2 diabetes mellitus with other specified complication: Secondary | ICD-10-CM | POA: Diagnosis not present

## 2019-10-15 DIAGNOSIS — N3946 Mixed incontinence: Secondary | ICD-10-CM

## 2019-10-15 LAB — BAYER DCA HB A1C WAIVED: HB A1C (BAYER DCA - WAIVED): 7.1 % — ABNORMAL HIGH (ref <7.0)

## 2019-10-15 MED ORDER — MELOXICAM 15 MG PO TABS: 15.0000 mg | ORAL_TABLET | Freq: Every day | ORAL | 2 refills | Status: AC

## 2019-10-15 MED ORDER — ATORVASTATIN CALCIUM 40 MG PO TABS: 40.0000 mg | ORAL_TABLET | Freq: Every day | ORAL | 1 refills | Status: DC

## 2019-10-15 MED ORDER — SITAGLIPTIN PHOSPHATE 100 MG PO TABS: 100.0000 mg | ORAL_TABLET | Freq: Every day | ORAL | 1 refills | Status: DC

## 2019-10-15 NOTE — Progress Notes (Signed)
Subjective:    Patient ID: Heather Leach, female    DOB: 07/11/1955, 64 y.o.   MRN: [id number]   Chief Complaint: Annual Exam    HPI:  1. Annual physical exam  2. Hyperlipidemia associated with type 2 diabetes mellitus ([place]) Tries to wtach diet but has not been doing very well. Does no dedicated exercise. Lab Results  Component Value Date   CHOL 226 (H) 11/26/2017   HDL 41 11/26/2017   LDLCALC 153 (H) 11/26/2017   TRIG 160 (H) 11/26/2017   CHOLHDL 5.5 (H) 11/26/2017     3. Essential hypertension No c/o chest pain, sob or headache. Does not check blood pressure at home. BP Readings from Last 3 Encounters:  11/01/18 138/82  02/11/18 (!) 107/54  12/17/17 122/77     4. Type 2 diabetes mellitus with hyperglycemia, without long-term current use of insulin (HCC) She does not check blood sugars everyday. She says the last 3 weeks they have been over 200. She has not been watching diet. She denies any low blood sugars. She stopped taking janumet because it was bothering her. She had bad diarrhea and felt bad. She did not call and let us know that she stopped taking. Lab Results  Component Value Date   HGBA1C 12.6 (H) 11/01/2018    5. Hypokalemia No c/o lower ext cramping. Lab Results  Component Value Date   K 4.1 11/26/2017     6. Mixed stress and urge urinary incontinence She denies anyproblems going to the restroom.  7. Obesity (BMI 30.0-34.9) No recent weight changes Wt Readings from Last 3 Encounters:  10/15/19 194 lb 3.2 oz (88.1 kg)  11/01/18 196 lb (88.9 kg)  02/11/18 198 lb (89.8 kg)   BMI Readings from Last 3 Encounters:  10/15/19 32.32 kg/m  11/01/18 32.62 kg/m  02/11/18 32.95 kg/m      Outpatient Encounter Medications as of 10/15/2019  Medication Sig  . betamethasone valerate ointment (VALISONE) 0.1 % Use a pea sized amount BID for 1-2 weeks as needed, not for daily use. (Patient not taking: Reported on 01/24/2018)  . blood glucose meter kit and  supplies Dispense based on patient and insurance preference. Use up to four times daily as directed. (FOR ICD-10 E10.9, E11.9).  . meclizine (ANTIVERT) 25 MG tablet Take 1 tablet (25 mg total) by mouth 3 (three) times daily as needed for dizziness.  . pravastatin (PRAVACHOL) 20 MG tablet Take 1 tablet (20 mg total) by mouth daily.  . SitaGLIPtin-MetFORMIN HCl (JANUMET XR) [phone] MG TB24 Take 1 tablet by mouth daily.     Past Surgical History:  Procedure Laterality Date  . ABDOMINAL HYSTERECTOMY N/A 11/16/2015   Procedure: TOTAL ABDOMINAL HYSTERECTOMY;  Surgeon: Salvadore Dom, MD;  Location: [facility];  Service: Gynecology;  Laterality: N/A;  . ABDOMINAL SACROCOLPOPEXY N/A 11/16/2015   Procedure: ABDOMINO SACROCOLPOPEXY HALBAN'S CULDOPLASTY ;  Surgeon: Nunzio Cobbs, MD;  Location: [facility];  Service: Gynecology;  Laterality: N/A;  . ANTERIOR AND POSTERIOR REPAIR N/A 11/16/2015   Procedure: ANTERIOR (CYSTOCELE) AND PERINEORRHAPHY;  Surgeon: Nunzio Cobbs, MD;  Location: [facility];  Service: Gynecology;  Laterality: N/A;  . BLADDER SUSPENSION N/A 11/16/2015   Procedure: TRANSVAGINAL TAPE (TVT) PROCEDURE MED URETHRAL SLING ;  Surgeon: Nunzio Cobbs, MD;  Location: [facility];  Service: Gynecology;  Laterality: N/A;  . BREAST BIOPSY    . CYSTOSCOPY N/A 11/16/2015   Procedure: CYSTOSCOPY;  Surgeon: Vicenta Dunning  Quincy Simmonds, MD;  Location: [facility];  Service: Gynecology;  Laterality: N/A;  . SALPINGOOPHORECTOMY Bilateral 11/16/2015   Procedure: BILATERAL SALPINGO OOPHORECTOMY;  Surgeon: Salvadore Dom, MD;  Location: [facility];  Service: Gynecology;  Laterality: Bilateral;  . TUBAL LIGATION    . WISDOM TOOTH EXTRACTION      Family History  Problem Relation Age of Onset  . Heart failure Mother   . Diabetes Mother   . Aneurysm Father   . Diabetes Maternal Grandmother   . Diabetes Other        mom's siblings, maternal cousins  . Colon cancer Neg Hx   . Colon polyps  Neg Hx   . Esophageal cancer Neg Hx   . Pancreatic cancer Neg Hx   . Rectal cancer Neg Hx   . Stomach cancer Neg Hx     New complaints: Right knee pan, started about 3 weeks ago. Hurst when sitting and worse when standing. She has motrin whih helps  Social history: Works at [employer] substance contract: n/a    Review of Systems  Constitutional: Negative for diaphoresis.  Eyes: Negative for pain.  Respiratory: Negative for shortness of breath.   Cardiovascular: Negative for chest pain, palpitations and leg swelling.  Gastrointestinal: Negative for abdominal pain.  Endocrine: Negative for polydipsia.  Musculoskeletal: Positive for arthralgias (right knee).  Skin: Negative for rash.  Neurological: Negative for dizziness, weakness and headaches.  Hematological: Does not bruise/bleed easily.  All other systems reviewed and are negative.      Objective:   Physical Exam Vitals and nursing note reviewed.  Constitutional:      General: She is not in acute distress.    Appearance: Normal appearance. She is well-developed.  HENT:     Head: Normocephalic.     Nose: Nose normal.  Eyes:     Pupils: Pupils are equal, round, and reactive to light.  Neck:     Vascular: No carotid bruit or JVD.  Cardiovascular:     Rate and Rhythm: Normal rate and regular rhythm.     Heart sounds: Normal heart sounds.  Pulmonary:     Effort: Pulmonary effort is normal. No respiratory distress.     Breath sounds: Normal breath sounds. No wheezing or rales.  Chest:     Chest Siers: No tenderness.  Abdominal:     General: Bowel sounds are normal. There is no distension or abdominal bruit.     Palpations: Abdomen is soft. There is no hepatomegaly, splenomegaly, mass or pulsatile mass.     Tenderness: There is no abdominal tenderness.  Musculoskeletal:        General: Normal range of motion.     Cervical back: Normal range of motion and neck supple.  Lymphadenopathy:      Cervical: No cervical adenopathy.  Skin:    General: Skin is warm and dry.  Neurological:     Mental Status: She is alert and oriented to person, place, and time.     Deep Tendon Reflexes: Reflexes are normal and symmetric.  Psychiatric:        Behavior: Behavior normal.        Thought Content: Thought content normal.        Judgment: Judgment normal.    BP 133/77   Pulse (!) 57   Temp (!) 97.4 F (36.3 C) (Temporal)   Ht 5' 5"  (1.651 m)   Wt 194 lb 3.2 oz (88.1 kg)   LMP 06/27/2011 (Approximate)  SpO2 96%   BMI 32.32 kg/m   HGBA1c 7.1%  Right knee xray- mild osteoarthritis laterally-Preliminary reading by Ronnald Collum, FNP  [facility]      Assessment & Plan:  Heather Leach comes in today with chief complaint of Annual Exam and Knee Pain (right pain)   Diagnosis and orders addressed:  1. Annual physical exam  2. Hyperlipidemia associated with type 2 diabetes mellitus (HCC) Low fat diet - Lipid panel - atorvastatin (LIPITOR) 40 MG tablet; Take 1 tablet (40 mg total) by mouth daily.  Dispense: 90 tablet; Refill: 1  3. Essential hypertension Low sodium diet - CBC with Differential/Platelet - CMP14+EGFR  4. Type 2 diabetes mellitus with hyperglycemia, without long-term current use of insulin (HCC) Added januvia back to meds - Microalbumin / creatinine urine ratio - Bayer DCA Hb A1c Waived - sitaGLIPtin (JANUVIA) 100 MG tablet; Take 1 tablet (100 mg total) by mouth daily.  Dispense: 90 tablet; Refill: 1  5. Hypokalemia Labs pending  6. Mixed stress and urge urinary incontinence No issues right now  7. Obesity (BMI 30.0-34.9) Discussed diet and exercise for person with BMI >25 Will recheck weight in 3-6 months  8. Acute pain of right knee Added mobic daily Wear knee wrap when working If no better in couple of weeks will do ortho referral - DG Knee 1-2 Views Right; Future - meloxicam (MOBIC) 15 MG tablet; Take 1 tablet (15 mg total) by mouth daily.  Dispense: 30  tablet; Refill: 2   Labs pending Health Maintenance reviewed Diet and exercise encouraged  Follow up plan: 3 months   Mary-Margaret Hassell Done, FNP

## 2019-10-15 NOTE — Patient Instructions (Signed)
Diabetes Mellitus and Foot Care Foot care is an important part of your health, especially when you have diabetes. Diabetes may cause you to have problems because of poor blood flow (circulation) to your feet and legs, which can cause your skin to:  Become thinner and drier.  Break more easily.  Heal more slowly.  Peel and crack. You may also have nerve damage (neuropathy) in your legs and feet, causing decreased feeling in them. This means that you may not notice minor injuries to your feet that could lead to more serious problems. Noticing and addressing any potential problems early is the best way to prevent future foot problems. How to care for your feet Foot hygiene  Wash your feet daily with warm water and mild soap. Do not use hot water. Then, pat your feet and the areas between your toes until they are completely dry. Do not soak your feet as this can dry your skin.  Trim your toenails straight across. Do not dig under them or around the cuticle. File the edges of your nails with an emery board or nail file.  Apply a moisturizing lotion or petroleum jelly to the skin on your feet and to dry, brittle toenails. Use lotion that does not contain alcohol and is unscented. Do not apply lotion between your toes. Shoes and socks  Wear clean socks or stockings every day. Make sure they are not too tight. Do not wear knee-high stockings since they may decrease blood flow to your legs.  Wear shoes that fit properly and have enough cushioning. Always look in your shoes before you put them on to be sure there are no objects inside.  To break in new shoes, wear them for just a few hours a day. This prevents injuries on your feet. Wounds, scrapes, corns, and calluses  Check your feet daily for blisters, cuts, bruises, sores, and redness. If you cannot see the bottom of your feet, use a mirror or ask someone for help.  Do not cut corns or calluses or try to remove them with medicine.  If you  find a minor scrape, cut, or break in the skin on your feet, keep it and the skin around it clean and dry. You may clean these areas with mild soap and water. Do not clean the area with peroxide, alcohol, or iodine.  If you have a wound, scrape, corn, or callus on your foot, look at it several times a day to make sure it is healing and not infected. Check for: ? Redness, swelling, or pain. ? Fluid or blood. ? Warmth. ? Pus or a bad smell. General instructions  Do not cross your legs. This may decrease blood flow to your feet.  Do not use heating pads or hot water bottles on your feet. They may burn your skin. If you have lost feeling in your feet or legs, you may not know this is happening until it is too late.  Protect your feet from hot and cold by wearing shoes, such as at the beach or on hot pavement.  Schedule a complete foot exam at least once a year (annually) or more often if you have foot problems. If you have foot problems, report any cuts, sores, or bruises to your health care provider immediately. Contact a health care provider if:  You have a medical condition that increases your risk of infection and you have any cuts, sores, or bruises on your feet.  You have an injury that is not   healing.  You have redness on your legs or feet.  You feel burning or tingling in your legs or feet.  You have pain or cramps in your legs and feet.  Your legs or feet are numb.  Your feet always feel cold.  You have pain around a toenail. Get help right away if:  You have a wound, scrape, corn, or callus on your foot and: ? You have pain, swelling, or redness that gets worse. ? You have fluid or blood coming from the wound, scrape, corn, or callus. ? Your wound, scrape, corn, or callus feels warm to the touch. ? You have pus or a bad smell coming from the wound, scrape, corn, or callus. ? You have a fever. ? You have a red line going up your leg. Summary  Check your feet every day  for cuts, sores, red spots, swelling, and blisters.  Moisturize feet and legs daily.  Wear shoes that fit properly and have enough cushioning.  If you have foot problems, report any cuts, sores, or bruises to your health care provider immediately.  Schedule a complete foot exam at least once a year (annually) or more often if you have foot problems. This information is not intended to replace advice given to you by your health care provider. Make sure you discuss any questions you have with your health care provider. Document Revised: 03/05/2019 Document Reviewed: 07/14/2016 Elsevier Patient Education  2020 Elsevier Inc.  

## 2019-10-16 LAB — CBC WITH DIFFERENTIAL/PLATELET

## 2019-10-16 LAB — CMP14+EGFR
ALT: 8 IU/L (ref 0–32)
AST: 13 IU/L (ref 0–40)
Albumin/Globulin Ratio: 1.6 (ref 1.2–2.2)
Albumin: 4.4 g/dL (ref 3.8–4.8)
Alkaline Phosphatase: 86 IU/L (ref 39–117)
BUN/Creatinine Ratio: 17 (ref 12–28)
BUN: 20 mg/dL (ref 8–27)
Bilirubin Total: 0.8 mg/dL (ref 0.0–1.2)
CO2: 21 mmol/L (ref 20–29)
Calcium: 9.8 mg/dL (ref 8.7–10.3)
Chloride: 104 mmol/L (ref 96–106)
Creatinine, Ser: 1.19 mg/dL — ABNORMAL HIGH (ref 0.57–1.00)
GFR calc Af Amer: 57 mL/min/{1.73_m2} — ABNORMAL LOW (ref >59)
GFR calc non Af Amer: 49 mL/min/{1.73_m2} — ABNORMAL LOW (ref >59)
Globulin, Total: 2.8 g/dL (ref 1.5–4.5)
Glucose: 131 mg/dL — ABNORMAL HIGH (ref 65–99)
Potassium: 4.2 mmol/L (ref 3.5–5.2)
Sodium: 141 mmol/L (ref 134–144)
Total Protein: 7.2 g/dL (ref 6.0–8.5)

## 2019-10-16 LAB — LIPID PANEL
Chol/HDL Ratio: 5.5 ratio — ABNORMAL HIGH (ref 0.0–4.4)
Cholesterol, Total: 240 mg/dL — ABNORMAL HIGH (ref 100–199)
HDL: 44 mg/dL (ref >39)
LDL Chol Calc (NIH): 160 mg/dL — ABNORMAL HIGH (ref 0–99)
Triglycerides: 195 mg/dL — ABNORMAL HIGH (ref 0–149)
VLDL Cholesterol Cal: 36 mg/dL (ref 5–40)

## 2019-10-16 LAB — MICROALBUMIN / CREATININE URINE RATIO
Creatinine, Urine: 85 mg/dL
Microalb/Creat Ratio: <4 mg/g creat (ref 0–29)
Microalbumin, Urine: <3 ug/mL

## 2019-10-30 ENCOUNTER — Ambulatory Visit: Payer: BLUE CROSS/BLUE SHIELD | Admitting: Orthopaedic Surgery

## 2019-11-13 ENCOUNTER — Encounter: Payer: Self-pay | Admitting: Orthopaedic Surgery

## 2019-11-13 ENCOUNTER — Ambulatory Visit (INDEPENDENT_AMBULATORY_CARE_PROVIDER_SITE_OTHER): Payer: BC Managed Care – PPO | Admitting: Orthopaedic Surgery

## 2019-11-13 ENCOUNTER — Other Ambulatory Visit: Payer: Self-pay

## 2019-11-13 DIAGNOSIS — M659 Synovitis and tenosynovitis, unspecified: Secondary | ICD-10-CM

## 2019-11-13 DIAGNOSIS — M25561 Pain in right knee: Secondary | ICD-10-CM | POA: Diagnosis not present

## 2019-11-13 MED ORDER — BUPIVACAINE HCL 0.25 % IJ SOLN
4.0000 mL | INTRAMUSCULAR | Status: AC | PRN
  Administered 2019-11-13: 4 mL via INTRA_ARTICULAR

## 2019-11-13 MED ORDER — METHYLPREDNISOLONE ACETATE 40 MG/ML IJ SUSP
40.0000 mg | INTRAMUSCULAR | Status: AC | PRN
  Administered 2019-11-13: 40 mg via INTRA_ARTICULAR

## 2019-11-13 MED ORDER — LIDOCAINE HCL 1 % IJ SOLN
0.5000 mL | INTRAMUSCULAR | Status: AC | PRN
  Administered 2019-11-13: .5 mL

## 2019-11-13 NOTE — Progress Notes (Signed)
Office Visit Note   Patient: Heather Leach           Date of Birth: Mar 23, 1956           MRN: [id number] Visit Date: 11/13/2019              Requested by: Chevis Pretty, [address] [place],  [place] [address] PCP: Chevis Pretty, FNP   Assessment & Plan: Visit Diagnoses:  1. Synovitis of right knee     Plan: We discussed patient she may have medial meniscal tear likely degenerative from her history.  She is not better with the injection done today we may need to consider diagnostic MRI imaging.  She states her pain is severe she is requesting proceeding with injection hoping she can get some relief.  Recheck 4 weeks.  She will call if she feels like she cannot work and needs a work note.  Follow-Up Instructions: Return in about 4 weeks (around 12/11/2019).   Orders:  No orders of the defined types were placed in this encounter.  No orders of the defined types were placed in this encounter.     Procedures: Large Joint Inj: R knee on 11/13/2019 2:44 PM Indications: pain and joint swelling Details: 22 G 1.5 in needle, anterolateral approach  Arthrogram: No  Medications: 40 mg methylPREDNISolone acetate 40 MG/ML; 0.5 mL lidocaine 1 %; 4 mL bupivacaine 0.25 % Outcome: tolerated well, no immediate complications Procedure, treatment alternatives, risks and benefits explained, specific risks discussed. Consent was given by the patient. Immediately prior to procedure a time out was called to verify the correct patient, procedure, equipment, support staff and site/side marked as required. Patient was prepped and draped in the usual sterile fashion.       Clinical Data: No additional findings.   Subjective: Chief Complaint  Patient presents with  . Right Knee - Pain    HPI 64 year old female seen with right knee pain severe present for 2 weeks she is barely able to walk.  She works on [employer] 8-hour shifts were still toed boots since been taking  arthritis medicine ice anti-inflammatories topical creams all without relief.  She has a trip she has to go up at work has to go up 1 step at a time when she ambulates she does not bring her right knee all the way out into full extension denies left foot pain no history of gout no problems with other joints.  She does have hyperlipidemia type 2 diabetes.  Review of Systems positive for obesity BMI 31 she is actually lost weight.  Positive for hypertension diabetes and a previous hysterectomy without anesthetic problems.  14 point systems otherwise negative is obtains HPI.   Objective: Vital Signs: BP 118/74   Pulse (!) 59   Ht 5' 6.5" (1.689 m)   Wt 196 lb (88.9 kg)   LMP 06/27/2011 (Approximate)   BMI 31.16 kg/m   Physical Exam Constitutional:      Appearance: She is well-developed.  HENT:     Head: Normocephalic.     Right Ear: External ear normal.     Left Ear: External ear normal.  Eyes:     Pupils: Pupils are equal, round, and reactive to light.  Neck:     Thyroid: No thyromegaly.     Trachea: No tracheal deviation.  Cardiovascular:     Rate and Rhythm: Normal rate.  Pulmonary:     Effort: Pulmonary effort is normal.  Abdominal:  Palpations: Abdomen is soft.  Skin:    General: Skin is warm and dry.  Neurological:     Mental Status: She is alert and oriented to person, place, and time.  Psychiatric:        Behavior: Behavior normal.     Ortho Exam patient has significant pain along the medial joint line at the medial collateral ligament posterior medial joint line.  She lacks 10 degrees reaching full extension has sharp pain with attempted full extension along the medial joint line no lateral joint line tenderness some pain with patellofemoral loading mild crepitus with knee flexion extension negative logroll the hips.  Negative popliteal compression test.  Specialty Comments:  No specialty comments available.  Imaging: No results found.   PMFS  History: Patient Active Problem List   Diagnosis Date Noted  . Synovitis of right knee 11/13/2019  . Hyperlipidemia associated with type 2 diabetes mellitus ([place]) 10/15/2019  . Hx of colonic polyps 02/17/2018  . Diabetes ([place]) 03/09/2016  . Status post total abdominal hysterectomy and bilateral salpingo-oophorectomy 11/16/2015  . Healthcare maintenance 10/28/2015  . Essential hypertension 08/19/2015  . Hypokalemia 08/11/2015  . Obesity (BMI 30.0-34.9) 06/12/2015  . Tinea corporis 06/12/2015  . Urinary incontinence 10/28/2014  . PRECORDIAL PAIN 05/18/2010  . ABNORMAL STRESS ELECTROCARDIOGRAM 05/18/2010   Past Medical History:  Diagnosis Date  . 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 Diabetes mellitus without complication (HCC)    diet controlled  . Fibroid   . Herpes simplex virus (HSV) infection   . Hx of colonic polyps 02/17/2018  . Hyperlipidemia   . Prediabetes   . Urinary frequency 10/28/2014  . Urinary incontinence 10/28/2014  . Vaginal itching 10/28/2014  . Vertigo     Family History  Problem Relation Age of Onset  . Heart failure Mother   . Diabetes Mother   . Aneurysm Father   . Diabetes Maternal Grandmother   . Diabetes Other        mom's siblings, maternal cousins  . Colon cancer Neg Hx   . Colon polyps Neg Hx   . Esophageal cancer Neg Hx   . Pancreatic cancer Neg Hx   . Rectal cancer Neg Hx   . Stomach cancer Neg Hx     Past Surgical History:  Procedure Laterality Date  . ABDOMINAL HYSTERECTOMY N/A 11/16/2015   Procedure: TOTAL ABDOMINAL HYSTERECTOMY;  Surgeon: Salvadore Dom, MD;  Location: [facility];  Service: Gynecology;  Laterality: N/A;  . ABDOMINAL SACROCOLPOPEXY N/A 11/16/2015   Procedure: ABDOMINO SACROCOLPOPEXY HALBAN'S CULDOPLASTY ;  Surgeon: Nunzio Cobbs, MD;  Location: [facility];  Service: Gynecology;  Laterality: N/A;  . ANTERIOR AND POSTERIOR REPAIR N/A 11/16/2015   Procedure: ANTERIOR (CYSTOCELE) AND PERINEORRHAPHY;  Surgeon: Nunzio Cobbs, MD;  Location: [facility];  Service: Gynecology;  Laterality: N/A;  . BLADDER SUSPENSION N/A 11/16/2015   Procedure: TRANSVAGINAL TAPE (TVT) PROCEDURE MED URETHRAL SLING ;  Surgeon: Nunzio Cobbs, MD;  Location: [facility];  Service: Gynecology;  Laterality: N/A;  . BREAST BIOPSY    . CYSTOSCOPY N/A 11/16/2015   Procedure: CYSTOSCOPY;  Surgeon: Nunzio Cobbs, MD;  Location: [facility];  Service: Gynecology;  Laterality: N/A;  . SALPINGOOPHORECTOMY Bilateral 11/16/2015   Procedure: BILATERAL SALPINGO OOPHORECTOMY;  Surgeon: Salvadore Dom, MD;  Location: [facility];  Service: Gynecology;  Laterality: Bilateral;  . TUBAL LIGATION    . [place] EXTRACTION     Social  History   Occupational History  . Occupation: Works at [employer]: [employer]  Tobacco Use  . Smoking status: Never Smoker  . Smokeless tobacco: Never Used  Substance and Sexual Activity  . Alcohol use: No    Alcohol/week: 0.0 standard drinks  . Drug use: No  . Sexual activity: Not Currently    Partners: Male    Birth control/protection: Post-menopausal, Surgical    Comment: tubal/hysterectomy

## 2019-12-01 ENCOUNTER — Telehealth: Payer: Self-pay | Admitting: Radiology

## 2019-12-01 DIAGNOSIS — M659 Synovitis and tenosynovitis, unspecified: Secondary | ICD-10-CM

## 2019-12-01 NOTE — Telephone Encounter (Signed)
Patient states that she is having continued right knee pain and she was told to call if no better after two weeks for [place] to order MRI.  Per your last note, if no better, you would consider MRI imaging.   Please advise on order.

## 2019-12-02 ENCOUNTER — Telehealth: Payer: Self-pay | Admitting: Orthopaedic Surgery

## 2019-12-02 NOTE — Telephone Encounter (Signed)
I left voicemail for patient advising MRI order entered and after insurance authorization is received, someone will call to schedule. Also advised she will need to make follow up appt in [place] office after scan for review.  Sabrina--this is not one that was ordered while in [place] so we will need to work on scheduling. It will be done at [facility]. Thanks.

## 2019-12-02 NOTE — Addendum Note (Signed)
Addended by: Meyer Cory on: 12/02/2019 09:38 AM   Modules accepted: Orders

## 2019-12-02 NOTE — Telephone Encounter (Signed)
OK proceed with MRI

## 2019-12-02 NOTE — Telephone Encounter (Signed)
Patient called.   Did not disclose why but she is requesting a call back from [place].   Call back: [phone]

## 2019-12-03 NOTE — Telephone Encounter (Signed)
I spoke with patient. She would prefer MRI be done at [facility] so that her information stays within the [facility] network. She asked about [facility], and I advised we could do it there but she would be responsible for getting CD of images and bringing to appt. She does not wish to do that. Gabriel Cirri will get MRI location changed.

## 2019-12-03 NOTE — Telephone Encounter (Signed)
Per Gwinda Passe, pt called her insurance and they will not approve for her to go to [facility] regional for MRI, pt has requested to go to [facility] instead. Orders has been sent to April P with scheduling, they will contact pt to schedule appt.

## 2019-12-03 NOTE — Addendum Note (Signed)
Addended by: Daylene Posey T on: 12/03/2019 10:37 AM   Modules accepted: Orders

## 2019-12-04 ENCOUNTER — Other Ambulatory Visit: Payer: Self-pay

## 2019-12-04 ENCOUNTER — Ambulatory Visit (HOSPITAL_COMMUNITY)
Admission: RE | Admit: 2019-12-04 | Discharge: 2019-12-04 | Disposition: A | Discharge status: Home / Self Care | Payer: BC Managed Care – PPO | Source: Ambulatory Visit | Attending: Orthopaedic Surgery | Admitting: Orthopaedic Surgery

## 2019-12-04 DIAGNOSIS — M25561 Pain in right knee: Secondary | ICD-10-CM | POA: Diagnosis not present

## 2019-12-04 DIAGNOSIS — M659 Synovitis and tenosynovitis, unspecified: Secondary | ICD-10-CM | POA: Diagnosis not present

## 2019-12-09 ENCOUNTER — Telehealth: Payer: Self-pay | Admitting: Orthopaedic Surgery

## 2019-12-09 MED ORDER — ACETAMINOPHEN-CODEINE #3 300-30 MG PO TABS: 1.0000 | ORAL_TABLET | Freq: Two times a day (BID) | ORAL | 0 refills | Status: DC | PRN

## 2019-12-09 NOTE — Telephone Encounter (Signed)
Please advise 

## 2019-12-09 NOTE — Telephone Encounter (Signed)
Patient called requesting pain medication until seen for MRI Review appointment in [place]. Patient states she is in pain and need something to tie her over until appointment. Patient asked for medication to go to [facility] in [place]. Patient phone number is [phone]. Please call patient when medication has been sent in

## 2019-12-09 NOTE — Telephone Encounter (Signed)
I called to pharmacy. I called patient and advised. 

## 2019-12-09 NOTE — Telephone Encounter (Signed)
OK tylenol # 3  one po bid prn pain # 20 thanks

## 2019-12-09 NOTE — Telephone Encounter (Signed)
Can you help? Patient states that she has not received bill from her last visit. She would like to know if she owes something and what her copay is? She requests return call with info. Thanks.

## 2019-12-18 ENCOUNTER — Other Ambulatory Visit: Payer: Self-pay

## 2019-12-18 ENCOUNTER — Ambulatory Visit (INDEPENDENT_AMBULATORY_CARE_PROVIDER_SITE_OTHER): Payer: BC Managed Care – PPO | Admitting: Orthopaedic Surgery

## 2019-12-18 ENCOUNTER — Encounter: Payer: Self-pay | Admitting: Orthopaedic Surgery

## 2019-12-18 DIAGNOSIS — M1711 Unilateral primary osteoarthritis, right knee: Secondary | ICD-10-CM | POA: Diagnosis not present

## 2019-12-18 MED ORDER — DICLOFENAC SODIUM 75 MG PO TBEC
75.0000 mg | DELAYED_RELEASE_TABLET | Freq: Two times a day (BID) | ORAL | 2 refills | Status: DC
Start: 2019-12-18 — End: 2020-10-22

## 2019-12-18 NOTE — Progress Notes (Signed)
Office Visit Note   Patient: Heather Leach           Date of Birth: 10-May-1956           MRN: [id number] Visit Date: 12/18/2019              Requested by: Chevis Pretty, [address] [place],  [place] [address] PCP: Chevis Pretty, FNP   Assessment & Plan: Visit Diagnoses:  1. Unilateral primary osteoarthritis, right knee     Plan: Patient had been on Mobic she has had an intra-articular injection.  She is off Mobic now we will try some Voltaren 75 mg p.o. twice daily recheck in a month.  I discussed with her that the arthritic wear in her knee is significant enough that is unlikely the knee arthroscopy would give her much relief with partial lateral meniscectomy for macerated complex lateral meniscal tear.  She understands and total knee arthroplasty will likely be necessary to give her relief.  I will see her back again in 1 month.  She can continue to use some ice elevation intermittently and obtain a knee sleeve.  She can use intermittently.  Follow-Up Instructions: Return in about 1 month (around 01/17/2020).   Orders:  No orders of the defined types were placed in this encounter.  Meds ordered this encounter  Medications   diclofenac (VOLTAREN) 75 MG EC tablet    Sig: Take 1 tablet (75 mg total) by mouth 2 (two) times daily.    Dispense:  30 tablet    Refill:  2      Procedures: No procedures performed   Clinical Data: No additional findings.   Subjective: Chief Complaint  Patient presents with   Right Knee - Follow-up, Pain    MRI Right knee review    HPI 64 year old female returns she states the injection 11/13/2019 did not give her any relief.  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  She is use ice and elevation.  She denies any problems with her opposite left knee.  MRI scan has been  obtained which shows maceration of the anterior horn lateral meniscus, marginal osteophytes and areas of cartilage degeneration worse patellofemoral and lateral compartment.  Significant knee effusion was noted on the MRI scan as well as small to moderate sized Baker's cyst.  Review of Systems update no change in review of systems from 11/13/2019 office note all systems updated.   Objective: Vital Signs: BP 131/83    Pulse (!) 58    Ht 5' 6.5" (1.689 m)    Wt 196 lb (88.9 kg)    LMP 06/27/2011 (Approximate)    BMI 31.16 kg/m   Physical Exam Constitutional:      Appearance: She is well-developed.  HENT:     Head: Normocephalic.     Right Ear: External ear normal.     Left Ear: External ear normal.  Eyes:     Pupils: Pupils are equal, round, and reactive to light.  Neck:     Thyroid: No thyromegaly.     Trachea: No tracheal deviation.  Cardiovascular:     Rate and Rhythm: Normal rate.  Pulmonary:     Effort: Pulmonary effort is normal.  Abdominal:     Palpations: Abdomen is soft.  Skin:    General: Skin is warm and dry.  Neurological:     Mental Status: She is alert and oriented to person, place, and time.  Psychiatric:        Behavior: Behavior normal.     Ortho Exam extension 3+ fusion over knee pain with patellofemoral loading lateral joint line tenderness.  Baker's cyst not palpable.  Distal pulses are intact negative logroll the hip she is amatory with a right knee limp.  Specialty Comments:  No specialty comments available.  Imaging: CLINICAL DATA:  Right knee pain for 1 month.  No known injury.  EXAM: MRI OF THE RIGHT KNEE WITHOUT CONTRAST  TECHNIQUE: Multiplanar, multisequence MR imaging of the knee was performed. No intravenous contrast was administered.  COMPARISON:  Plain films right knee 10/15/2019.  FINDINGS: MENISCI  Medial meniscus:  Intact.  Lateral meniscus: The bulk of the anterior horn is not visualized consistent with degenerative  maceration. There is some fraying along the free edge of the body.  LIGAMENTS  Cruciates:  Intact.  Collaterals:  Intact.  CARTILAGE  Patellofemoral: Cartilage loss is worst along the lateral patellar facet in the mid and lower pole.  Medial:  Mildly to moderately degenerated.  No focal defect.  Lateral: Moderate to moderately severe thinned without focal defect.  Joint:  Moderate joint effusion.  Popliteal Fossa: Baker's cyst measures approximately 1.3 cm transverse by 1 cm AP by 2.1 cm craniocaudal.  Extensor Mechanism:  Intact.  Bones: Osteophytosis is present about the knee. There is some subchondral edema in the lateral patellar facet and about the periphery of the lateral compartment.  Other: None.  IMPRESSION: Osteoarthritis about the knee is most advanced in the lateral and patellofemoral compartments.  The anterior horn of the lateral meniscus is not visualized consistent with degenerative maceration.   Electronically Signed   By: Inge Rise M.D.   On: 12/05/2019 11:42   PMFS History: Patient Active Problem List   Diagnosis Date Noted   Unilateral primary osteoarthritis, right knee 12/18/2019   Synovitis of right knee 11/13/2019   Hyperlipidemia associated with type 2 diabetes mellitus ([facility]) 10/15/2019   Hx of colonic polyps 02/17/2018   Diabetes ([place]) 03/09/2016   Status post total abdominal hysterectomy and bilateral salpingo-oophorectomy 11/16/2015   Healthcare maintenance 10/28/2015   Essential hypertension 08/19/2015   Hypokalemia 08/11/2015   Obesity (BMI 30.0-34.9) 06/12/2015   Tinea corporis 06/12/2015   Urinary incontinence 10/28/2014   PRECORDIAL PAIN 05/18/2010   ABNORMAL STRESS ELECTROCARDIOGRAM 05/18/2010   Past Medical History:  Diagnosis Date   Complete uterine prolapse 10/28/2014   Diabetes mellitus without complication (HCC)    diet controlled   Fibroid    Herpes simplex virus (HSV)  infection    Hx of colonic polyps 02/17/2018   Hyperlipidemia    Prediabetes    Urinary frequency 10/28/2014   Urinary incontinence 10/28/2014   Vaginal itching 10/28/2014   Vertigo     Family History  Problem Relation Age of Onset   Heart failure Mother    Diabetes Mother    Aneurysm Father    Diabetes Maternal Grandmother    Diabetes Other        mom's siblings, maternal cousins   Colon cancer Neg Hx    Colon polyps Neg Hx    Esophageal cancer Neg Hx    Pancreatic cancer Neg Hx    Rectal cancer Neg Hx    Stomach cancer Neg Hx     Past Surgical History:  Procedure Laterality Date   ABDOMINAL HYSTERECTOMY N/A 11/16/2015  Procedure: TOTAL ABDOMINAL HYSTERECTOMY;  Surgeon: Salvadore Dom, MD;  Location: [facility];  Service: Gynecology;  Laterality: N/A;   ABDOMINAL SACROCOLPOPEXY N/A 11/16/2015   Procedure: ABDOMINO SACROCOLPOPEXY HALBAN'S CULDOPLASTY ;  Surgeon: Nunzio Cobbs, MD;  Location: [facility];  Service: Gynecology;  Laterality: N/A;   ANTERIOR AND POSTERIOR REPAIR N/A 11/16/2015   Procedure: ANTERIOR (CYSTOCELE) AND PERINEORRHAPHY;  Surgeon: Nunzio Cobbs, MD;  Location: [facility];  Service: Gynecology;  Laterality: N/A;   BLADDER SUSPENSION N/A 11/16/2015   Procedure: TRANSVAGINAL TAPE (TVT) PROCEDURE MED URETHRAL SLING ;  Surgeon: Nunzio Cobbs, MD;  Location: [facility];  Service: Gynecology;  Laterality: N/A;   BREAST BIOPSY     CYSTOSCOPY N/A 11/16/2015   Procedure: CYSTOSCOPY;  Surgeon: Nunzio Cobbs, MD;  Location: [facility];  Service: Gynecology;  Laterality: N/A;   SALPINGOOPHORECTOMY Bilateral 11/16/2015   Procedure: BILATERAL SALPINGO OOPHORECTOMY;  Surgeon: Salvadore Dom, MD;  Location: [facility];  Service: Gynecology;  Laterality: Bilateral;   TUBAL LIGATION     WISDOM TOOTH EXTRACTION     Social History   Occupational History   Occupation: Works at [employer]: [place]    Tobacco Use   Smoking status: Never Smoker   Smokeless tobacco: Never Used  Vaping Use   Vaping Use: Never used  Substance and Sexual Activity   Alcohol use: No    Alcohol/week: 0.0 standard drinks   Drug use: No   Sexual activity: Not Currently    Partners: Male    Birth control/protection: Post-menopausal, Surgical    Comment: tubal/hysterectomy

## 2020-01-15 ENCOUNTER — Other Ambulatory Visit: Payer: Self-pay

## 2020-01-15 ENCOUNTER — Ambulatory Visit: Payer: BC Managed Care – PPO | Admitting: Orthopaedic Surgery

## 2020-01-23 ENCOUNTER — Ambulatory Visit: Payer: Self-pay | Admitting: Nurse Practitioner

## 2020-10-18 ENCOUNTER — Other Ambulatory Visit: Payer: Self-pay | Admitting: Nurse Practitioner

## 2020-10-18 DIAGNOSIS — E1165 Type 2 diabetes mellitus with hyperglycemia: Secondary | ICD-10-CM

## 2020-10-20 ENCOUNTER — Telehealth: Payer: Self-pay

## 2020-10-20 DIAGNOSIS — E1165 Type 2 diabetes mellitus with hyperglycemia: Secondary | ICD-10-CM

## 2020-10-20 NOTE — Telephone Encounter (Signed)
  Prescription Request  10/20/2020  What is the name of the medication or equipment? Januvia  Have you contacted your pharmacy to request a refill? (if applicable) yes  Which pharmacy would you like this sent to? [facility]   Patient notified that their request is being sent to the clinical staff for review and that they should receive a response within 2 business days.   MMM's pt.  She is completely out.

## 2020-10-20 NOTE — Telephone Encounter (Signed)
Appt made for Friday 10/22/20, pt has not been seen since 10/15/19

## 2020-10-22 ENCOUNTER — Ambulatory Visit (INDEPENDENT_AMBULATORY_CARE_PROVIDER_SITE_OTHER): Payer: BC Managed Care – PPO | Admitting: Nurse Practitioner

## 2020-10-22 ENCOUNTER — Other Ambulatory Visit: Payer: Self-pay

## 2020-10-22 ENCOUNTER — Encounter: Payer: Self-pay | Admitting: Nurse Practitioner

## 2020-10-22 VITALS — BP 136/82 | HR 59 | Temp 97.2°F | Resp 20 | Ht 66.0 in | Wt 192.0 lb

## 2020-10-22 DIAGNOSIS — N3946 Mixed incontinence: Secondary | ICD-10-CM | POA: Diagnosis not present

## 2020-10-22 DIAGNOSIS — I10 Essential (primary) hypertension: Secondary | ICD-10-CM | POA: Diagnosis not present

## 2020-10-22 DIAGNOSIS — E1165 Type 2 diabetes mellitus with hyperglycemia: Secondary | ICD-10-CM

## 2020-10-22 DIAGNOSIS — E785 Hyperlipidemia, unspecified: Secondary | ICD-10-CM

## 2020-10-22 DIAGNOSIS — E1169 Type 2 diabetes mellitus with other specified complication: Secondary | ICD-10-CM | POA: Diagnosis not present

## 2020-10-22 DIAGNOSIS — E669 Obesity, unspecified: Secondary | ICD-10-CM

## 2020-10-22 DIAGNOSIS — E876 Hypokalemia: Secondary | ICD-10-CM

## 2020-10-22 LAB — BAYER DCA HB A1C WAIVED: HB A1C (BAYER DCA - WAIVED): 7.7 % — ABNORMAL HIGH (ref <7.0)

## 2020-10-22 MED ORDER — ATORVASTATIN CALCIUM 40 MG PO TABS: 40.0000 mg | ORAL_TABLET | Freq: Every day | ORAL | 1 refills | Status: AC

## 2020-10-22 MED ORDER — SITAGLIPTIN PHOSPHATE 100 MG PO TABS: 100.0000 mg | ORAL_TABLET | Freq: Every day | ORAL | 1 refills | Status: AC

## 2020-10-22 NOTE — Progress Notes (Signed)
Subjective:    Patient ID: Heather Leach, female    DOB: [birth year], 65 y.o.   MRN: [id number]   Chief Complaint: medical management of chronic issues     HPI:  1. Essential hypertension No c/o cgest pain, sob or headache. Does not check blood pressure at home. BP Readings from Last 3 Encounters:  12/18/19 131/83  11/13/19 118/74  10/15/19 133/77     2. Hyperlipidemia associated with type 2 diabetes mellitus ([place]) Has not ben watching diet and doing no exercise. Is not taking her lipitor at all.. Lab Results  Component Value Date   CHOL 240 (H) 10/15/2019   HDL 44 10/15/2019   LDLCALC 160 (H) 10/15/2019   TRIG 195 (H) 10/15/2019   CHOLHDL 5.5 (H) 10/15/2019     3. Hypokalemia Is not currently on potassium supplement Lab Results  Component Value Date   K 4.2 10/15/2019     4. Mixed stress and urge urinary incontinence She has seen urology and had her bladder tacked up already. She needs not have done again  5. diabetes Fasting blood sugars are running around 150-190. She denies any low blood sugars.  Lab Results  Component Value Date   HGBA1C 7.1 (H) 10/15/2019     6. Obesity (BMI 30.0-34.9) No recent weight changes. Wt Readings from Last 3 Encounters:  10/22/20 192 lb (87.1 kg)  12/18/19 196 lb (88.9 kg)  11/13/19 196 lb (88.9 kg)   BMI Readings from Last 3 Encounters:  10/22/20 30.99 kg/m  12/18/19 31.16 kg/m  11/13/19 31.16 kg/m       Outpatient Encounter Medications as of 10/22/2020  Medication Sig  . acetaminophen-codeine (TYLENOL #3) 300-30 MG tablet Take 1 tablet by mouth 2 (two) times daily as needed for moderate pain.  Marland Kitchen atorvastatin (LIPITOR) 40 MG tablet Take 1 tablet (40 mg total) by mouth daily.  . blood glucose meter kit and supplies Dispense based on patient and insurance preference. Use up to four times daily as directed. (FOR ICD-10 E10.9, E11.9).  Marland Kitchen Blood Glucose Monitoring Suppl (GLUCOCOM BLOOD GLUCOSE MONITOR) DEVI Dispense  based on patient and insurance preference. Use up to four times daily as directed. (FOR ICD-10 E10.9, E11.9).  Marland Kitchen diclofenac (VOLTAREN) 75 MG EC tablet Take 1 tablet (75 mg total) by mouth 2 (two) times daily.  . meclizine (ANTIVERT) 25 MG tablet Take 1 tablet (25 mg total) by mouth 3 (three) times daily as needed for dizziness. (Patient not taking: Reported on 10/15/2019)  . sitaGLIPtin (JANUVIA) 100 MG tablet Take 1 tablet (100 mg total) by mouth daily.   No facility-administered encounter medications on file as of 10/22/2020.    Past Surgical History:  Procedure Laterality Date  . ABDOMINAL HYSTERECTOMY N/A 11/16/2015   Procedure: TOTAL ABDOMINAL HYSTERECTOMY;  Surgeon: Salvadore Dom, MD;  Location: [facility];  Service: Gynecology;  Laterality: N/A;  . ABDOMINAL SACROCOLPOPEXY N/A 11/16/2015   Procedure: ABDOMINO SACROCOLPOPEXY HALBAN'S CULDOPLASTY ;  Surgeon: Nunzio Cobbs, MD;  Location: [facility];  Service: Gynecology;  Laterality: N/A;  . ANTERIOR AND POSTERIOR REPAIR N/A 11/16/2015   Procedure: ANTERIOR (CYSTOCELE) AND PERINEORRHAPHY;  Surgeon: Nunzio Cobbs, MD;  Location: [facility];  Service: Gynecology;  Laterality: N/A;  . BLADDER SUSPENSION N/A 11/16/2015   Procedure: TRANSVAGINAL TAPE (TVT) PROCEDURE MED URETHRAL SLING ;  Surgeon: Nunzio Cobbs, MD;  Location: [facility];  Service: Gynecology;  Laterality: N/A;  . BREAST BIOPSY    .  CYSTOSCOPY N/A 11/16/2015   Procedure: CYSTOSCOPY;  Surgeon: Nunzio Cobbs, MD;  Location: [facility];  Service: Gynecology;  Laterality: N/A;  . SALPINGOOPHORECTOMY Bilateral 11/16/2015   Procedure: BILATERAL SALPINGO OOPHORECTOMY;  Surgeon: Salvadore Dom, MD;  Location: [facility];  Service: Gynecology;  Laterality: Bilateral;  . TUBAL LIGATION    . WISDOM TOOTH EXTRACTION      Family History  Problem Relation Age of Onset  . Heart failure Mother   . Diabetes Mother   . Aneurysm Father   . Diabetes Maternal  Grandmother   . Diabetes Other        mom's siblings, maternal cousins  . Colon cancer Neg Hx   . Colon polyps Neg Hx   . Esophageal cancer Neg Hx   . Pancreatic cancer Neg Hx   . Rectal cancer Neg Hx   . Stomach cancer Neg Hx     New complaints: None today  Social history: Lives by herself  Controlled substance contract: n/a    Review of Systems  Constitutional: Negative for diaphoresis.  Eyes: Negative for pain.  Respiratory: Negative for shortness of breath.   Cardiovascular: Negative for chest pain, palpitations and leg swelling.  Gastrointestinal: Negative for abdominal pain.  Endocrine: Negative for polydipsia.  Skin: Negative for rash.  Neurological: Negative for dizziness, weakness and headaches.  Hematological: Does not bruise/bleed easily.  All other systems reviewed and are negative.      Objective:   Physical Exam Vitals and nursing note reviewed.  Constitutional:      General: She is not in acute distress.    Appearance: Normal appearance. She is well-developed.  HENT:     Head: Normocephalic.     Nose: Nose normal.  Eyes:     Pupils: Pupils are equal, round, and reactive to light.  Neck:     Vascular: No carotid bruit or JVD.  Cardiovascular:     Rate and Rhythm: Normal rate and regular rhythm.     Heart sounds: Normal heart sounds.  Pulmonary:     Effort: Pulmonary effort is normal. No respiratory distress.     Breath sounds: Normal breath sounds. No wheezing or rales.  Chest:     Chest Zafar: No tenderness.  Abdominal:     General: Bowel sounds are normal. There is no distension or abdominal bruit.     Palpations: Abdomen is soft. There is no hepatomegaly, splenomegaly, mass or pulsatile mass.     Tenderness: There is no abdominal tenderness.  Musculoskeletal:        General: Normal range of motion.     Cervical back: Normal range of motion and neck supple.  Lymphadenopathy:     Cervical: No cervical adenopathy.  Skin:    General: Skin  is warm and dry.  Neurological:     Mental Status: She is alert and oriented to person, place, and time.     Deep Tendon Reflexes: Reflexes are normal and symmetric.  Psychiatric:        Behavior: Behavior normal.        Thought Content: Thought content normal.        Judgment: Judgment normal.    BP 136/82   Pulse (!) 59   Temp (!) 97.2 F (36.2 C) (Temporal)   Resp 20   Ht 5' 6"  (1.676 m)   Wt 192 lb (87.1 kg)   LMP 06/27/2011 (Approximate)   SpO2 98%   BMI 30.99 kg/m     hgba1c  7.7%     Assessment & Plan:  Heather Leach comes in today with chief complaint of Medical Management of Chronic Issues   Diagnosis and orders addressed:  1. Essential hypertension Low sodium diet - CBC with Differential/Platelet - CMP14+EGFR  2. Hyperlipidemia associated with type 2 diabetes mellitus (HCC) Low fat diet - Lipid panel - atorvastatin (LIPITOR) 40 MG tablet; Take 1 tablet (40 mg total) by mouth daily.  Dispense: 90 tablet; Refill: 1  3. Hypokalemia Labs pending  4. Mixed stress and urge urinary incontinence Keep follow with cardiology  5. Obesity (BMI 30.0-34.9) Discussed diet and exercise for person with BMI >25 Will recheck weight in 3-6 months  6. Type 2 diabetes mellitus with hyperglycemia, without long-term current use of insulin (HCC) Stricter carb counting - Bayer DCA Hb A1c Waived - Microalbumin / creatinine urine ratio - sitaGLIPtin (JANUVIA) 100 MG tablet; Take 1 tablet (100 mg total) by mouth daily.  Dispense: 90 tablet; Refill: 1   Labs pending Health Maintenance reviewed Diet and exercise encouraged  Follow up plan: 3 months   Mary-Margaret Hassell Done, FNP

## 2020-10-22 NOTE — Patient Instructions (Signed)

## 2020-10-23 LAB — CMP14+EGFR
ALT: 10 IU/L (ref 0–32)
AST: 11 IU/L (ref 0–40)
Albumin/Globulin Ratio: 1.3 (ref 1.2–2.2)
Albumin: 4.3 g/dL (ref 3.8–4.8)
Alkaline Phosphatase: 82 IU/L (ref 44–121)
BUN/Creatinine Ratio: 14 (ref 12–28)
BUN: 18 mg/dL (ref 8–27)
Bilirubin Total: 0.8 mg/dL (ref 0.0–1.2)
CO2: 20 mmol/L (ref 20–29)
Calcium: 9.7 mg/dL (ref 8.7–10.3)
Chloride: 104 mmol/L (ref 96–106)
Creatinine, Ser: 1.25 mg/dL — ABNORMAL HIGH (ref 0.57–1.00)
Globulin, Total: 3.2 g/dL (ref 1.5–4.5)
Glucose: 147 mg/dL — ABNORMAL HIGH (ref 65–99)
Potassium: 4.1 mmol/L (ref 3.5–5.2)
Sodium: 140 mmol/L (ref 134–144)
Total Protein: 7.5 g/dL (ref 6.0–8.5)
eGFR: 48 mL/min/{1.73_m2} — ABNORMAL LOW (ref >59)

## 2020-10-23 LAB — LIPID PANEL
Chol/HDL Ratio: 6.4 ratio — ABNORMAL HIGH (ref 0.0–4.4)
Cholesterol, Total: 270 mg/dL — ABNORMAL HIGH (ref 100–199)
HDL: 42 mg/dL (ref >39)
LDL Chol Calc (NIH): 203 mg/dL — ABNORMAL HIGH (ref 0–99)
Triglycerides: 134 mg/dL (ref 0–149)
VLDL Cholesterol Cal: 25 mg/dL (ref 5–40)

## 2020-10-23 LAB — CBC WITH DIFFERENTIAL/PLATELET
Basophils Absolute: 0.1 10*3/uL (ref 0.0–0.2)
Basos: 1 %
EOS (ABSOLUTE): 0.2 10*3/uL (ref 0.0–0.4)
Eos: 3 %
Hematocrit: 46.9 % — ABNORMAL HIGH (ref 34.0–46.6)
Hemoglobin: 15.7 g/dL (ref 11.1–15.9)
Immature Grans (Abs): 0 10*3/uL (ref 0.0–0.1)
Immature Granulocytes: 0 %
Lymphocytes Absolute: 4.1 10*3/uL — ABNORMAL HIGH (ref 0.7–3.1)
Lymphs: 63 %
MCH: 26.8 pg (ref 26.6–33.0)
MCHC: 33.5 g/dL (ref 31.5–35.7)
MCV: 80 fL (ref 79–97)
Monocytes Absolute: 0.4 10*3/uL (ref 0.1–0.9)
Monocytes: 6 %
Neutrophils Absolute: 1.8 10*3/uL (ref 1.4–7.0)
Neutrophils: 27 %
Platelets: 236 10*3/uL (ref 150–450)
RBC: 5.86 x10E6/uL — ABNORMAL HIGH (ref 3.77–5.28)
RDW: 13.1 % (ref 11.7–15.4)
WBC: 6.5 10*3/uL (ref 3.4–10.8)

## 2020-12-18 ENCOUNTER — Other Ambulatory Visit: Payer: Self-pay | Admitting: Nurse Practitioner

## 2020-12-18 DIAGNOSIS — Z1231 Encounter for screening mammogram for malignant neoplasm of breast: Secondary | ICD-10-CM

## 2021-01-21 ENCOUNTER — Ambulatory Visit: Payer: Self-pay | Admitting: Nurse Practitioner

## 2021-05-05 IMAGING — MR MR KNEE*R* W/O CM
4 of 7 series · 16 of 40 positions shown · non-contrast
Comparison: Plain films right knee 10/15/2019.

CLINICAL DATA: Right knee pain for 1 month.  No known injury.

EXAM:
MRI OF THE RIGHT KNEE WITHOUT CONTRAST
TECHNIQUE: Multiplanar, multisequence MR imaging of the knee was performed. No
intravenous contrast was administered.

[Series 3: t2fs axial · axial · 4.0mm · 0.27mm/px · z∈[-85,+30]mm · 4 of 24 slices shown]
[im 1/24]
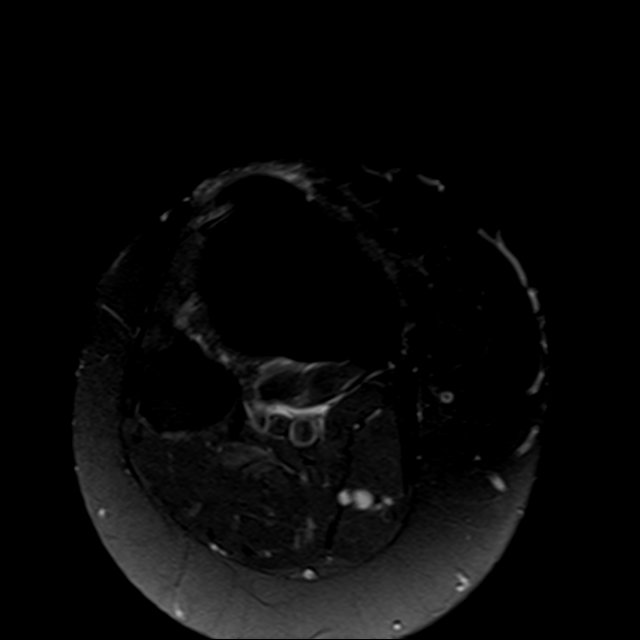
[im 5/24]
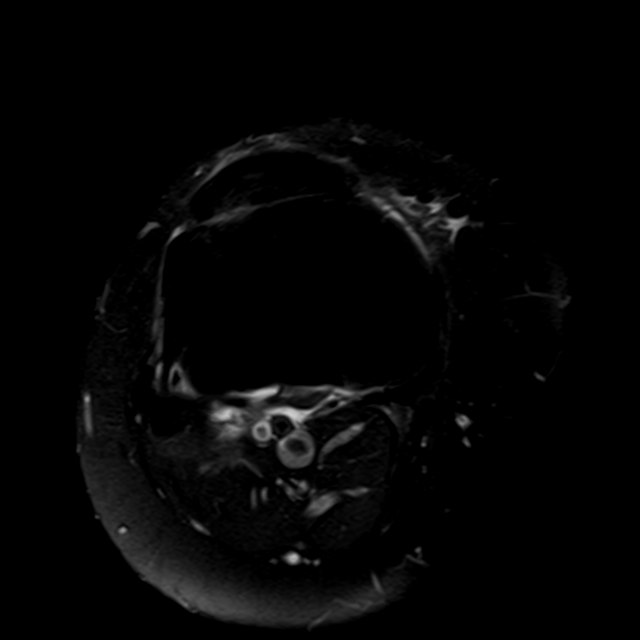
[im 14/24]
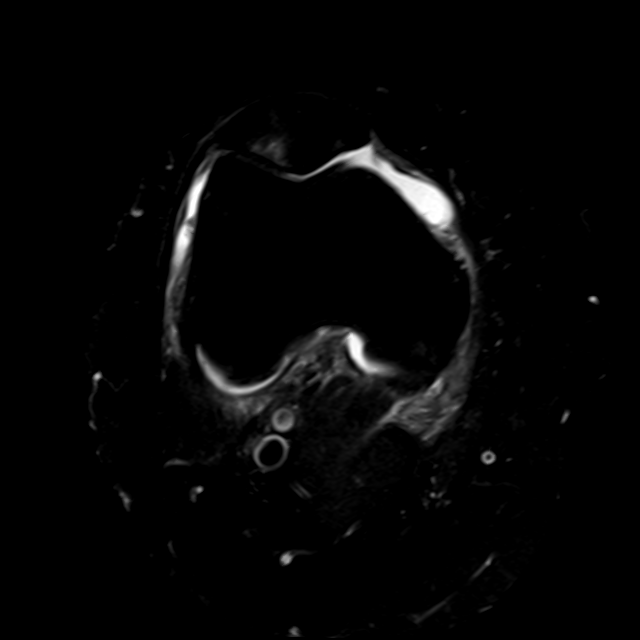
[im 24/24]
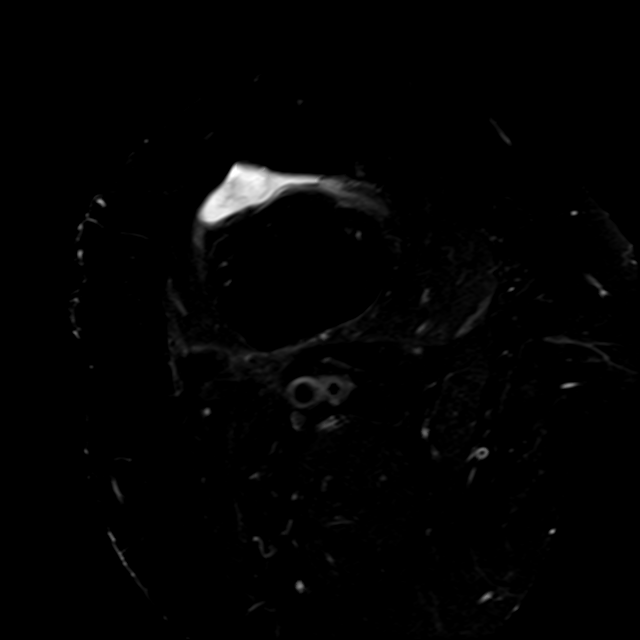

[Series 4: T1 · coronal · 4.0mm · 0.30mm/px · 6 of 30 slices shown]
[im 1/30]
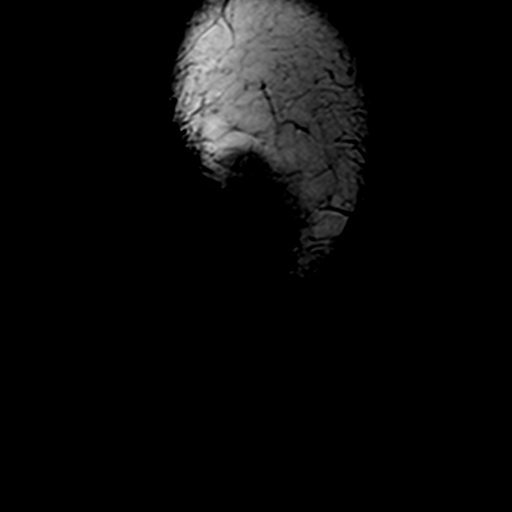
[im 6/30]
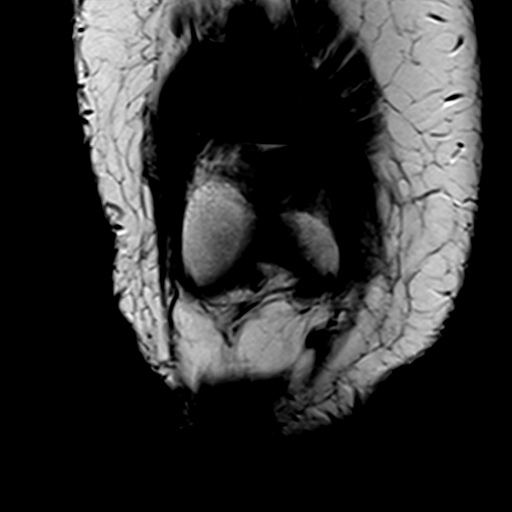
[im 12/30]
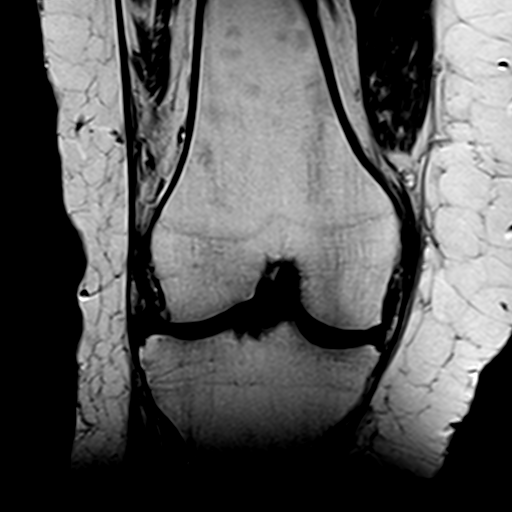
[im 18/30]
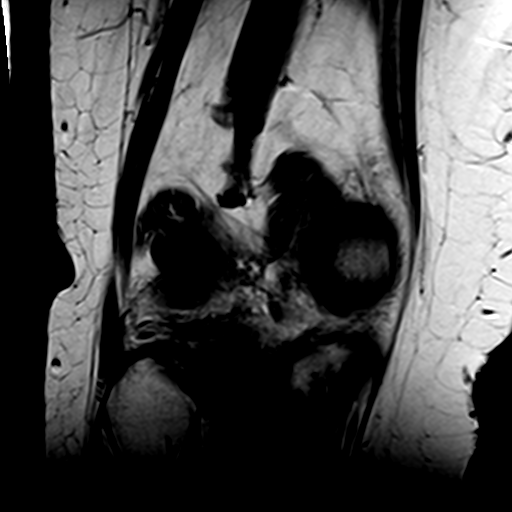
[im 24/30]
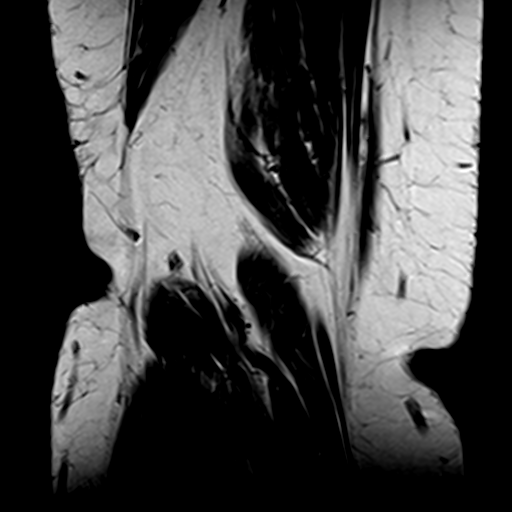
[im 30/30]
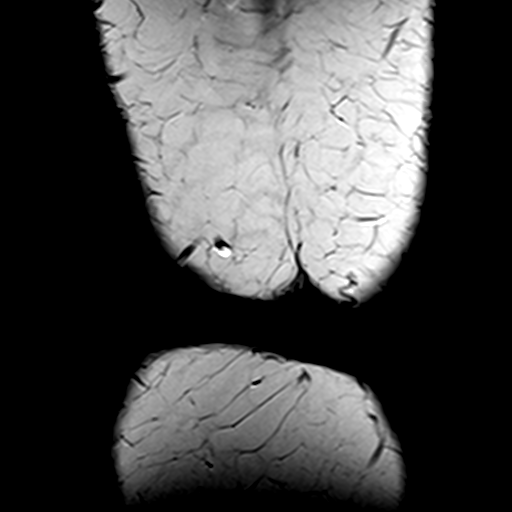

[Series 5: pdfs sag · sagittal · 3.0mm · 0.24mm/px · 3 of 29 slices shown]
[im 6/29]
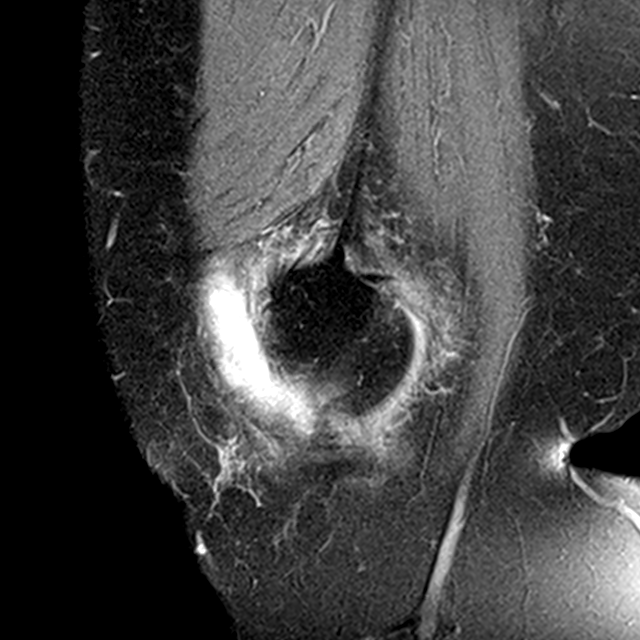
[im 17/29]
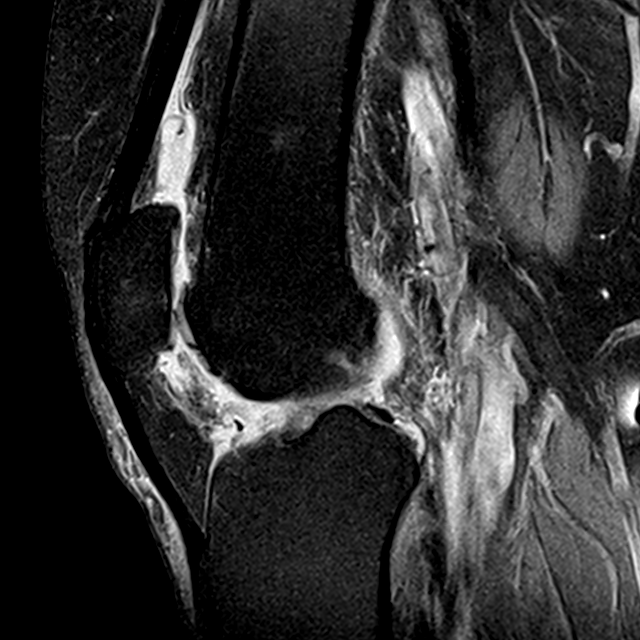
[im 29/29]
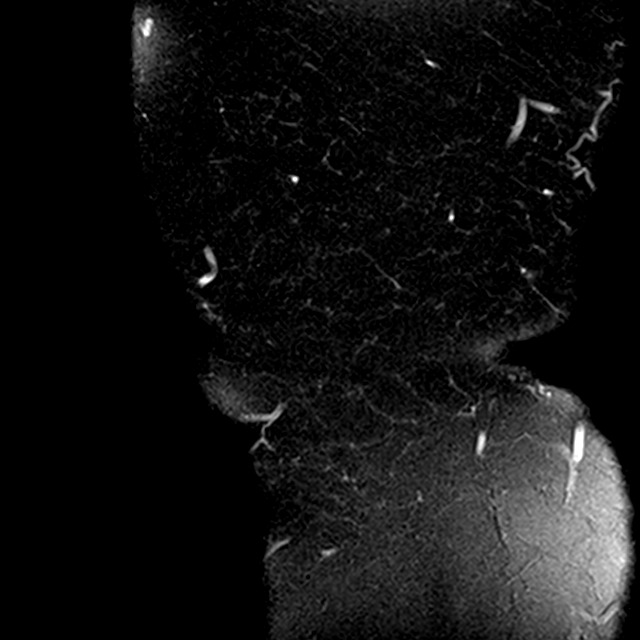

[Series 6: t2fs cor · coronal · 4.0mm · 0.30mm/px · 3 of 30 slices shown]
[im 6/30]
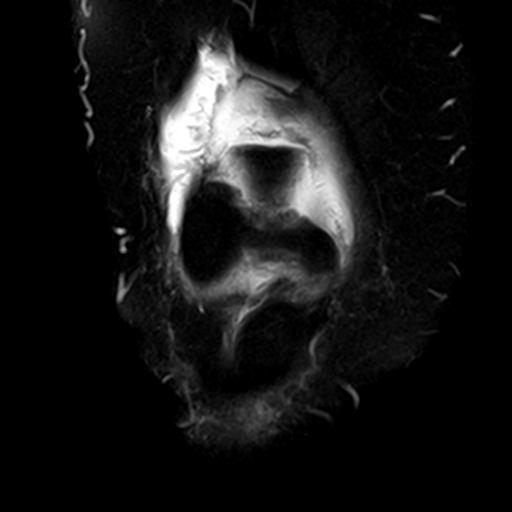
[im 18/30]
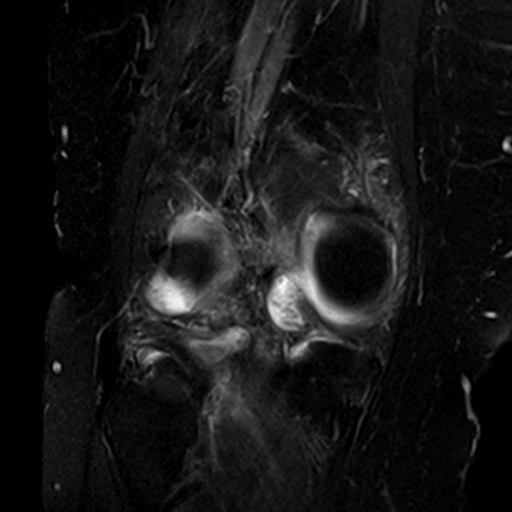
[im 30/30]
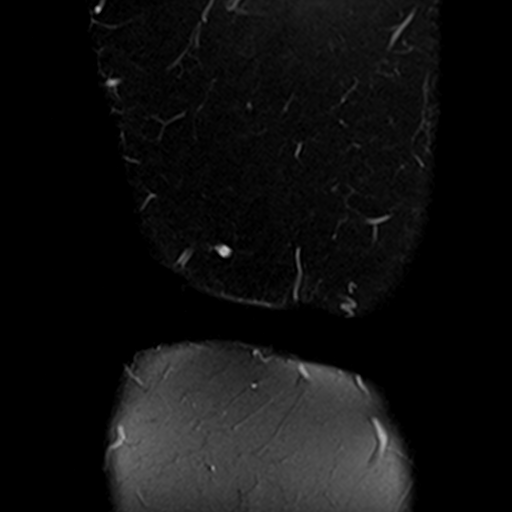

[16 of 40 positions shown; findings below may reference images not displayed]

FINDINGS: MENISCI

Medial meniscus:  Intact.

Lateral meniscus: The bulk of the anterior horn is not visualized
consistent with degenerative maceration. There is some fraying along
the free edge of the body.

LIGAMENTS

Cruciates:  Intact.

Collaterals:  Intact.

CARTILAGE

Patellofemoral: Cartilage loss is worst along the lateral patellar
facet in the mid and lower pole.

Medial:  Mildly to moderately degenerated.  No focal defect.

Lateral: Moderate to moderately severe thinned without focal defect.

Joint:  Moderate joint effusion.

Popliteal Fossa: Baker's cyst measures approximately 1.3 cm
transverse by 1 cm AP by 2.1 cm craniocaudal.

Extensor Mechanism:  Intact.

Bones: Osteophytosis is present about the knee. There is some
subchondral edema in the lateral patellar facet and about the
periphery of the lateral compartment.

Other: None.
IMPRESSION: Osteoarthritis about the knee is most advanced in the lateral and
patellofemoral compartments.

The anterior horn of the lateral meniscus is not visualized
consistent with degenerative maceration.

## 2021-09-13 DIAGNOSIS — Z789 Other specified health status: Secondary | ICD-10-CM | POA: Diagnosis not present

## 2021-09-13 DIAGNOSIS — E1165 Type 2 diabetes mellitus with hyperglycemia: Secondary | ICD-10-CM | POA: Diagnosis not present

## 2021-09-13 DIAGNOSIS — Z299 Encounter for prophylactic measures, unspecified: Secondary | ICD-10-CM | POA: Diagnosis not present

## 2021-09-13 DIAGNOSIS — R42 Dizziness and giddiness: Secondary | ICD-10-CM | POA: Diagnosis not present

## 2021-09-13 DIAGNOSIS — Z6832 Body mass index (BMI) 32.0-32.9, adult: Secondary | ICD-10-CM | POA: Diagnosis not present

## 2021-10-27 DIAGNOSIS — E1165 Type 2 diabetes mellitus with hyperglycemia: Secondary | ICD-10-CM | POA: Diagnosis not present

## 2021-10-27 DIAGNOSIS — Z6831 Body mass index (BMI) 31.0-31.9, adult: Secondary | ICD-10-CM | POA: Diagnosis not present

## 2021-10-27 DIAGNOSIS — E2839 Other primary ovarian failure: Secondary | ICD-10-CM | POA: Diagnosis not present

## 2021-10-27 DIAGNOSIS — Z299 Encounter for prophylactic measures, unspecified: Secondary | ICD-10-CM | POA: Diagnosis not present

## 2021-10-27 DIAGNOSIS — Z79899 Other long term (current) drug therapy: Secondary | ICD-10-CM | POA: Diagnosis not present

## 2021-10-27 DIAGNOSIS — Z789 Other specified health status: Secondary | ICD-10-CM | POA: Diagnosis not present

## 2021-10-27 DIAGNOSIS — Z0000 Encounter for general adult medical examination without abnormal findings: Secondary | ICD-10-CM | POA: Diagnosis not present

## 2021-10-27 DIAGNOSIS — Z1331 Encounter for screening for depression: Secondary | ICD-10-CM | POA: Diagnosis not present

## 2021-10-27 DIAGNOSIS — R5383 Other fatigue: Secondary | ICD-10-CM | POA: Diagnosis not present

## 2021-11-25 ENCOUNTER — Other Ambulatory Visit: Payer: Self-pay | Admitting: Nurse Practitioner

## 2021-11-25 DIAGNOSIS — E1165 Type 2 diabetes mellitus with hyperglycemia: Secondary | ICD-10-CM

## 2021-12-20 ENCOUNTER — Other Ambulatory Visit: Payer: Self-pay | Admitting: Internal Medicine

## 2021-12-20 DIAGNOSIS — Z139 Encounter for screening, unspecified: Secondary | ICD-10-CM

## 2021-12-23 DIAGNOSIS — E1165 Type 2 diabetes mellitus with hyperglycemia: Secondary | ICD-10-CM | POA: Diagnosis not present

## 2021-12-23 DIAGNOSIS — Z299 Encounter for prophylactic measures, unspecified: Secondary | ICD-10-CM | POA: Diagnosis not present

## 2021-12-23 DIAGNOSIS — N1831 Chronic kidney disease, stage 3a: Secondary | ICD-10-CM | POA: Diagnosis not present

## 2022-01-04 ENCOUNTER — Inpatient Hospital Stay: Admission: RE | Admit: 2022-01-04 | Payer: BC Managed Care – PPO | Source: Ambulatory Visit

## 2022-01-24 ENCOUNTER — Telehealth: Payer: Self-pay

## 2022-01-24 NOTE — Chronic Care Management (AMB) (Signed)
  Care Coordination  Note  01/24/2022 Name: Heather Leach MRN: [id number] DOB: Sep 01, 1955  Heather Leach is a 66 y.o. year old female who is a primary care patient of Chevis Pretty, [place]. I reached out to [name] by phone today to offer care coordination services.      Ms. Valtierra was given information about Care Coordination services today including:  The Care Coordination services include support from the care team which includes your Nurse Coordinator, Clinical Social Worker, or Pharmacist.  The Care Coordination team is here to help remove barriers to the health concerns and goals most important to you. Care Coordination services are voluntary and the patient may decline or stop services at any time by request to their care team member.   Patient did not agree to participate in care coordination services at this time.  Follow up plan: Patient declines further follow up or participation in care coordination services.   Noreene Larsson, [place],  [id number] Direct Dial: [phone] [email or web address]

## 2022-04-11 DIAGNOSIS — R079 Chest pain, unspecified: Secondary | ICD-10-CM | POA: Diagnosis not present

## 2022-04-11 DIAGNOSIS — Z299 Encounter for prophylactic measures, unspecified: Secondary | ICD-10-CM | POA: Diagnosis not present

## 2022-04-11 DIAGNOSIS — N1831 Chronic kidney disease, stage 3a: Secondary | ICD-10-CM | POA: Diagnosis not present

## 2022-04-11 DIAGNOSIS — E1165 Type 2 diabetes mellitus with hyperglycemia: Secondary | ICD-10-CM | POA: Diagnosis not present

## 2022-11-02 DIAGNOSIS — Z0000 Encounter for general adult medical examination without abnormal findings: Secondary | ICD-10-CM | POA: Diagnosis not present

## 2022-11-02 DIAGNOSIS — R5383 Other fatigue: Secondary | ICD-10-CM | POA: Diagnosis not present

## 2022-11-02 DIAGNOSIS — Z79899 Other long term (current) drug therapy: Secondary | ICD-10-CM | POA: Diagnosis not present

## 2022-11-02 DIAGNOSIS — E559 Vitamin D deficiency, unspecified: Secondary | ICD-10-CM | POA: Diagnosis not present

## 2022-11-02 DIAGNOSIS — Z299 Encounter for prophylactic measures, unspecified: Secondary | ICD-10-CM | POA: Diagnosis not present

## 2022-11-02 DIAGNOSIS — Z1339 Encounter for screening examination for other mental health and behavioral disorders: Secondary | ICD-10-CM | POA: Diagnosis not present

## 2022-11-02 DIAGNOSIS — Z7189 Other specified counseling: Secondary | ICD-10-CM | POA: Diagnosis not present

## 2022-11-02 DIAGNOSIS — R52 Pain, unspecified: Secondary | ICD-10-CM | POA: Diagnosis not present

## 2022-11-02 DIAGNOSIS — Z1331 Encounter for screening for depression: Secondary | ICD-10-CM | POA: Diagnosis not present

## 2022-11-02 DIAGNOSIS — Z6832 Body mass index (BMI) 32.0-32.9, adult: Secondary | ICD-10-CM | POA: Diagnosis not present

## 2022-11-02 DIAGNOSIS — E7800 Pure hypercholesterolemia, unspecified: Secondary | ICD-10-CM | POA: Diagnosis not present

## 2022-11-07 DIAGNOSIS — N1831 Chronic kidney disease, stage 3a: Secondary | ICD-10-CM | POA: Diagnosis not present

## 2022-11-07 DIAGNOSIS — E1165 Type 2 diabetes mellitus with hyperglycemia: Secondary | ICD-10-CM | POA: Diagnosis not present

## 2022-11-07 DIAGNOSIS — Z299 Encounter for prophylactic measures, unspecified: Secondary | ICD-10-CM | POA: Diagnosis not present

## 2022-11-14 DIAGNOSIS — E2839 Other primary ovarian failure: Secondary | ICD-10-CM | POA: Diagnosis not present

## 2022-11-27 DIAGNOSIS — M8580 Other specified disorders of bone density and structure, unspecified site: Secondary | ICD-10-CM | POA: Diagnosis not present

## 2022-11-27 DIAGNOSIS — E1169 Type 2 diabetes mellitus with other specified complication: Secondary | ICD-10-CM | POA: Diagnosis not present

## 2022-11-27 DIAGNOSIS — I10 Essential (primary) hypertension: Secondary | ICD-10-CM | POA: Diagnosis not present

## 2022-11-27 DIAGNOSIS — E785 Hyperlipidemia, unspecified: Secondary | ICD-10-CM | POA: Diagnosis not present

## 2022-11-27 DIAGNOSIS — R32 Unspecified urinary incontinence: Secondary | ICD-10-CM | POA: Diagnosis not present

## 2022-11-27 DIAGNOSIS — E669 Obesity, unspecified: Secondary | ICD-10-CM | POA: Diagnosis not present

## 2022-11-27 DIAGNOSIS — R42 Dizziness and giddiness: Secondary | ICD-10-CM | POA: Diagnosis not present

## 2022-12-29 DIAGNOSIS — E119 Type 2 diabetes mellitus without complications: Secondary | ICD-10-CM | POA: Diagnosis not present

## 2022-12-29 DIAGNOSIS — E1165 Type 2 diabetes mellitus with hyperglycemia: Secondary | ICD-10-CM | POA: Diagnosis not present

## 2022-12-29 DIAGNOSIS — M25512 Pain in left shoulder: Secondary | ICD-10-CM | POA: Diagnosis not present

## 2022-12-29 DIAGNOSIS — N1831 Chronic kidney disease, stage 3a: Secondary | ICD-10-CM | POA: Diagnosis not present

## 2022-12-29 DIAGNOSIS — Z299 Encounter for prophylactic measures, unspecified: Secondary | ICD-10-CM | POA: Diagnosis not present

## 2023-01-01 ENCOUNTER — Other Ambulatory Visit: Payer: Self-pay | Admitting: Nurse Practitioner

## 2023-01-01 DIAGNOSIS — Z1231 Encounter for screening mammogram for malignant neoplasm of breast: Secondary | ICD-10-CM

## 2023-01-02 ENCOUNTER — Other Ambulatory Visit: Payer: Self-pay | Admitting: Student

## 2023-01-02 ENCOUNTER — Ambulatory Visit
Admission: RE | Admit: 2023-01-02 | Discharge: 2023-01-02 | Disposition: A | Discharge status: Home / Self Care | Payer: PPO | Source: Ambulatory Visit | Attending: Nurse Practitioner | Admitting: Nurse Practitioner

## 2023-01-02 DIAGNOSIS — Z1231 Encounter for screening mammogram for malignant neoplasm of breast: Secondary | ICD-10-CM | POA: Diagnosis not present

## 2023-01-04 ENCOUNTER — Other Ambulatory Visit: Payer: Self-pay | Admitting: Student

## 2023-01-04 DIAGNOSIS — R928 Other abnormal and inconclusive findings on diagnostic imaging of breast: Secondary | ICD-10-CM

## 2023-01-11 ENCOUNTER — Ambulatory Visit
Admission: RE | Admit: 2023-01-11 | Discharge: 2023-01-11 | Disposition: A | Discharge status: Home / Self Care | Payer: PPO | Source: Ambulatory Visit | Attending: Student | Admitting: Student

## 2023-01-11 DIAGNOSIS — R928 Other abnormal and inconclusive findings on diagnostic imaging of breast: Secondary | ICD-10-CM

## 2023-01-11 DIAGNOSIS — N6325 Unspecified lump in the left breast, overlapping quadrants: Secondary | ICD-10-CM | POA: Diagnosis not present

## 2023-01-15 ENCOUNTER — Other Ambulatory Visit: Payer: Self-pay | Admitting: Student

## 2023-01-15 DIAGNOSIS — N63 Unspecified lump in unspecified breast: Secondary | ICD-10-CM

## 2023-05-07 ENCOUNTER — Encounter: Payer: Self-pay | Admitting: Internal Medicine

## 2023-06-05 DIAGNOSIS — N1831 Chronic kidney disease, stage 3a: Secondary | ICD-10-CM | POA: Diagnosis not present

## 2023-06-05 DIAGNOSIS — Z299 Encounter for prophylactic measures, unspecified: Secondary | ICD-10-CM | POA: Diagnosis not present

## 2023-06-05 DIAGNOSIS — R350 Frequency of micturition: Secondary | ICD-10-CM | POA: Diagnosis not present

## 2023-06-05 DIAGNOSIS — N390 Urinary tract infection, site not specified: Secondary | ICD-10-CM | POA: Diagnosis not present

## 2023-06-05 DIAGNOSIS — E1165 Type 2 diabetes mellitus with hyperglycemia: Secondary | ICD-10-CM | POA: Diagnosis not present

## 2023-07-05 DIAGNOSIS — Z299 Encounter for prophylactic measures, unspecified: Secondary | ICD-10-CM | POA: Diagnosis not present

## 2023-07-05 DIAGNOSIS — E1165 Type 2 diabetes mellitus with hyperglycemia: Secondary | ICD-10-CM | POA: Diagnosis not present

## 2023-07-05 DIAGNOSIS — N1831 Chronic kidney disease, stage 3a: Secondary | ICD-10-CM | POA: Diagnosis not present

## 2023-08-09 ENCOUNTER — Encounter: Payer: Self-pay | Admitting: Student

## 2023-08-09 DIAGNOSIS — Z299 Encounter for prophylactic measures, unspecified: Secondary | ICD-10-CM | POA: Diagnosis not present

## 2023-08-09 DIAGNOSIS — E7800 Pure hypercholesterolemia, unspecified: Secondary | ICD-10-CM | POA: Diagnosis not present

## 2023-08-09 DIAGNOSIS — N1831 Chronic kidney disease, stage 3a: Secondary | ICD-10-CM | POA: Diagnosis not present

## 2023-08-09 DIAGNOSIS — E1165 Type 2 diabetes mellitus with hyperglycemia: Secondary | ICD-10-CM | POA: Diagnosis not present

## 2023-08-09 DIAGNOSIS — E1122 Type 2 diabetes mellitus with diabetic chronic kidney disease: Secondary | ICD-10-CM | POA: Diagnosis not present

## 2023-08-15 ENCOUNTER — Other Ambulatory Visit: Payer: PPO

## 2023-08-23 DIAGNOSIS — N1831 Chronic kidney disease, stage 3a: Secondary | ICD-10-CM | POA: Diagnosis not present

## 2023-08-23 DIAGNOSIS — Z299 Encounter for prophylactic measures, unspecified: Secondary | ICD-10-CM | POA: Diagnosis not present

## 2023-08-23 DIAGNOSIS — E1165 Type 2 diabetes mellitus with hyperglycemia: Secondary | ICD-10-CM | POA: Diagnosis not present

## 2023-08-23 DIAGNOSIS — E1169 Type 2 diabetes mellitus with other specified complication: Secondary | ICD-10-CM | POA: Diagnosis not present

## 2023-08-23 DIAGNOSIS — E7800 Pure hypercholesterolemia, unspecified: Secondary | ICD-10-CM | POA: Diagnosis not present

## 2023-08-27 DIAGNOSIS — Z299 Encounter for prophylactic measures, unspecified: Secondary | ICD-10-CM | POA: Diagnosis not present

## 2023-08-27 DIAGNOSIS — R300 Dysuria: Secondary | ICD-10-CM | POA: Diagnosis not present

## 2023-08-27 DIAGNOSIS — N390 Urinary tract infection, site not specified: Secondary | ICD-10-CM | POA: Diagnosis not present

## 2023-08-27 DIAGNOSIS — N8110 Cystocele, unspecified: Secondary | ICD-10-CM | POA: Diagnosis not present

## 2023-09-12 ENCOUNTER — Other Ambulatory Visit: Payer: PPO

## 2023-09-28 ENCOUNTER — Telehealth: Payer: Self-pay

## 2023-09-28 NOTE — Progress Notes (Signed)
   09/28/2023  Patient ID: Heather Leach, female   DOB: [birth year], 68 y.o.   MRN: [id number]  Patient on adherence quality report as a triple fail for MAD, MAC, and MAH. I spoke with the pharmacy and verified the patient's fill history.    Per Dr.First claims history, patient is overdue for refills: Rosuvastatin 10 mg - last filled 06/05/23 for a 90-day supply.  Lisinopril 2.5 mg - last filled 06/05/23 for a 90-day supply.  Januvia 100 mg - last filled 04/26/23 for a 90-day supply.    *Per patient, she is no longer on Januvia, but now taking Ozempic, Lantus, and a prandial insulin. Cannot see insulin claims in Dr.First, but if processed through insurance, will exclude the patient from the MAD measure. Patient reports having Ozempic samples from PCP.   Will collaborate with PCP and patient to get medications refilled and reconciled.    Thank you for allowing pharmacy to be a part of this patient's care.   Harlon Flor, PharmD Clinical Pharmacist  [phone]

## 2023-10-03 ENCOUNTER — Other Ambulatory Visit: Payer: Self-pay

## 2023-10-03 DIAGNOSIS — Z9189 Other specified personal risk factors, not elsewhere classified: Secondary | ICD-10-CM

## 2023-10-03 DIAGNOSIS — Z79899 Other long term (current) drug therapy: Secondary | ICD-10-CM

## 2023-10-03 NOTE — Progress Notes (Addendum)
   10/03/2023 Name: Heather Leach MRN: [id number] DOB: [birth year]  No chief complaint on file.   Heather Leach is a 68 y.o. year old female who presented for a telephone visit.   They were referred to the pharmacist by a quality report for assistance in managing  Ozempic medication assistance .    Subjective:  Medication Access/Adherence  Current Pharmacy:  [facility] [address], [place] - [address] Heather Leach [address] [place] [address] Phone: [phone] Fax: [phone]    MAD:  Januvia  100 mg daily (patient self discontinue d/t improved numbers), Ozempic 0.25mg  once weekly (patient reports being on 0.25 mg weekly for 2-3 months d/t intolerance of 0.5 mg as previously noted during 08/10/2023), Lantus 12 units daily, Novolog SSI >150 u per patient instructions and patient has not needed to use Novolog no more than twice in two weeks.   - Patient should be removed from measure if insulin is billed through insurance.   Rosuvastatin and lisinopril (90-day supply) refilled on 06/04/24. Lantus Solostar last filled on 07/05/23 with $0 copay (140-day supply per Pharmacy); refill on file dated 08/09/23 but not yet picked up. Novolog filled on 08/09/23 via insurance with $0 copay; estimated 75-day supply based on maximum daily use. Patient reports improved blood sugar since self-discontinuing Januvia  1.5 months ago. Discussed with patient that given Januvia  works similarly, but not the same, as Ozempic to discuss with PCP to discontinue medication; as such there is no greater benefit of being on both medications simultaneously and the increase risk of side effects. Patient takes Ozempic on Sundays and plans to start 0.5 mg weekly and she was encouraged to do so.  No CGM; advised patient to discuss CGM with PCP due to insulin use and she has Medicare. Patient may qualify for [employer]o Nord[employer]'s Ozempic program based on reported income. Will follow up with Patient Advocate team for medication assistance.   Noted adherence issues; patient uses a pillbox to assist with medication management which she feels is helping. Message sent to clinic.   Alexandria Angel, PharmD Clinical Pharmacist Cell: [phone]     I need to message PCP

## 2023-10-16 ENCOUNTER — Telehealth: Payer: Self-pay

## 2023-10-16 ENCOUNTER — Other Ambulatory Visit (HOSPITAL_COMMUNITY): Payer: Self-pay

## 2023-10-16 ENCOUNTER — Other Ambulatory Visit: Payer: Self-pay

## 2023-10-16 DIAGNOSIS — Z9189 Other specified personal risk factors, not elsewhere classified: Secondary | ICD-10-CM

## 2023-10-16 NOTE — Telephone Encounter (Signed)
 Pharmacy Patient Advocate Encounter  Insurance verification completed.   The patient is insured through [facility] ADVANTAGE/RX ADVANCE   Ran test claim for Ozempic. Currently a quantity of 9ml is a 84 day supply and the co-pay is 0 . Patient has [employer] CSNP plan all diabetic meds are $0 copay  This test claim was processed through [facility]- copay amounts may vary at other pharmacies due to pharmacy/plan contracts, or as the patient moves through the different stages of their insurance plan.

## 2023-10-16 NOTE — Progress Notes (Signed)
   10/16/2023  Patient ID: Heather Leach, female   DOB: Mar 16, 1956, 68 y.o.   MRN: [id number]  Patient started Ozempic 0.5 mg on Sunday, 10/15/2023, and reported experiencing mild, transient nausea. Patient states she has four doses remaining. Patient reported that the pharmacy informed her insurance would not cover Ozempic, without submitting the prescription claim. According to the patient, samples were provided by her PCP prior to sending the prescription to the pharmacy to avoid a delay in care. A [facility] technician completed a test claim for Ozempic, and the result was $0 out of pocket. This pharmacist called [facility] to verify the cost and $0 copay for 0.5 mg at their pharmacy.  Thank you for allowing pharmacy to be a part of this patient's care. Alexandria Angel, PharmD Clinical Pharmacist Cell: [phone]

## 2023-11-08 NOTE — Progress Notes (Signed)
   11/08/2023  Patient ID: Heather Leach, female   DOB: 31-Dec-1955, 68 y.o.   MRN: [id number]    Per Dr. Anson Basta, patient picked up Ozempic on 10/21/2023 for 28 days supply for no copay and she does not need Ozempic samples.   I spoke with Heather Leach today regarding her Ozempic, and she confirmed that she picked it up in April. However, per [facility] and chart review, there is no record of her filling lisinopril or rosuvastatin, both of which she should be taking.  When discussing medication adherence, Heather Leach initially stated that she hadn't refilled them because they were 90-day supplies. I explained that the last fills were in December 20240and should be due. She then acknowledged there were adherence issues. Educated patient on importance of compliance. We discussed using a pillbox to improve compliance, and she was agreeable.  Heather Leach also reported that rosuvastatin caused muscle cramps. I offered to reach out to her PCP to explore adjusting the frequency, and she agreed. She was reminded of her upcoming PCP appointment on Nov 22, 2023. Per chart review, both lisinopril and rosuvastatin prescriptions are set to expire at the end of the month. I attempted to contact the [facility] pharmacy with the patient on the phone; however, the line was busy, and I was unable to assist in facilitating the medication refills at that time. Heather Leach stated that she would contact pharmacy later today.   We will collaborate with her PCP to determine if adjusting rosuvastatin is clinically appropriate. Initiate moderate intensity  statin with reduced frequency if prior  statin intolerance 1x weekly, #13, 3 refills   2x weekly, #26, 3 refills   3x weekly, #39, 3 refills      Alexandria Angel, PharmD Clinical Pharmacist Cell: [phone]

## 2023-11-22 DIAGNOSIS — Z79899 Other long term (current) drug therapy: Secondary | ICD-10-CM | POA: Diagnosis not present

## 2023-11-22 DIAGNOSIS — Z0000 Encounter for general adult medical examination without abnormal findings: Secondary | ICD-10-CM | POA: Diagnosis not present

## 2023-11-22 DIAGNOSIS — Z7189 Other specified counseling: Secondary | ICD-10-CM | POA: Diagnosis not present

## 2023-11-22 DIAGNOSIS — E559 Vitamin D deficiency, unspecified: Secondary | ICD-10-CM | POA: Diagnosis not present

## 2023-11-22 DIAGNOSIS — E1165 Type 2 diabetes mellitus with hyperglycemia: Secondary | ICD-10-CM | POA: Diagnosis not present

## 2023-11-22 DIAGNOSIS — R5383 Other fatigue: Secondary | ICD-10-CM | POA: Diagnosis not present

## 2023-11-22 DIAGNOSIS — Z6832 Body mass index (BMI) 32.0-32.9, adult: Secondary | ICD-10-CM | POA: Diagnosis not present

## 2023-11-22 DIAGNOSIS — Z299 Encounter for prophylactic measures, unspecified: Secondary | ICD-10-CM | POA: Diagnosis not present

## 2023-11-22 DIAGNOSIS — Z1331 Encounter for screening for depression: Secondary | ICD-10-CM | POA: Diagnosis not present

## 2024-02-18 DIAGNOSIS — H5203 Hypermetropia, bilateral: Secondary | ICD-10-CM | POA: Diagnosis not present

## 2024-02-18 DIAGNOSIS — H2513 Age-related nuclear cataract, bilateral: Secondary | ICD-10-CM | POA: Diagnosis not present

## 2024-02-26 DIAGNOSIS — N1831 Chronic kidney disease, stage 3a: Secondary | ICD-10-CM | POA: Diagnosis not present

## 2024-02-26 DIAGNOSIS — Z299 Encounter for prophylactic measures, unspecified: Secondary | ICD-10-CM | POA: Diagnosis not present

## 2024-02-26 DIAGNOSIS — E119 Type 2 diabetes mellitus without complications: Secondary | ICD-10-CM | POA: Diagnosis not present
